# Patient Record
Sex: Female | Born: 1945 | Race: Black or African American | Hispanic: No | Smoking: Former smoker
Health system: Southern US, Community
[De-identification: ages and names within clinical notes are randomized; demographics above are authoritative.]

## PROBLEM LIST (undated history)

## (undated) DIAGNOSIS — IMO0001 Reserved for inherently not codable concepts without codable children: Secondary | ICD-10-CM

## (undated) DIAGNOSIS — K219 Gastro-esophageal reflux disease without esophagitis: Secondary | ICD-10-CM

## (undated) DIAGNOSIS — I10 Essential (primary) hypertension: Secondary | ICD-10-CM

## (undated) DIAGNOSIS — I209 Angina pectoris, unspecified: Secondary | ICD-10-CM

## (undated) DIAGNOSIS — Z87898 Personal history of other specified conditions: Secondary | ICD-10-CM

## (undated) DIAGNOSIS — J45909 Unspecified asthma, uncomplicated: Secondary | ICD-10-CM

## (undated) HISTORY — PX: CARDIAC CATHETERIZATION: SHX172

## (undated) HISTORY — PX: EYE SURGERY: SHX253

## (undated) HISTORY — PX: TUBAL LIGATION: SHX77

---

## 2004-04-06 ENCOUNTER — Ambulatory Visit: Payer: Self-pay | Admitting: Cardiology

## 2005-04-21 ENCOUNTER — Ambulatory Visit: Payer: Self-pay

## 2006-01-08 ENCOUNTER — Emergency Department: Payer: Self-pay | Admitting: General Practice

## 2006-01-08 ENCOUNTER — Other Ambulatory Visit: Payer: Self-pay

## 2006-04-13 ENCOUNTER — Emergency Department: Payer: Self-pay | Admitting: Emergency Medicine

## 2006-05-11 ENCOUNTER — Ambulatory Visit: Payer: Self-pay

## 2007-05-16 ENCOUNTER — Ambulatory Visit: Payer: Self-pay

## 2008-04-22 ENCOUNTER — Emergency Department: Payer: Self-pay | Admitting: Emergency Medicine

## 2008-05-15 ENCOUNTER — Ambulatory Visit: Payer: Self-pay

## 2009-06-25 ENCOUNTER — Ambulatory Visit: Payer: Self-pay

## 2010-07-01 ENCOUNTER — Ambulatory Visit: Payer: Self-pay

## 2010-07-14 ENCOUNTER — Ambulatory Visit: Payer: Self-pay

## 2011-06-29 ENCOUNTER — Ambulatory Visit: Payer: Self-pay

## 2012-07-25 ENCOUNTER — Ambulatory Visit: Payer: Self-pay

## 2012-10-12 ENCOUNTER — Ambulatory Visit: Payer: Self-pay | Admitting: Unknown Physician Specialty

## 2013-07-18 ENCOUNTER — Ambulatory Visit: Payer: Self-pay | Admitting: Ophthalmology

## 2013-07-18 LAB — CBC WITH DIFFERENTIAL/PLATELET
BASOS PCT: 1.1 %
Basophil #: 0.1 10*3/uL (ref 0.0–0.1)
EOS PCT: 5.4 %
Eosinophil #: 0.4 10*3/uL (ref 0.0–0.7)
HCT: 41.7 % (ref 35.0–47.0)
HGB: 13.4 g/dL (ref 12.0–16.0)
LYMPHS ABS: 1.8 10*3/uL (ref 1.0–3.6)
Lymphocyte %: 25.9 %
MCH: 28.6 pg (ref 26.0–34.0)
MCHC: 32 g/dL (ref 32.0–36.0)
MCV: 89 fL (ref 80–100)
MONO ABS: 0.5 x10 3/mm (ref 0.2–0.9)
MONOS PCT: 7.8 %
Neutrophil #: 4.1 10*3/uL (ref 1.4–6.5)
Neutrophil %: 59.8 %
Platelet: 227 10*3/uL (ref 150–440)
RBC: 4.67 10*6/uL (ref 3.80–5.20)
RDW: 15 % — AB (ref 11.5–14.5)
WBC: 6.8 10*3/uL (ref 3.6–11.0)

## 2013-07-18 LAB — POTASSIUM: POTASSIUM: 3.5 mmol/L (ref 3.5–5.1)

## 2013-07-25 ENCOUNTER — Ambulatory Visit: Payer: Self-pay | Admitting: Ophthalmology

## 2013-08-23 ENCOUNTER — Ambulatory Visit: Payer: Self-pay

## 2014-07-20 NOTE — Op Note (Signed)
PATIENT NAME:  Monica Stewart, Monica Stewart MR#:  [id number] DATE OF BIRTH:  08/05/1945  DATE OF PROCEDURE:  07/25/2013  PROCEDURES PERFORMED: 1. Pars plana vitrectomy of the right eye.  2. Internal limiting membrane peel of the right eye.  3. Gas exchange of the right eye.   PREOPERATIVE DIAGNOSIS: Full-thickness macular hole.   POSTOPERATIVE DIAGNOSIS: Full-thickness macular hole.   ESTIMATED BLOOD LOSS: Less than 1 mL.   PRIMARY SURGEON: Coralee Rud, M.D.   ANESTHESIA: Retrobulbar block of the right eye with monitored anesthesia care.   COMPLICATIONS: None.   INDICATIONS FOR PROCEDURE: This is a patient who presented to my office with decreased vision in the right eye. Examination revealed a full thickness macular hole. Risks, benefits, and alternatives including infection, hemorrhage and retinal detachment were discussed with the patient. The patient wished to proceed.   DETAILS OF PROCEDURE: After informed consent was obtained, the patient was brought into the operative suite at [facility]. The patient was placed in supine position, was given a small dose of Alfenta and a retrobulbar block was performed on the right eye by the primary surgeon without any complications. The right eye was prepped and draped in sterile manner. After lid speculum was inserted, a 25-gauge trocar was placed inferotemporally through displaced conjunctiva in an oblique fashion 4 mm beyond the limbus. The infusion cannula was turned on and inserted through the trocar and secured in position with Steri-Strips. Two more trocars were placed in a similar fashion superotemporally and superonasally. Vitreous cutter and light pipe were introduced in the eye and a core vitrectomy was performed. The vitreous base was elevated off of the retina using suction. The vitreous face was removed and the peripheral vitreous was trimmed for 360 degrees with care taken to avoid hitting the crystalline lens.  Indocyanine green was injected onto the posterior pole and removed within 30 seconds. A complete internal limiting membrane peel was completed for 360 degrees around the fovea for a total diameter of two disk diameters. A scleral depressed exam was performed and no signs of any breaks, tears, or retinal detachment could be identified for 360 degrees. A complete air-fluid exchange was performed. Four minutes was allowed to elapse for dehydration of the vitreous base. This remnant fluid was then removed. An air gas exchange was performed with 20% SF6. The trocars were removed and two of them were noted to be leaky. These were closed using transconjunctival  6-0 plain gut sutures. The eye was pressurized with 20% SF6 to a pressure of 15 mmHg. Dexamethasone 5 mg was given into the inferior fornix and the lid speculum was removed. The eye was cleaned and TobraDex was placed in the eye. A patch and shield were placed over the eye and the patient was taken to postanesthesia care with instructions to remain face down.     ____________________________ Teresa Pelton. Starling Manns, MD mfa:sg D: 07/25/2013 08:16:59 ET T: 07/25/2013 08:41:21 ET JOB#: [id number]  cc: Teresa Pelton. Starling Manns, MD, <Dictator> Coralee Rud MD ELECTRONICALLY SIGNED 08/22/2013 9:32

## 2014-07-24 ENCOUNTER — Other Ambulatory Visit: Payer: Self-pay

## 2014-07-24 DIAGNOSIS — Z1231 Encounter for screening mammogram for malignant neoplasm of breast: Secondary | ICD-10-CM

## 2014-08-27 ENCOUNTER — Ambulatory Visit
Admission: RE | Admit: 2014-08-27 | Discharge: 2014-08-27 | Disposition: A | Discharge status: Home / Self Care | Payer: BLUE CROSS/BLUE SHIELD | Source: Ambulatory Visit | Attending: Infectious Diseases | Admitting: Infectious Diseases

## 2014-08-27 DIAGNOSIS — Z1231 Encounter for screening mammogram for malignant neoplasm of breast: Secondary | ICD-10-CM | POA: Insufficient documentation

## 2015-01-20 ENCOUNTER — Encounter: Payer: Self-pay | Admitting: *Deleted

## 2015-01-23 ENCOUNTER — Ambulatory Visit: Payer: BLUE CROSS/BLUE SHIELD | Admitting: Certified Registered Nurse Anesthetist

## 2015-01-23 ENCOUNTER — Encounter: Payer: Self-pay | Admitting: *Deleted

## 2015-01-23 ENCOUNTER — Ambulatory Visit
Admission: RE | Admit: 2015-01-23 | Discharge: 2015-01-23 | Disposition: A | Discharge status: Home / Self Care | Payer: BLUE CROSS/BLUE SHIELD | Source: Ambulatory Visit | Attending: Ophthalmology | Admitting: Ophthalmology

## 2015-01-23 ENCOUNTER — Encounter
Admission: RE | Disposition: A | Discharge status: Home / Self Care | Payer: Self-pay | Source: Ambulatory Visit | Attending: Ophthalmology

## 2015-01-23 DIAGNOSIS — R0602 Shortness of breath: Secondary | ICD-10-CM | POA: Diagnosis not present

## 2015-01-23 DIAGNOSIS — R0601 Orthopnea: Secondary | ICD-10-CM | POA: Insufficient documentation

## 2015-01-23 DIAGNOSIS — I209 Angina pectoris, unspecified: Secondary | ICD-10-CM | POA: Diagnosis not present

## 2015-01-23 DIAGNOSIS — J45909 Unspecified asthma, uncomplicated: Secondary | ICD-10-CM | POA: Diagnosis not present

## 2015-01-23 DIAGNOSIS — K219 Gastro-esophageal reflux disease without esophagitis: Secondary | ICD-10-CM | POA: Diagnosis not present

## 2015-01-23 DIAGNOSIS — H2511 Age-related nuclear cataract, right eye: Secondary | ICD-10-CM | POA: Insufficient documentation

## 2015-01-23 DIAGNOSIS — I10 Essential (primary) hypertension: Secondary | ICD-10-CM | POA: Diagnosis not present

## 2015-01-23 DIAGNOSIS — Z87891 Personal history of nicotine dependence: Secondary | ICD-10-CM | POA: Insufficient documentation

## 2015-01-23 DIAGNOSIS — R011 Cardiac murmur, unspecified: Secondary | ICD-10-CM | POA: Diagnosis not present

## 2015-01-23 DIAGNOSIS — J40 Bronchitis, not specified as acute or chronic: Secondary | ICD-10-CM | POA: Insufficient documentation

## 2015-01-23 HISTORY — DX: Angina pectoris, unspecified: I20.9

## 2015-01-23 HISTORY — DX: Gastro-esophageal reflux disease without esophagitis: K21.9

## 2015-01-23 HISTORY — PX: CATARACT EXTRACTION W/PHACO: SHX586

## 2015-01-23 HISTORY — DX: Unspecified asthma, uncomplicated: J45.909

## 2015-01-23 HISTORY — DX: Personal history of other specified conditions: Z87.898

## 2015-01-23 HISTORY — DX: Reserved for inherently not codable concepts without codable children: IMO0001

## 2015-01-23 SURGERY — PHACOEMULSIFICATION, CATARACT, WITH IOL INSERTION
Anesthesia: Monitor Anesthesia Care | Laterality: Right

## 2015-01-23 MED ORDER — PHENYLEPHRINE HCL 10 % OP SOLN
1.0000 [drp] | OPHTHALMIC | Status: AC | PRN
  Administered 2015-01-23 (×4): 1 [drp] via OPHTHALMIC

## 2015-01-23 MED ORDER — LIDOCAINE HCL (PF) 4 % IJ SOLN
INTRAMUSCULAR | Status: AC
  Filled 2015-01-23: qty 5

## 2015-01-23 MED ORDER — TETRACAINE HCL 0.5 % OP SOLN
1.0000 [drp] | OPHTHALMIC | Status: DC | PRN
  Administered 2015-01-23: 1 [drp] via OPHTHALMIC

## 2015-01-23 MED ORDER — TETRACAINE HCL 0.5 % OP SOLN
OPHTHALMIC | Status: AC
  Administered 2015-01-23: 1 [drp] via OPHTHALMIC
  Filled 2015-01-23: qty 2

## 2015-01-23 MED ORDER — NA HYALUR & NA CHOND-NA HYALUR 0.55-0.5 ML IO KIT
PACK | INTRAOCULAR | Status: AC
  Filled 2015-01-23: qty 1.05

## 2015-01-23 MED ORDER — CEFUROXIME OPHTHALMIC INJECTION 1 MG/0.1 ML
INJECTION | OPHTHALMIC | Status: DC | PRN
  Administered 2015-01-23: 0.1 mL via INTRACAMERAL

## 2015-01-23 MED ORDER — MIDAZOLAM HCL 2 MG/2ML IJ SOLN
INTRAMUSCULAR | Status: DC | PRN
  Administered 2015-01-23: 1 mg via INTRAVENOUS

## 2015-01-23 MED ORDER — SODIUM CHLORIDE 0.9 % IV SOLN
INTRAVENOUS | Status: DC
  Administered 2015-01-23: 08:00:00 via INTRAVENOUS

## 2015-01-23 MED ORDER — CYCLOPENTOLATE HCL 2 % OP SOLN
OPHTHALMIC | Status: AC
  Administered 2015-01-23: 1 [drp] via OPHTHALMIC
  Filled 2015-01-23: qty 2

## 2015-01-23 MED ORDER — CYCLOPENTOLATE HCL 2 % OP SOLN
1.0000 [drp] | OPHTHALMIC | Status: AC | PRN
Start: 2015-01-23 — End: 2015-01-23
  Administered 2015-01-23 (×4): 1 [drp] via OPHTHALMIC

## 2015-01-23 MED ORDER — BALANCED SALT IO SOLN
INTRAOCULAR | Status: DC | PRN
  Administered 2015-01-23: 200 mL via INTRAOCULAR

## 2015-01-23 MED ORDER — MOXIFLOXACIN HCL 0.5 % OP SOLN
1.0000 [drp] | OPHTHALMIC | Status: AC | PRN
  Administered 2015-01-23 (×3): 1 [drp] via OPHTHALMIC

## 2015-01-23 MED ORDER — PHENYLEPHRINE HCL 10 % OP SOLN
OPHTHALMIC | Status: AC
  Administered 2015-01-23: 1 [drp] via OPHTHALMIC
  Filled 2015-01-23: qty 5

## 2015-01-23 MED ORDER — MOXIFLOXACIN HCL 0.5 % OP SOLN
OPHTHALMIC | Status: AC
  Administered 2015-01-23: 1 [drp] via OPHTHALMIC
  Filled 2015-01-23: qty 3

## 2015-01-23 MED ORDER — FENTANYL CITRATE (PF) 100 MCG/2ML IJ SOLN
INTRAMUSCULAR | Status: DC | PRN
  Administered 2015-01-23 (×2): 25 ug via INTRAVENOUS

## 2015-01-23 MED ORDER — LIDOCAINE HCL (PF) 4 % IJ SOLN
INTRAOCULAR | Status: DC | PRN
  Administered 2015-01-23: .5 mL via OPHTHALMIC

## 2015-01-23 MED ORDER — MOXIFLOXACIN HCL 0.5 % OP SOLN
OPHTHALMIC | Status: DC | PRN
  Administered 2015-01-23: 2 [drp] via OPHTHALMIC

## 2015-01-23 MED ORDER — EPINEPHRINE HCL 1 MG/ML IJ SOLN
INTRAMUSCULAR | Status: AC
  Filled 2015-01-23: qty 1

## 2015-01-23 MED ORDER — NA HYALUR & NA CHOND-NA HYALUR 0.4-0.35 ML IO KIT
PACK | INTRAOCULAR | Status: DC | PRN
  Administered 2015-01-23: 1 mL via INTRAOCULAR

## 2015-01-23 SURGICAL SUPPLY — 22 items
CANNULA ANT/CHMB 27GA (MISCELLANEOUS) ×3 IMPLANT
CUP MEDICINE 2OZ PLAST GRAD ST (MISCELLANEOUS) ×3 IMPLANT
GLOVE BIO SURGEON STRL SZ7 (GLOVE) ×3 IMPLANT
GLOVE SURG LX 6.5 MICRO (GLOVE) ×4
GLOVE SURG LX STRL 6.5 MICRO (GLOVE) ×2 IMPLANT
GOWN STRL REUS W/ TWL LRG LVL3 (GOWN DISPOSABLE) ×2 IMPLANT
GOWN STRL REUS W/TWL LRG LVL3 (GOWN DISPOSABLE) ×4
LENS IOL ACRSF IQ PC 23.5 (Intraocular Lens) ×1 IMPLANT
LENS IOL ACRYSOF IQ POST 23.5 (Intraocular Lens) ×3 IMPLANT
NEEDLE FILTER BLUNT 18X 1/2SAF (NEEDLE) ×2
NEEDLE FILTER BLUNT 18X1 1/2 (NEEDLE) ×1 IMPLANT
PACK CATARACT (MISCELLANEOUS) ×3 IMPLANT
PACK CATARACT BRASINGTON LX (MISCELLANEOUS) ×3 IMPLANT
PACK EYE AFTER SURG (MISCELLANEOUS) ×3 IMPLANT
SOL BSS BAG (MISCELLANEOUS) ×3
SOL PREP PVP 2OZ (MISCELLANEOUS) ×3
SOLUTION BSS BAG (MISCELLANEOUS) ×1 IMPLANT
SOLUTION PREP PVP 2OZ (MISCELLANEOUS) ×1 IMPLANT
SYR 3ML LL SCALE MARK (SYRINGE) ×6 IMPLANT
SYR TB 1ML 27GX1/2 LL (SYRINGE) ×3 IMPLANT
WATER STERILE IRR 1000ML POUR (IV SOLUTION) ×3 IMPLANT
WIPE NON LINTING 3.25X3.25 (MISCELLANEOUS) ×3 IMPLANT

## 2015-01-23 NOTE — Op Note (Signed)
  01/23/2015  PRE-OP DIAGNOSIS: Cataract (ICD-10 H25.11) Nuclear sclerotic catarct, RIGHT EYE  Post operative diagnosis: Cataract (ICD-10 H25.11) Nuclear sclerotic cataract, RIGHT EYE  Procedure: Phacoemulsification with introcular lens [id number]([id number])   SURGEON: Surgeon(s) and Role:    * Lyla Glassing, MD - Primary  ANESTHESIA: Choice   ESTIMATED BLOOD LOSS: MINIMAL  COMPLICATIONS: None  OPERATIVE DESCRIPTION:   Therapeutic options were discussed with the patient preoperatively, including a discussion of risks and benefits of surgery.  Informed consent was obtained. A dilated fundus exam was performed within 6 months.   The patient was premedicated and brought to the operating room and placed on the operating table in the supine position.  Topical tetracaine was instilled.  After adequate anesthesia, the patient was prepped and draped in the usual fashion.  A wire lid speculum was inserted and the microscope was positioned.  A sideport was used to create a paracentesis site and a mixture of preservative-free lidocaine, BSS, and epinephrine was was instilled into the anterior chamber, followed by viscoelastic.  A clear corneal incision was created using a keratome blade.  Capsulorrhexis was then performed.  In situ phacoemulsification was performed.  Cortical material was removed with the irrigation-aspiration unit.  Viscoelastic was instilled to open the capsular bag.  A posterior chamber intraocular lens, model 23.5 diopters, was inserted and positioned.  Irrigation-aspiration was used to remove all viscoelastic. Intracameral cefuroxime was injected into the eye. Wounds were checked for leakage and confirmed to be secure.  Lid speculum was removed and a shield was placed over the eye.  Patient was returned to the recovery room in stable condition. IMPLANTS:   Implant Name Type Inv. Item Serial No. Manufacturer Lot No. LRB No. Used  IMPLANT LENS - [id number] Intraocular Lens IMPLANT LENS  [id number] ALCON   Right 1     Postoperative care and discharge medication counseling was discussed with the patient or the parents prior to discharge

## 2015-01-23 NOTE — Discharge Instructions (Signed)
Eye Surgery Discharge Instructions  Expect mild scratchy sensation or mild soreness. DO NOT RUB YOUR EYE!  The day of surgery:  Minimal physical activity, but bed rest is not required  No reading, computer work, or close hand work  No bending, lifting, or straining.  May watch TV  For 24 hours:  No driving, legal decisions, or alcoholic beverages  Safety precautions  Eat anything you prefer: It is better to start with liquids, then soup then solid foods.  _____ Eye patch should be worn until postoperative exam tomorrow.  ____ Solar shield eyeglasses should be worn for comfort in the sunlight/patch while sleeping  Resume all regular medications including aspirin or Coumadin if these were discontinued prior to surgery. You may shower, bathe, shave, or wash your hair. Tylenol may be taken for mild discomfort.  Call your doctor if you experience significant pain, nausea, or vomiting, fever > 101 or other signs of infection. [phone] or [phone] Specific instructions:  Follow-up Information    Follow up with Lyla Glassing, MD In 1 day.   Specialty:  Ophthalmology   Why:  For wound re-check @ 9:40   Contact information:   [address] [place] [address] [phone]

## 2015-01-23 NOTE — Anesthesia Procedure Notes (Addendum)
Procedure Name: MAC Performed by: Demetrius Charity Pre-anesthesia Checklist: Patient identified, Suction available, Emergency Drugs available, Patient being monitored and Timeout performed Preoxygenation: O2 via blowby-pt states that she cannot breathe with nasal cannula.

## 2015-01-23 NOTE — H&P (Signed)
The history and physical was faxed to the hospital. The history and physical was reviewed by me and no changes have occurred.   

## 2015-01-23 NOTE — Anesthesia Postprocedure Evaluation (Signed)
  Anesthesia Post-op Note  Patient: [name]  Procedure(s) Performed: Procedure(s) with comments: CATARACT EXTRACTION PHACO AND INTRAOCULAR LENS PLACEMENT (IOC) (Right) - [place]            1.09 AP             17.7 CDE         12.16 casette lot # [id number]  Anesthesia type:MAC  Patient location: PACU  Post pain: Pain level controlled  Post assessment: Post-op Vital signs reviewed, Patient's Cardiovascular Status Stable, Respiratory Function Stable, Patent Airway and No signs of Nausea or vomiting  Post vital signs: Reviewed and stable  Last Vitals:  Filed Vitals:   01/23/15 1034  BP: 151/90  Pulse: 92  Temp: 36.8 C  Resp: 16    Level of consciousness: awake, alert  and patient cooperative  Complications: No apparent anesthesia complications

## 2015-01-23 NOTE — Anesthesia Postprocedure Evaluation (Signed)
  Anesthesia Post-op Note  Patient: LEILANIE RAUDA  Procedure(s) Performed: Procedure(s) with comments: CATARACT EXTRACTION PHACO AND INTRAOCULAR LENS PLACEMENT (IOC) (Right) - [place]            1.09 AP             17.7 CDE         12.16 casette lot # [id number]  Anesthesia type:MAC  Patient location: PACU  Post pain: Pain level controlled  Post assessment: Post-op Vital signs reviewed, Patient's Cardiovascular Status Stable, Respiratory Function Stable, Patent Airway and No signs of Nausea or vomiting  Post vital signs: Reviewed and stable  Last Vitals:  Filed Vitals:   01/23/15 1034  BP: 151/90  Pulse: 92  Temp: 36.8 C  Resp: 16    Level of consciousness: awake, alert  and patient cooperative  Complications: No apparent anesthesia complications

## 2015-01-23 NOTE — Transfer of Care (Signed)
Immediate Anesthesia Transfer of Care Note  Patient: Monica Stewart  Procedure(s) Performed: Procedure(s) with comments: CATARACT EXTRACTION PHACO AND INTRAOCULAR LENS PLACEMENT (IOC) (Right) - [place]            1.09 AP             17.7 CDE         12.16 casette lot # [id number]  Patient Location: PACU  Anesthesia Type:MAC  Level of Consciousness: awake, alert  and oriented  Airway & Oxygen Therapy: Patient Spontanous Breathing  Post-op Assessment: Report given to RN and Post -op Vital signs reviewed and stable  Post vital signs: Reviewed and stable  Last Vitals:  Filed Vitals:   01/23/15 1006  BP: 163/90  Pulse: 89  Temp: 36.8 C  Resp: 16    Complications: No apparent anesthesia complications

## 2015-01-23 NOTE — Anesthesia Preprocedure Evaluation (Signed)
Anesthesia Evaluation  Patient identified by MRN, date of birth, ID band Patient awake    Airway Mallampati: I  TM Distance: >3 FB     Dental  (+) Lower Dentures, Upper Dentures   Pulmonary shortness of breath and with exertion, asthma , former smoker,    breath sounds clear to auscultation       Cardiovascular  Rhythm:Regular Rate:Normal     Neuro/Psych    GI/Hepatic Medicated,  Endo/Other    Renal/GU      Musculoskeletal   Abdominal (+)  Abdomen: soft.    Peds  Hematology   Anesthesia Other Findings   Reproductive/Obstetrics                             Anesthesia Physical Anesthesia Plan  ASA: II  Anesthesia Plan: MAC   Post-op Pain Management:    Induction:   Airway Management Planned: Nasal Cannula  Additional Equipment:   Intra-op Plan:   Post-operative Plan:   Informed Consent: I have reviewed the patients History and Physical, chart, labs and discussed the procedure including the risks, benefits and alternatives for the proposed anesthesia with the patient or authorized representative who has indicated his/her understanding and acceptance.     Plan Discussed with: CRNA and Surgeon  Anesthesia Plan Comments:         Anesthesia Quick Evaluation

## 2015-07-16 ENCOUNTER — Other Ambulatory Visit: Payer: Self-pay | Admitting: Infectious Diseases

## 2015-07-16 DIAGNOSIS — Z1231 Encounter for screening mammogram for malignant neoplasm of breast: Secondary | ICD-10-CM

## 2015-09-02 ENCOUNTER — Other Ambulatory Visit: Payer: Self-pay | Admitting: Infectious Diseases

## 2015-09-02 ENCOUNTER — Ambulatory Visit
Admission: RE | Admit: 2015-09-02 | Discharge: 2015-09-02 | Disposition: A | Discharge status: Home / Self Care | Payer: BLUE CROSS/BLUE SHIELD | Source: Ambulatory Visit | Attending: Infectious Diseases | Admitting: Infectious Diseases

## 2015-09-02 DIAGNOSIS — Z1231 Encounter for screening mammogram for malignant neoplasm of breast: Secondary | ICD-10-CM | POA: Insufficient documentation

## 2015-09-02 DIAGNOSIS — R928 Other abnormal and inconclusive findings on diagnostic imaging of breast: Secondary | ICD-10-CM | POA: Diagnosis not present

## 2015-09-08 ENCOUNTER — Other Ambulatory Visit: Payer: Self-pay | Admitting: Infectious Diseases

## 2015-09-08 DIAGNOSIS — R928 Other abnormal and inconclusive findings on diagnostic imaging of breast: Secondary | ICD-10-CM

## 2015-09-16 ENCOUNTER — Ambulatory Visit
Admission: RE | Admit: 2015-09-16 | Discharge: 2015-09-16 | Disposition: A | Discharge status: Home / Self Care | Payer: BLUE CROSS/BLUE SHIELD | Source: Ambulatory Visit | Attending: Infectious Diseases | Admitting: Infectious Diseases

## 2015-09-16 DIAGNOSIS — N63 Unspecified lump in breast: Secondary | ICD-10-CM | POA: Diagnosis present

## 2015-09-16 DIAGNOSIS — R928 Other abnormal and inconclusive findings on diagnostic imaging of breast: Secondary | ICD-10-CM

## 2015-09-17 ENCOUNTER — Other Ambulatory Visit: Payer: Self-pay | Admitting: Infectious Diseases

## 2015-09-17 DIAGNOSIS — N6320 Unspecified lump in the left breast, unspecified quadrant: Secondary | ICD-10-CM

## 2015-09-23 ENCOUNTER — Ambulatory Visit
Admission: RE | Admit: 2015-09-23 | Discharge: 2015-09-23 | Disposition: A | Discharge status: Home / Self Care | Payer: BLUE CROSS/BLUE SHIELD | Source: Ambulatory Visit | Attending: Infectious Diseases | Admitting: Infectious Diseases

## 2015-09-23 DIAGNOSIS — N6082 Other benign mammary dysplasias of left breast: Secondary | ICD-10-CM | POA: Diagnosis not present

## 2015-09-23 DIAGNOSIS — N63 Unspecified lump in breast: Secondary | ICD-10-CM | POA: Diagnosis present

## 2015-09-23 DIAGNOSIS — N6320 Unspecified lump in the left breast, unspecified quadrant: Secondary | ICD-10-CM

## 2015-09-23 HISTORY — PX: BREAST BIOPSY: SHX20

## 2015-09-24 LAB — SURGICAL PATHOLOGY

## 2016-07-12 ENCOUNTER — Other Ambulatory Visit: Payer: Self-pay | Admitting: Infectious Diseases

## 2016-07-12 DIAGNOSIS — R1032 Left lower quadrant pain: Secondary | ICD-10-CM

## 2016-08-10 ENCOUNTER — Other Ambulatory Visit: Payer: Self-pay | Admitting: Infectious Diseases

## 2016-08-10 DIAGNOSIS — Z1231 Encounter for screening mammogram for malignant neoplasm of breast: Secondary | ICD-10-CM

## 2016-09-03 ENCOUNTER — Ambulatory Visit
Admission: RE | Admit: 2016-09-03 | Discharge: 2016-09-03 | Disposition: A | Discharge status: Home / Self Care | Payer: BLUE CROSS/BLUE SHIELD | Source: Ambulatory Visit | Attending: Infectious Diseases | Admitting: Infectious Diseases

## 2016-09-03 DIAGNOSIS — Z1231 Encounter for screening mammogram for malignant neoplasm of breast: Secondary | ICD-10-CM | POA: Insufficient documentation

## 2017-07-20 ENCOUNTER — Other Ambulatory Visit: Payer: Self-pay | Admitting: Infectious Diseases

## 2017-07-20 DIAGNOSIS — G8929 Other chronic pain: Secondary | ICD-10-CM

## 2017-07-20 DIAGNOSIS — R1032 Left lower quadrant pain: Principal | ICD-10-CM

## 2017-08-16 IMAGING — MG MM DIGITAL SCREENING BILAT W/ TOMO W/ CAD
9 of 12 series · 9 of 28 positions shown · non-contrast
Comparison: Previous exam(s).

CLINICAL DATA: Screening.

EXAM:
2D DIGITAL SCREENING BILATERAL MAMMOGRAM WITH CAD AND ADJUNCT TOMO

[L MLO synth-2D]
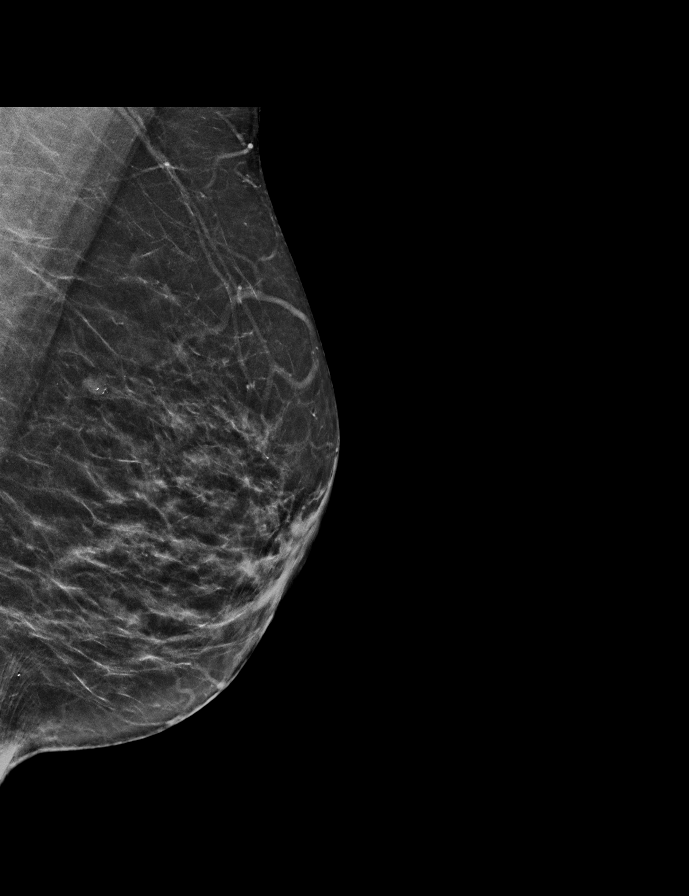

[L MLO]
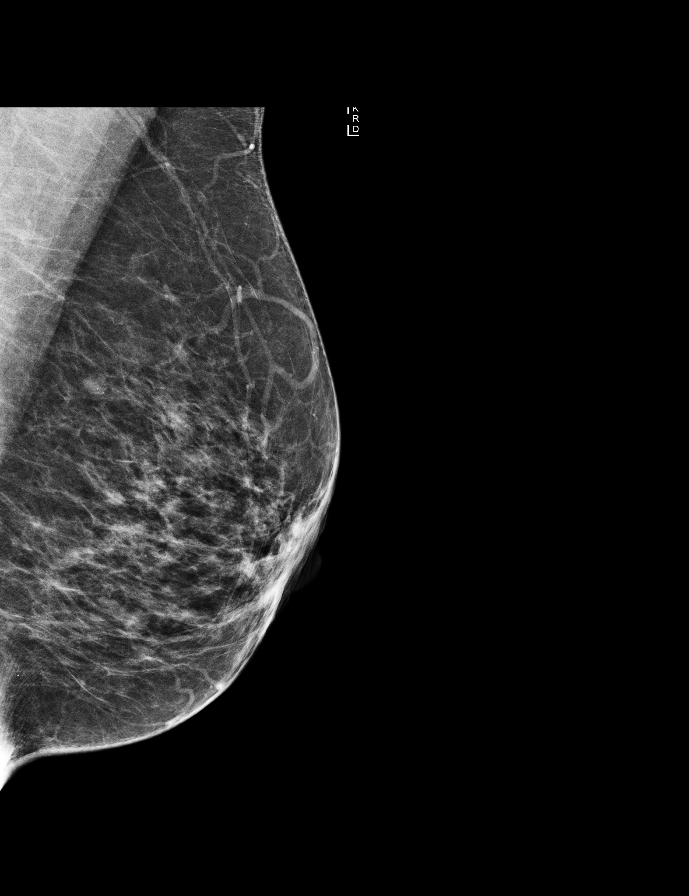

[L CC synth-2D]
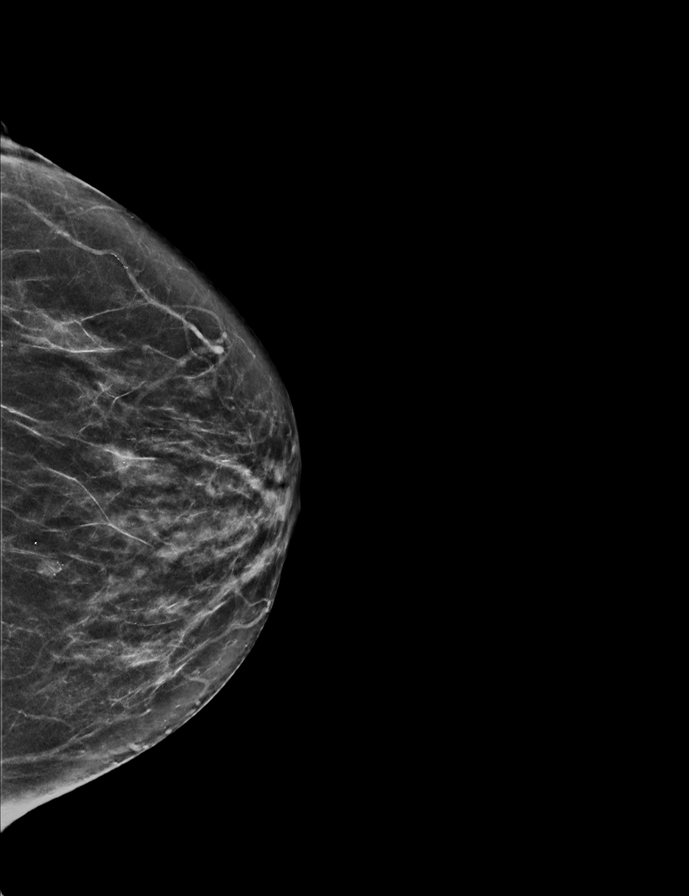

[R MLO synth-2D]
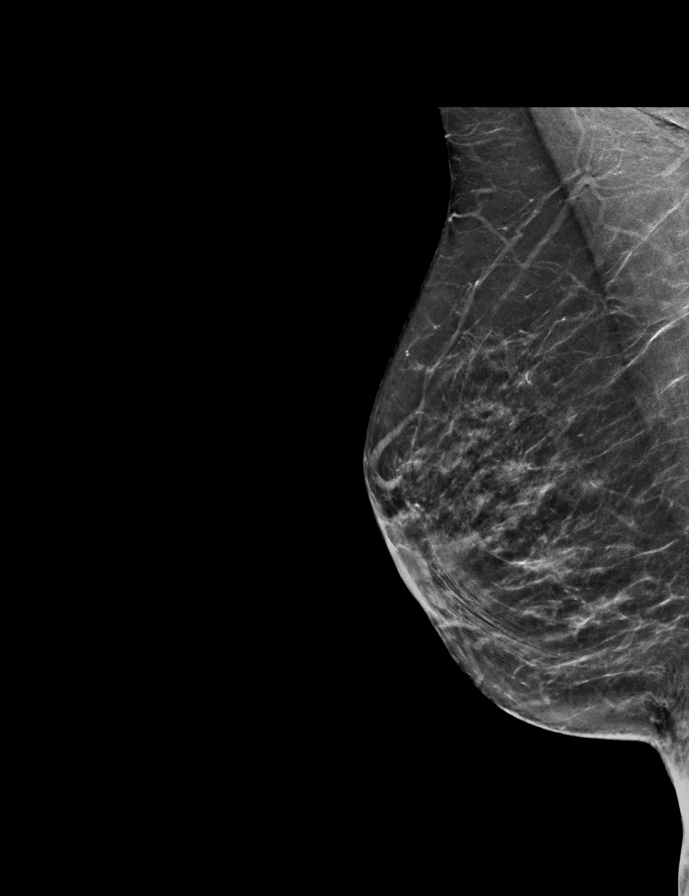

[R CC]
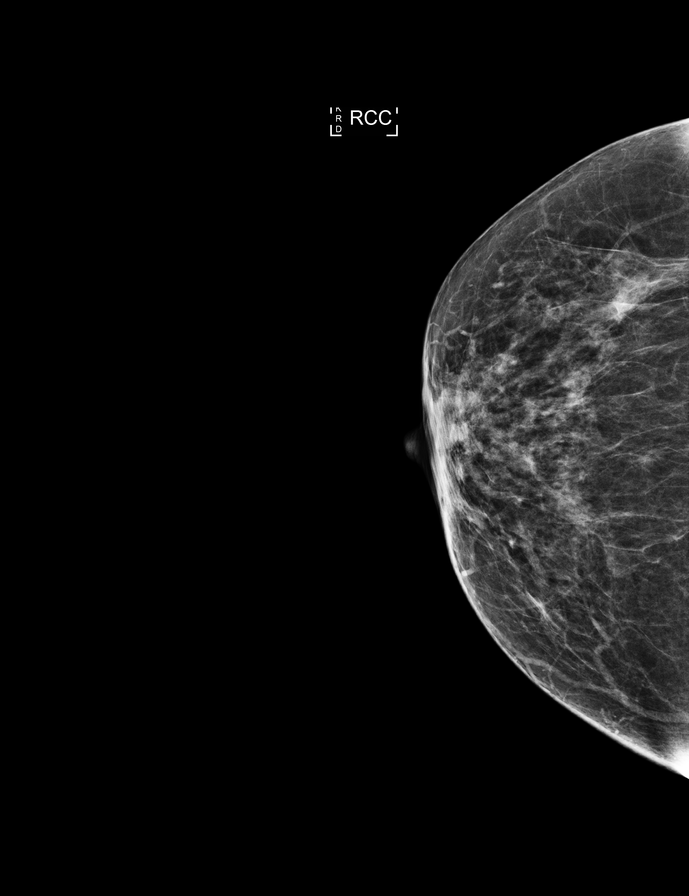

[R CC synth-2D]
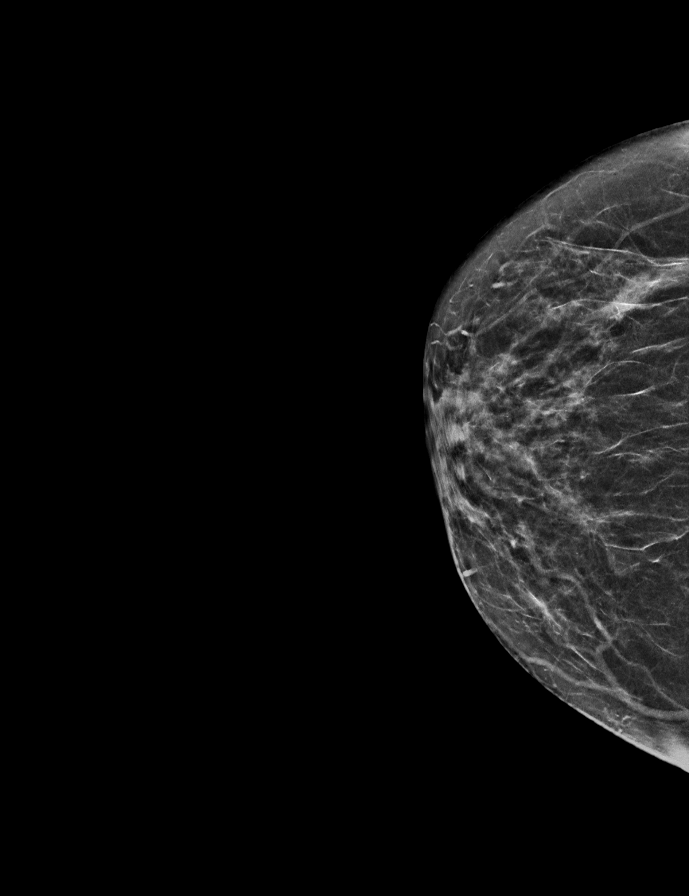

[R MLO]
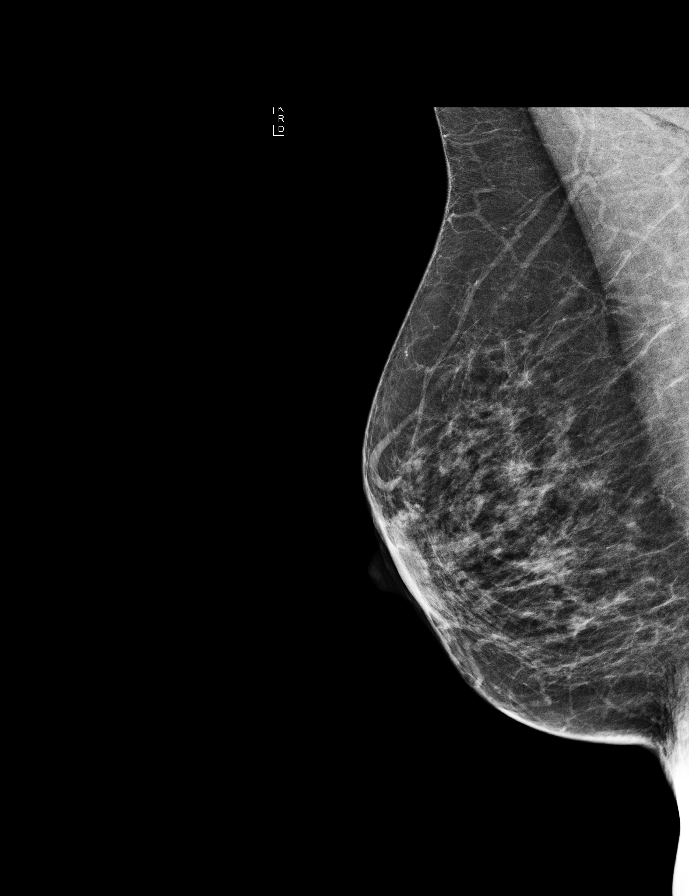

[L CC]
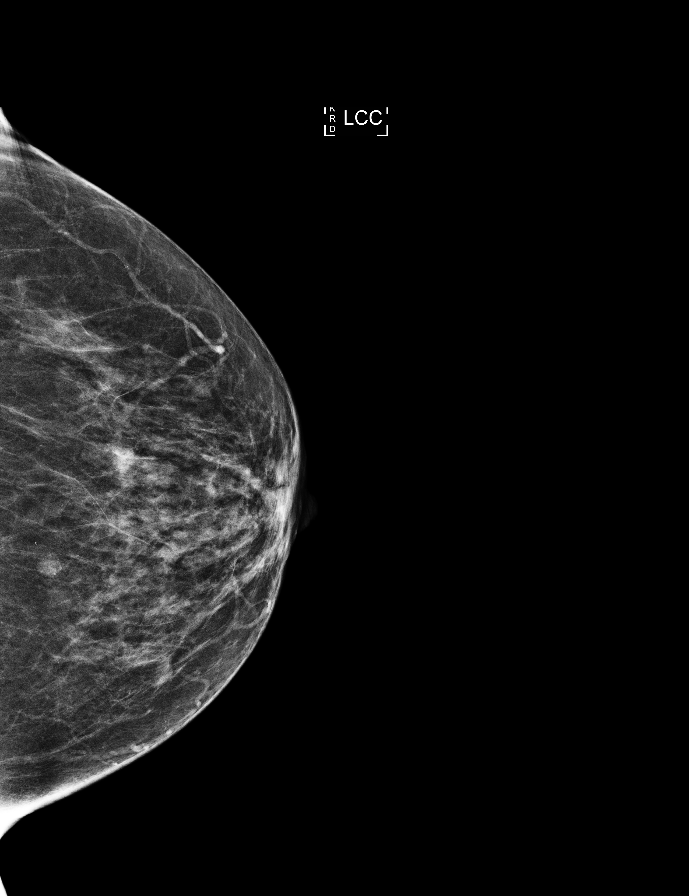

[L MLO tomo · tomo slice 27/54.0]
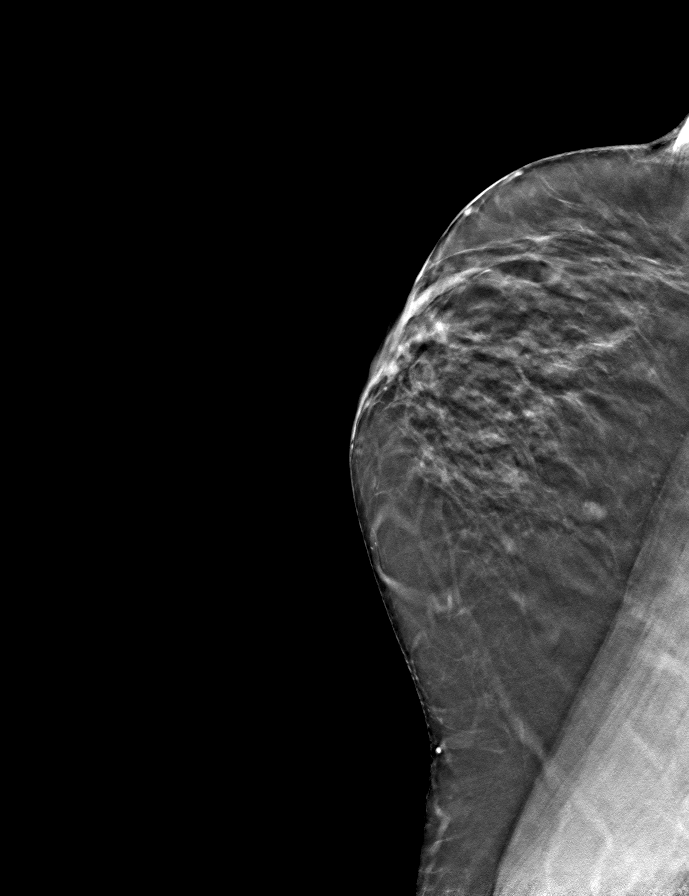

[9 of 28 positions shown; findings below may reference images not displayed]

ACR Breast Density Category b: There are scattered areas of
fibroglandular density.
FINDINGS: In the left breast, a possible mass warrants further evaluation. In
the right breast, no findings suspicious for malignancy.

Images were processed with CAD.
IMPRESSION: Further evaluation is suggested for possible mass in the left
breast.

RECOMMENDATION:
Diagnostic mammogram and possibly ultrasound of the left breast.
(Code:[id number])

The patient will be contacted regarding the findings, and additional
imaging will be scheduled.

BI-RADS CATEGORY  0: Incomplete. Need additional imaging evaluation
and/or prior mammograms for comparison.

## 2017-08-29 ENCOUNTER — Other Ambulatory Visit: Payer: Self-pay | Admitting: Infectious Diseases

## 2017-08-29 DIAGNOSIS — Z1231 Encounter for screening mammogram for malignant neoplasm of breast: Secondary | ICD-10-CM

## 2017-09-22 ENCOUNTER — Ambulatory Visit
Admission: RE | Admit: 2017-09-22 | Discharge: 2017-09-22 | Disposition: A | Discharge status: Home / Self Care | Payer: BLUE CROSS/BLUE SHIELD | Source: Ambulatory Visit | Attending: Infectious Diseases | Admitting: Infectious Diseases

## 2017-09-22 DIAGNOSIS — Z1231 Encounter for screening mammogram for malignant neoplasm of breast: Secondary | ICD-10-CM | POA: Diagnosis present

## 2017-09-26 ENCOUNTER — Other Ambulatory Visit: Payer: Self-pay | Admitting: Infectious Diseases

## 2017-09-26 DIAGNOSIS — R928 Other abnormal and inconclusive findings on diagnostic imaging of breast: Secondary | ICD-10-CM

## 2017-09-26 DIAGNOSIS — N6310 Unspecified lump in the right breast, unspecified quadrant: Secondary | ICD-10-CM

## 2017-10-05 ENCOUNTER — Ambulatory Visit
Admission: RE | Admit: 2017-10-05 | Discharge: 2017-10-05 | Disposition: A | Discharge status: Home / Self Care | Payer: BLUE CROSS/BLUE SHIELD | Source: Ambulatory Visit | Attending: Infectious Diseases | Admitting: Infectious Diseases

## 2017-10-05 DIAGNOSIS — R928 Other abnormal and inconclusive findings on diagnostic imaging of breast: Secondary | ICD-10-CM

## 2017-10-05 DIAGNOSIS — N6310 Unspecified lump in the right breast, unspecified quadrant: Secondary | ICD-10-CM

## 2018-03-03 ENCOUNTER — Other Ambulatory Visit: Payer: Self-pay | Admitting: Internal Medicine

## 2018-03-03 DIAGNOSIS — R928 Other abnormal and inconclusive findings on diagnostic imaging of breast: Secondary | ICD-10-CM

## 2018-03-03 DIAGNOSIS — Z1231 Encounter for screening mammogram for malignant neoplasm of breast: Secondary | ICD-10-CM

## 2018-03-07 ENCOUNTER — Other Ambulatory Visit: Payer: Self-pay | Admitting: Internal Medicine

## 2018-03-07 DIAGNOSIS — Z1231 Encounter for screening mammogram for malignant neoplasm of breast: Secondary | ICD-10-CM

## 2018-04-10 ENCOUNTER — Ambulatory Visit
Admission: RE | Admit: 2018-04-10 | Discharge: 2018-04-10 | Disposition: A | Discharge status: Home / Self Care | Payer: BLUE CROSS/BLUE SHIELD | Source: Ambulatory Visit | Attending: Internal Medicine | Admitting: Internal Medicine

## 2018-04-10 DIAGNOSIS — Z1231 Encounter for screening mammogram for malignant neoplasm of breast: Secondary | ICD-10-CM

## 2018-04-10 DIAGNOSIS — R928 Other abnormal and inconclusive findings on diagnostic imaging of breast: Secondary | ICD-10-CM | POA: Insufficient documentation

## 2018-04-11 ENCOUNTER — Other Ambulatory Visit: Payer: Self-pay | Admitting: Internal Medicine

## 2018-04-11 DIAGNOSIS — N6001 Solitary cyst of right breast: Secondary | ICD-10-CM

## 2018-04-11 DIAGNOSIS — N6310 Unspecified lump in the right breast, unspecified quadrant: Secondary | ICD-10-CM

## 2018-09-25 ENCOUNTER — Ambulatory Visit
Admission: RE | Admit: 2018-09-25 | Discharge: 2018-09-25 | Disposition: A | Discharge status: Home / Self Care | Payer: BLUE CROSS/BLUE SHIELD | Source: Ambulatory Visit | Attending: Internal Medicine | Admitting: Internal Medicine

## 2018-09-25 ENCOUNTER — Other Ambulatory Visit: Payer: Self-pay

## 2018-09-25 DIAGNOSIS — N6310 Unspecified lump in the right breast, unspecified quadrant: Secondary | ICD-10-CM | POA: Insufficient documentation

## 2018-09-25 DIAGNOSIS — N6001 Solitary cyst of right breast: Secondary | ICD-10-CM

## 2018-12-12 ENCOUNTER — Other Ambulatory Visit: Payer: Self-pay | Admitting: Specialist

## 2018-12-13 ENCOUNTER — Other Ambulatory Visit: Payer: Self-pay | Admitting: Specialist

## 2018-12-13 DIAGNOSIS — R197 Diarrhea, unspecified: Secondary | ICD-10-CM

## 2018-12-13 DIAGNOSIS — R634 Abnormal weight loss: Secondary | ICD-10-CM

## 2018-12-13 DIAGNOSIS — R0902 Hypoxemia: Secondary | ICD-10-CM

## 2018-12-20 ENCOUNTER — Other Ambulatory Visit: Payer: Self-pay

## 2018-12-20 ENCOUNTER — Ambulatory Visit
Admission: RE | Admit: 2018-12-20 | Discharge: 2018-12-20 | Disposition: A | Discharge status: Home / Self Care | Payer: BC Managed Care – PPO | Source: Ambulatory Visit | Attending: Specialist | Admitting: Specialist

## 2018-12-20 DIAGNOSIS — I700 Atherosclerosis of aorta: Secondary | ICD-10-CM | POA: Diagnosis not present

## 2018-12-20 DIAGNOSIS — D259 Leiomyoma of uterus, unspecified: Secondary | ICD-10-CM | POA: Diagnosis not present

## 2018-12-20 DIAGNOSIS — I2510 Atherosclerotic heart disease of native coronary artery without angina pectoris: Secondary | ICD-10-CM | POA: Diagnosis not present

## 2018-12-20 DIAGNOSIS — R918 Other nonspecific abnormal finding of lung field: Secondary | ICD-10-CM | POA: Diagnosis not present

## 2018-12-20 DIAGNOSIS — R197 Diarrhea, unspecified: Secondary | ICD-10-CM | POA: Insufficient documentation

## 2018-12-20 DIAGNOSIS — R634 Abnormal weight loss: Secondary | ICD-10-CM | POA: Diagnosis present

## 2018-12-20 DIAGNOSIS — R0902 Hypoxemia: Secondary | ICD-10-CM | POA: Diagnosis present

## 2018-12-20 HISTORY — DX: Essential (primary) hypertension: I10

## 2018-12-20 LAB — POCT I-STAT CREATININE: Creatinine, Ser: 0.9 mg/dL (ref 0.44–1.00)

## 2018-12-20 MED ORDER — IOHEXOL 300 MG/ML  SOLN
75.0000 mL | Freq: Once | INTRAMUSCULAR | Status: AC | PRN
  Administered 2018-12-20: 75 mL via INTRAVENOUS

## 2019-05-03 ENCOUNTER — Other Ambulatory Visit: Payer: BC Managed Care – PPO

## 2019-05-07 ENCOUNTER — Ambulatory Visit: Payer: BC Managed Care – PPO | Attending: Internal Medicine

## 2019-05-07 DIAGNOSIS — Z23 Encounter for immunization: Secondary | ICD-10-CM | POA: Insufficient documentation

## 2019-05-07 NOTE — Progress Notes (Signed)
   Covid-19 Vaccination Clinic  Name:  Monica Stewart    MRN: [id number] DOB: Aug 20, 1945  05/07/2019  Monica Stewart was observed post Covid-19 immunization for 15 minutes without incidence. She was provided with Vaccine Information Sheet and instruction to access the V-Safe system.   Monica Stewart was instructed to call 911 with any severe reactions post vaccine: Marland Kitchen Difficulty breathing  . Swelling of your face and throat  . A fast heartbeat  . A bad rash all over your body  . Dizziness and weakness    Immunizations Administered    Name Date Dose VIS Date Route   Moderna COVID-19 Vaccine 05/07/2019 11:36 AM 0.5 mL 02/27/2019 Intramuscular   Manufacturer: Moderna   Lot: [id number]   CidraPO:[id number]

## 2019-06-05 ENCOUNTER — Other Ambulatory Visit: Payer: Self-pay | Admitting: Internal Medicine

## 2019-06-05 DIAGNOSIS — N63 Unspecified lump in unspecified breast: Secondary | ICD-10-CM

## 2019-06-06 ENCOUNTER — Ambulatory Visit: Payer: BC Managed Care – PPO | Attending: Internal Medicine

## 2019-06-06 DIAGNOSIS — Z23 Encounter for immunization: Secondary | ICD-10-CM | POA: Insufficient documentation

## 2019-06-06 NOTE — Progress Notes (Signed)
   Covid-19 Vaccination Clinic  Name:  Monica Stewart    MRN: [id number] DOB: September 28, 1945  06/06/2019  Monica Stewart was observed post Covid-19 immunization for 15 minutes without incident. She was provided with Vaccine Information Sheet and instruction to access the V-Safe system.   Monica Stewart was instructed to call 911 with any severe reactions post vaccine: Marland Kitchen Difficulty breathing  . Swelling of face and throat  . A fast heartbeat  . A bad rash all over body  . Dizziness and weakness   Immunizations Administered    Name Date Dose VIS Date Route   Moderna COVID-19 Vaccine 06/06/2019 11:26 AM 0.5 mL 02/27/2019 Intramuscular   Manufacturer: Moderna   Lot: [id number]   H. Rivera ColonVO:[id number]

## 2019-06-18 ENCOUNTER — Other Ambulatory Visit: Payer: Self-pay | Admitting: Internal Medicine

## 2019-06-18 DIAGNOSIS — N63 Unspecified lump in unspecified breast: Secondary | ICD-10-CM

## 2019-09-26 ENCOUNTER — Ambulatory Visit
Admission: RE | Admit: 2019-09-26 | Discharge: 2019-09-26 | Disposition: A | Discharge status: Home / Self Care | Payer: BC Managed Care – PPO | Source: Ambulatory Visit | Attending: Internal Medicine | Admitting: Internal Medicine

## 2019-09-26 DIAGNOSIS — N63 Unspecified lump in unspecified breast: Secondary | ICD-10-CM

## 2019-09-26 DIAGNOSIS — N6312 Unspecified lump in the right breast, upper inner quadrant: Secondary | ICD-10-CM | POA: Insufficient documentation

## 2019-09-26 DIAGNOSIS — N6310 Unspecified lump in the right breast, unspecified quadrant: Secondary | ICD-10-CM | POA: Diagnosis present

## 2019-11-19 ENCOUNTER — Other Ambulatory Visit: Payer: Self-pay | Admitting: Specialist

## 2019-11-19 DIAGNOSIS — R911 Solitary pulmonary nodule: Secondary | ICD-10-CM

## 2020-01-01 ENCOUNTER — Telehealth: Payer: BC Managed Care – PPO | Admitting: Specialist

## 2020-01-02 ENCOUNTER — Other Ambulatory Visit: Payer: Self-pay | Admitting: Specialist

## 2020-01-02 DIAGNOSIS — R918 Other nonspecific abnormal finding of lung field: Secondary | ICD-10-CM

## 2020-01-03 ENCOUNTER — Ambulatory Visit: Payer: BC Managed Care – PPO

## 2020-01-22 ENCOUNTER — Ambulatory Visit
Admission: RE | Admit: 2020-01-22 | Discharge: 2020-01-22 | Disposition: A | Discharge status: Home / Self Care | Payer: BC Managed Care – PPO | Source: Ambulatory Visit | Attending: Specialist | Admitting: Specialist

## 2020-01-22 ENCOUNTER — Other Ambulatory Visit: Payer: Self-pay | Admitting: Specialist

## 2020-01-22 DIAGNOSIS — R918 Other nonspecific abnormal finding of lung field: Secondary | ICD-10-CM

## 2020-02-23 ENCOUNTER — Encounter: Payer: Self-pay | Admitting: Emergency Medicine

## 2020-02-23 ENCOUNTER — Inpatient Hospital Stay
Admission: EM | Admit: 2020-02-23 | Discharge: 2020-02-25 | DRG: 190 | Disposition: A | Discharge status: Home / Self Care | Payer: BC Managed Care – PPO | Source: Ambulatory Visit | Attending: Hospitalist | Admitting: Hospitalist

## 2020-02-23 ENCOUNTER — Emergency Department: Payer: BC Managed Care – PPO

## 2020-02-23 ENCOUNTER — Other Ambulatory Visit: Payer: Self-pay

## 2020-02-23 DIAGNOSIS — Z803 Family history of malignant neoplasm of breast: Secondary | ICD-10-CM | POA: Diagnosis not present

## 2020-02-23 DIAGNOSIS — J441 Chronic obstructive pulmonary disease with (acute) exacerbation: Secondary | ICD-10-CM | POA: Diagnosis present

## 2020-02-23 DIAGNOSIS — Z87891 Personal history of nicotine dependence: Secondary | ICD-10-CM | POA: Diagnosis not present

## 2020-02-23 DIAGNOSIS — Z79899 Other long term (current) drug therapy: Secondary | ICD-10-CM | POA: Diagnosis not present

## 2020-02-23 DIAGNOSIS — U071 COVID-19: Secondary | ICD-10-CM | POA: Diagnosis present

## 2020-02-23 DIAGNOSIS — Z7951 Long term (current) use of inhaled steroids: Secondary | ICD-10-CM | POA: Diagnosis not present

## 2020-02-23 DIAGNOSIS — K219 Gastro-esophageal reflux disease without esophagitis: Secondary | ICD-10-CM | POA: Diagnosis present

## 2020-02-23 DIAGNOSIS — E785 Hyperlipidemia, unspecified: Secondary | ICD-10-CM | POA: Diagnosis present

## 2020-02-23 DIAGNOSIS — Z7982 Long term (current) use of aspirin: Secondary | ICD-10-CM | POA: Diagnosis not present

## 2020-02-23 DIAGNOSIS — I10 Essential (primary) hypertension: Secondary | ICD-10-CM | POA: Diagnosis present

## 2020-02-23 DIAGNOSIS — J9621 Acute and chronic respiratory failure with hypoxia: Secondary | ICD-10-CM | POA: Diagnosis present

## 2020-02-23 DIAGNOSIS — Z9981 Dependence on supplemental oxygen: Secondary | ICD-10-CM | POA: Diagnosis not present

## 2020-02-23 DIAGNOSIS — R0602 Shortness of breath: Secondary | ICD-10-CM | POA: Diagnosis present

## 2020-02-23 DIAGNOSIS — E876 Hypokalemia: Secondary | ICD-10-CM | POA: Diagnosis present

## 2020-02-23 LAB — CBC
HCT: 42.8 % (ref 36.0–46.0)
Hemoglobin: 14.6 g/dL (ref 12.0–15.0)
MCH: 30 pg (ref 26.0–34.0)
MCHC: 34.1 g/dL (ref 30.0–36.0)
MCV: 87.9 fL (ref 80.0–100.0)
Platelets: 194 10*3/uL (ref 150–400)
RBC: 4.87 MIL/uL (ref 3.87–5.11)
RDW: 14.4 % (ref 11.5–15.5)
WBC: 10.7 10*3/uL — ABNORMAL HIGH (ref 4.0–10.5)
nRBC: 0 % (ref 0.0–0.2)

## 2020-02-23 LAB — BASIC METABOLIC PANEL
Anion gap: 14 (ref 5–15)
BUN: 17 mg/dL (ref 8–23)
CO2: 28 mmol/L (ref 22–32)
Calcium: 9 mg/dL (ref 8.9–10.3)
Chloride: 93 mmol/L — ABNORMAL LOW (ref 98–111)
Creatinine, Ser: 0.73 mg/dL (ref 0.44–1.00)
GFR, Estimated: >60 mL/min (ref >60)
Glucose, Bld: 121 mg/dL — ABNORMAL HIGH (ref 70–99)
Potassium: 3.2 mmol/L — ABNORMAL LOW (ref 3.5–5.1)
Sodium: 135 mmol/L (ref 135–145)

## 2020-02-23 LAB — FERRITIN: Ferritin: 767 ng/mL — ABNORMAL HIGH (ref 11–307)

## 2020-02-23 LAB — FIBRINOGEN: Fibrinogen: 639 mg/dL — ABNORMAL HIGH (ref 210–475)

## 2020-02-23 LAB — RESP PANEL BY RT-PCR (FLU A&B, COVID) ARPGX2
Influenza A by PCR: NEGATIVE
Influenza B by PCR: NEGATIVE
SARS Coronavirus 2 by RT PCR: POSITIVE — AB

## 2020-02-23 LAB — MAGNESIUM: Magnesium: 1.4 mg/dL — ABNORMAL LOW (ref 1.7–2.4)

## 2020-02-23 LAB — LACTIC ACID, PLASMA: Lactic Acid, Venous: 1 mmol/L (ref 0.5–1.9)

## 2020-02-23 LAB — TRIGLYCERIDES: Triglycerides: 46 mg/dL (ref <150)

## 2020-02-23 LAB — FIBRIN DERIVATIVES D-DIMER (ARMC ONLY): Fibrin derivatives D-dimer (ARMC): 661.3 ng/mL (FEU) — ABNORMAL HIGH (ref 0.00–499.00)

## 2020-02-23 LAB — PROCALCITONIN: Procalcitonin: 0.19 ng/mL

## 2020-02-23 LAB — LACTATE DEHYDROGENASE: LDH: 220 U/L — ABNORMAL HIGH (ref 98–192)

## 2020-02-23 MED ORDER — FOLIC ACID 1 MG PO TABS
1.0000 mg | ORAL_TABLET | Freq: Every day | ORAL | Status: DC
  Administered 2020-02-24 – 2020-02-25 (×2): 1 mg via ORAL
  Filled 2020-02-23 (×2): qty 1

## 2020-02-23 MED ORDER — PRAVASTATIN SODIUM 20 MG PO TABS
20.0000 mg | ORAL_TABLET | Freq: Every day | ORAL | Status: DC
  Administered 2020-02-23 – 2020-02-24 (×2): 20 mg via ORAL
  Filled 2020-02-23 (×2): qty 1

## 2020-02-23 MED ORDER — ONDANSETRON HCL 4 MG PO TABS: 4.0000 mg | ORAL_TABLET | Freq: Four times a day (QID) | ORAL | Status: DC | PRN

## 2020-02-23 MED ORDER — VITAMIN B-12 500 MCG PO TABS: 500.0000 ug | ORAL_TABLET | Freq: Every day | ORAL | Status: DC

## 2020-02-23 MED ORDER — FERROUS SULFATE 325 (65 FE) MG PO TABS: 325.0000 mg | ORAL_TABLET | ORAL | Status: DC

## 2020-02-23 MED ORDER — GUAIFENESIN-DM 100-10 MG/5ML PO SYRP: 10.0000 mL | ORAL_SOLUTION | ORAL | Status: DC | PRN

## 2020-02-23 MED ORDER — ACETAMINOPHEN 325 MG PO TABS: 650.0000 mg | ORAL_TABLET | Freq: Four times a day (QID) | ORAL | Status: DC | PRN

## 2020-02-23 MED ORDER — FLUTICASONE FUROATE-VILANTEROL 100-25 MCG/INH IN AEPB
1.0000 | INHALATION_SPRAY | Freq: Every day | RESPIRATORY_TRACT | Status: DC
  Administered 2020-02-24 – 2020-02-25 (×2): 1 via RESPIRATORY_TRACT
  Filled 2020-02-23: qty 28

## 2020-02-23 MED ORDER — HYDROCHLOROTHIAZIDE 25 MG PO TABS
25.0000 mg | ORAL_TABLET | Freq: Every day | ORAL | Status: DC
  Administered 2020-02-23 – 2020-02-25 (×3): 25 mg via ORAL
  Filled 2020-02-23 (×3): qty 1

## 2020-02-23 MED ORDER — UMECLIDINIUM BROMIDE 62.5 MCG/INH IN AEPB
1.0000 | INHALATION_SPRAY | Freq: Every day | RESPIRATORY_TRACT | Status: DC
  Administered 2020-02-24 – 2020-02-25 (×2): 1 via RESPIRATORY_TRACT
  Filled 2020-02-23: qty 7

## 2020-02-23 MED ORDER — ONDANSETRON HCL 4 MG/2ML IJ SOLN: 4.0000 mg | Freq: Four times a day (QID) | INTRAMUSCULAR | Status: DC | PRN

## 2020-02-23 MED ORDER — LOSARTAN POTASSIUM 50 MG PO TABS
100.0000 mg | ORAL_TABLET | Freq: Every day | ORAL | Status: DC
  Administered 2020-02-23 – 2020-02-25 (×3): 100 mg via ORAL
  Filled 2020-02-23 (×3): qty 2

## 2020-02-23 MED ORDER — PNEUMOCOCCAL VAC POLYVALENT 25 MCG/0.5ML IJ INJ: 0.5000 mL | INJECTION | INTRAMUSCULAR | Status: DC

## 2020-02-23 MED ORDER — LOSARTAN POTASSIUM-HCTZ 100-25 MG PO TABS: 1.0000 | ORAL_TABLET | Freq: Every day | ORAL | Status: DC

## 2020-02-23 MED ORDER — DEXAMETHASONE SODIUM PHOSPHATE 4 MG/ML IJ SOLN: 4.0000 mg | INTRAMUSCULAR | Status: DC

## 2020-02-23 MED ORDER — DEXAMETHASONE SODIUM PHOSPHATE 10 MG/ML IJ SOLN
8.0000 mg | Freq: Once | INTRAMUSCULAR | Status: AC
  Administered 2020-02-23: 8 mg via INTRAVENOUS
  Filled 2020-02-23: qty 1

## 2020-02-23 MED ORDER — ADULT MULTIVITAMIN W/MINERALS CH
1.0000 | ORAL_TABLET | Freq: Every day | ORAL | Status: DC
  Administered 2020-02-24 – 2020-02-25 (×2): 1 via ORAL
  Filled 2020-02-23 (×2): qty 1

## 2020-02-23 MED ORDER — ENOXAPARIN SODIUM 40 MG/0.4ML ~~LOC~~ SOLN
40.0000 mg | SUBCUTANEOUS | Status: DC
  Administered 2020-02-23 – 2020-02-24 (×2): 40 mg via SUBCUTANEOUS
  Filled 2020-02-23 (×2): qty 0.4

## 2020-02-23 MED ORDER — ASPIRIN EC 81 MG PO TBEC
81.0000 mg | DELAYED_RELEASE_TABLET | Freq: Every day | ORAL | Status: DC
  Administered 2020-02-24 – 2020-02-25 (×2): 81 mg via ORAL
  Filled 2020-02-23 (×2): qty 1

## 2020-02-23 MED ORDER — IPRATROPIUM-ALBUTEROL 20-100 MCG/ACT IN AERS
1.0000 | INHALATION_SPRAY | Freq: Four times a day (QID) | RESPIRATORY_TRACT | Status: DC
  Administered 2020-02-23 – 2020-02-25 (×9): 1 via RESPIRATORY_TRACT
  Filled 2020-02-23: qty 4

## 2020-02-23 MED ORDER — CALCIUM CITRATE-VITAMIN D 500-500 MG-UNIT PO CHEW
1.0000 | CHEWABLE_TABLET | Freq: Two times a day (BID) | ORAL | Status: DC
  Administered 2020-02-24 – 2020-02-25 (×3): 1 via ORAL
  Filled 2020-02-23 (×5): qty 1

## 2020-02-23 MED ORDER — MOMETASONE FURO-FORMOTEROL FUM 200-5 MCG/ACT IN AERO: 2.0000 | INHALATION_SPRAY | Freq: Two times a day (BID) | RESPIRATORY_TRACT | Status: DC

## 2020-02-23 MED ORDER — HYDROCOD POLST-CPM POLST ER 10-8 MG/5ML PO SUER
5.0000 mL | Freq: Two times a day (BID) | ORAL | Status: DC | PRN
  Administered 2020-02-24 – 2020-02-25 (×2): 5 mL via ORAL
  Filled 2020-02-23 (×2): qty 5

## 2020-02-23 MED ORDER — LORATADINE 10 MG PO TABS
10.0000 mg | ORAL_TABLET | Freq: Every day | ORAL | Status: DC
  Administered 2020-02-24 – 2020-02-25 (×2): 10 mg via ORAL
  Filled 2020-02-23 (×2): qty 1

## 2020-02-23 MED ORDER — DOCUSATE SODIUM 100 MG PO CAPS
100.0000 mg | ORAL_CAPSULE | Freq: Two times a day (BID) | ORAL | Status: DC
  Administered 2020-02-23 – 2020-02-25 (×4): 100 mg via ORAL
  Filled 2020-02-23 (×4): qty 1

## 2020-02-23 MED ORDER — ALBUTEROL SULFATE HFA 108 (90 BASE) MCG/ACT IN AERS
2.0000 | INHALATION_SPRAY | RESPIRATORY_TRACT | Status: DC | PRN
  Administered 2020-02-23: 2 via RESPIRATORY_TRACT
  Filled 2020-02-23: qty 6.7

## 2020-02-23 MED ORDER — CALCIUM CITRATE-VITAMIN D 315-200 MG-UNIT PO TABS: 1.0000 | ORAL_TABLET | Freq: Two times a day (BID) | ORAL | Status: DC

## 2020-02-23 MED ORDER — POTASSIUM CHLORIDE IN NACL 40-0.9 MEQ/L-% IV SOLN
INTRAVENOUS | Status: AC
  Filled 2020-02-23 (×2): qty 1000

## 2020-02-23 NOTE — ED Provider Notes (Signed)
[facility] Emergency Department Provider Note   ____________________________________________    I have reviewed the triage vital signs and the nursing notes.   HISTORY  Chief Complaint Shortness of Breath     HPI Monica Stewart is a 74 y.o. female with a history of COPD who presents with shortness of breath.  Patient reports she was feeling fatigued, achy and just not feeling well this morning, went to urgent care where she tested positive for COVID-19.  They found her to be markedly hypoxic.  She does wear nasal cannula oxygen 2 L intermittently at home.  Denies chest pain.  No diaphoresis.  Found to be hypoxic at urgent care significantly in the 70s.  Past Medical History:  Diagnosis Date  . Anginal pain ([place])   . Asthma   . GERD (gastroesophageal reflux disease)   . H/O wheezing   . History of orthopnea   . Hypertension   . Shortness of breath dyspnea     There are no problems to display for this patient.   Past Surgical History:  Procedure Laterality Date  . 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 CARDIAC CATHETERIZATION    . CATARACT EXTRACTION W/PHACO Right 01/23/2015   Procedure: CATARACT EXTRACTION PHACO AND INTRAOCULAR LENS PLACEMENT (IOC);  Surgeon: Lyla Glassing, MD;  Location: [facility] ORS;  Service: Ophthalmology;  Laterality: Right;  [place]            1.09 AP             17.7 CDE         12.16 casette lot # [id number]  . EYE SURGERY    . TUBAL LIGATION      Prior to Admission medications   Medication Sig Start Date End Date Taking? Authorizing Provider  albuterol (PROVENTIL) (5 MG/ML) 0.5% nebulizer solution Take 2.5 mg by nebulization every 6 (six) hours as needed for wheezing or shortness of breath.    [provider]  aspirin EC 81 MG tablet Take 81 mg by mouth daily.    [provider]  Black Cohosh 200 MG CAPS Take by mouth.    [provider]    budesonide-formoterol (SYMBICORT) 160-4.5 MCG/ACT inhaler Inhale 2 puffs into the lungs 2 (two) times daily.    [provider]  calcium citrate-vitamin D (CITRACAL+D) 315-200 MG-UNIT tablet Take 1 tablet by mouth 2 (two) times daily.    [provider]  ferrous sulfate 325 (65 FE) MG tablet Take 325 mg by mouth every other day.    [provider]  fexofenadine (ALLEGRA) 60 MG tablet Take 60 mg by mouth every other day.    [provider]  Iron-Vitamins (GERITOL PO) Take by mouth.    [provider]  loratadine (CLARITIN) 10 MG tablet Take 10 mg by mouth daily.    [provider]  losartan-hydrochlorothiazide (HYZAAR) 100-25 MG tablet Take 1 tablet by mouth daily.    [provider]  lovastatin (MEVACOR) 40 MG tablet Take 40 mg by mouth at bedtime.    [provider]  vitamin B-12 (CYANOCOBALAMIN) 500 MCG tablet Take 500 mcg by mouth daily.    [provider]  vitamin E 1000 UNIT capsule Take 1,000 Units by mouth daily.    [provider]     Allergies Patient has no known allergies.  Family History  Problem Relation Age of Onset  . Breast cancer Sister  75  . Breast cancer Paternal Aunt     Social History Social History   Tobacco Use  . Smoking status: Former Smoker    Quit date: 01/22/1989    Years since quitting: 31.1  Substance Use Topics  . Alcohol use: No  . Drug use: No    Review of Systems  Constitutional: Chills Eyes: No visual changes.  ENT: No sore throat. Cardiovascular: Denies chest pain. Respiratory: Shortness of breath Gastrointestinal: No abdominal pain.  No nausea, no vomiting.   Genitourinary: Negative for dysuria. Musculoskeletal: Body aches Skin: Negative for rash. Neurological: Negative for headaches or weakness   ____________________________________________   PHYSICAL EXAM:  VITAL SIGNS: ED Triage Vitals  Enc Vitals Group     BP 02/23/20 1139 (!)  154/82     Pulse Rate 02/23/20 1139 (!) 117     Resp 02/23/20 1139 (!) 21     Temp 02/23/20 1139 99 F (37.2 C)     Temp Source 02/23/20 1139 Oral     SpO2 02/23/20 1139 97 %     Weight 02/23/20 1140 50.3 kg (111 lb)     Height 02/23/20 1140 1.626 m (5\' 4" )     Head Circumference --      Peak Flow --      Pain Score 02/23/20 1140 0     Pain Loc --      Pain Edu? --      Excl. in [place]? --     Constitutional: Alert and oriented.  Eyes: Conjunctivae are normal.  Head: Atraumatic. Nose: No congestion/rhinnorhea. Mouth/Throat: Mucous membranes are moist.   Neck:  Painless ROM Cardiovascular: Tachycardia, regular rhythm. Grossly normal heart sounds.  Good peripheral circulation. Respiratory: Tachypnea.  No retractions.  Scattered wheezes Gastrointestinal: Soft and nontender. No distention.  No CVA tenderness. Genitourinary: deferred Musculoskeletal: No lower extremity tenderness nor edema.  Warm and well perfused Neurologic:  Normal speech and language. No gross focal neurologic deficits are appreciated.  Skin:  Skin is warm, dry and intact. No rash noted. Psychiatric: Mood and affect are normal. Speech and behavior are normal.  ____________________________________________   LABS (all labs ordered are listed, but only abnormal results are displayed)  Labs Reviewed  BASIC METABOLIC PANEL - Abnormal; Notable for the following components:      Result Value   Potassium 3.2 (*)    Chloride 93 (*)    Glucose, Bld 121 (*)    All other components within normal limits  CBC - Abnormal; Notable for the following components:   WBC 10.7 (*)    All other components within normal limits  RESP PANEL BY RT-PCR (FLU A&B, COVID) [id number]  CULTURE, BLOOD (ROUTINE X 2)  CULTURE, BLOOD (ROUTINE X 2)  LACTIC ACID, PLASMA  LACTIC ACID, PLASMA  FIBRIN DERIVATIVES D-DIMER ([facility] ONLY)  PROCALCITONIN  LACTATE DEHYDROGENASE  FERRITIN  TRIGLYCERIDES  FIBRINOGEN  C-REACTIVE PROTEIN    ____________________________________________  EKG  ED ECG REPORT I, Lavonia Drafts, the attending physician, personally viewed and interpreted this ECG.  Date: 02/23/2020  Rhythm: Sinus tachycardia QRS Axis: normal Intervals: normal ST/T Wave abnormalities: normal Narrative Interpretation: no evidence of acute ischemia  ____________________________________________  RADIOLOGY  Chest x-ray reviewed by me, no evidence of infiltrate ____________________________________________   PROCEDURES  Procedure(s) performed: No  Procedures   Critical Care performed: yes  CRITICAL CARE Performed by: Lavonia Drafts   Total critical care time: 30 minutes  Critical care time was exclusive of separately billable procedures and treating  other patients.  Critical care was necessary to treat or prevent imminent or life-threatening deterioration.  Critical care was time spent personally by me on the following activities: development of treatment plan with patient and/or surrogate as well as nursing, discussions with consultants, evaluation of patient's response to treatment, examination of patient, obtaining history from patient or surrogate, ordering and performing treatments and interventions, ordering and review of laboratory studies, ordering and review of radiographic studies, pulse oximetry and re-evaluation of patient's condition.  ____________________________________________   INITIAL IMPRESSION / ASSESSMENT AND PLAN / ED COURSE  Pertinent labs & imaging results that were available during my care of the patient were reviewed by me and considered in my medical decision making (see chart for details).  Patient presents with shortness of breath in the setting of COPD and positive Covid test.  Differential includes Covid pneumonia, COPD exacerbation/bronchospasm.  Patient is requiring 4 L nasal cannula to keep her sats above 90%.  She does have some wheezing we will treat with MDI  given her Covid positive status, will send confirmatory PCR  Chest x-ray is overall reassuring, suspect Covid exacerbating COPD,   IV Decadron given, discussed with hospitalist service for admission        ____________________________________________   FINAL CLINICAL IMPRESSION(S) / ED DIAGNOSES  Final diagnoses:  COPD exacerbation ([place])  COVID-19  Acute hypoxemic respiratory failure due to COVID-19 [facility])        Note:  This document was prepared using Dragon voice recognition software and may include unintentional dictation errors.   Lavonia Drafts, MD 02/23/20 1321

## 2020-02-23 NOTE — ED Triage Notes (Signed)
Pt sent over via [facility] due to low oxygen sats and being COVID +. Pt denies pain and states it does not even feel hard to breathe. Pt 79% on RA, placed on 4L by 1st RN and now 96%

## 2020-02-23 NOTE — ED Triage Notes (Signed)
First RN Note: This RN received report from [facility] provider. Pt to ED, tested positive for Covid today, sats in the 70's. Pt refused EMS transport to ED from [facility]. Pt arrives A&O x4, RA sats 79% on arrival to ED.

## 2020-02-23 NOTE — Progress Notes (Signed)
Patient alert and oriented, Yellow MEWS due to HR, no complaints of pain, will continue to monitor.

## 2020-02-23 NOTE — H&P (Addendum)
Chief Complaint: Patient was referred to emergency room from an urgent care center on account of positive Covid. HPI:  Monica Stewart is a 74 y.o. female with history of COPD on 2 L/min of oxygen at night.  Other comorbidities include hypertension and GERD.  Patient is up-to-date with her Covid vaccination and had been working at [employer] on Thanksgiving night.  She woke up yesterday with subjective symptoms of fever and chills.  This morning, she continued to feel fatigued and unwell hence presented to an urgent care center for further evaluation and management.  She apparently tested positive for Covid at the facility hence was referred to [facility] for further evaluation, work-up and further management.  She admits to worsening shortness of breath from baseline which is consistent with her COPD exacerbations.  She admits to occasional dry cough with no sputum.  Denies any nausea or vomiting.  Denies any joint pains or swelling.  On presentation, Repeat PCR COVID was positive. Chest x-ray done showed no obvious cardiopulmonary changes suggesting pneumonitis at this time.  She was noted to be saturating at 96% on 3 L/min of oxygen.  Labs were reviewed and essentially unremarkable except for potassium of 3.2.  Inflammatory markers including fibrinogen and FDP were noted to be elevated.  Past Medical History:  Diagnosis Date  . Anginal pain ([place])   . Asthma   . GERD (gastroesophageal reflux disease)   . H/O wheezing   . History of orthopnea   . Hypertension   . Shortness of breath dyspnea     Past Surgical History:  Procedure Laterality Date  . 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 CARDIAC CATHETERIZATION    . CATARACT EXTRACTION W/PHACO Right 01/23/2015   Procedure: CATARACT EXTRACTION PHACO AND INTRAOCULAR LENS PLACEMENT (IOC);  Surgeon: Lyla Glassing, MD;  Location: [facility] ORS;  Service: Ophthalmology;   Laterality: Right;  [place]            1.09 AP             17.7 CDE         12.16 casette lot # [id number]  . EYE SURGERY    . TUBAL LIGATION      Family History  Problem Relation Age of Onset  . Breast cancer Sister 87  . Breast cancer Paternal Aunt    Social History:  reports that she quit smoking about 31 years ago. She does not have any smokeless tobacco history on file. She reports that she does not drink alcohol and does not use drugs.  Allergies: No Known Allergies  (Not in a hospital admission)   Results for orders placed or performed during the hospital encounter of 02/23/20 (from the past 48 hour(s))  Basic metabolic panel     Status: Abnormal   Collection Time: 02/23/20 11:45 AM  Result Value Ref Range   Sodium 135 135 - 145 mmol/L   Potassium 3.2 (L) 3.5 - 5.1 mmol/L   Chloride 93 (L) 98 - 111 mmol/L   CO2 28 22 - 32 mmol/L   Glucose, Bld 121 (H) 70 - 99 mg/dL    Comment: Glucose reference range applies only to samples taken after fasting for at least 8 hours.   BUN 17 8 - 23 mg/dL   Creatinine, Ser 0.73 0.44 - 1.00 mg/dL   Calcium 9.0 8.9 - 10.3 mg/dL   GFR, Estimated >60 >60 mL/min    Comment: (NOTE)  Calculated using the CKD-EPI Creatinine Equation (2021)    Anion gap 14 5 - 15    Comment: Performed at [facility], [address] [place], [place] [address]  CBC     Status: Abnormal   Collection Time: 02/23/20 11:45 AM  Result Value Ref Range   WBC 10.7 (H) 4.0 - 10.5 K/uL   RBC 4.87 3.87 - 5.11 MIL/uL   Hemoglobin 14.6 12.0 - 15.0 g/dL   HCT 42.8 36 - 46 %   MCV 87.9 80.0 - 100.0 fL   MCH 30.0 26.0 - 34.0 pg   MCHC 34.1 30.0 - 36.0 g/dL   RDW 14.4 11.5 - 15.5 %   Platelets 194 150 - 400 K/uL   nRBC 0.0 0.0 - 0.2 %    Comment: Performed at [facility], [address], [place], [place] [address]  Resp Panel by RT-PCR (Flu A&B, Covid) Nasopharyngeal Swab     Status: Abnormal   Collection Time: 02/23/20 12:53 PM   Specimen:  Nasopharyngeal Swab; Nasopharyngeal(NP) swabs in vial transport medium  Result Value Ref Range   SARS Coronavirus 2 by RT PCR POSITIVE (A) NEGATIVE    Comment: RESULT CALLED TO, READ BACK BY AND VERIFIED WITH: JENNIFER WHITLEY AT 8546 02/23/20.PMF (NOTE) SARS-CoV-2 target nucleic acids are DETECTED.  The SARS-CoV-2 RNA is generally detectable in upper respiratory specimens during the acute phase of infection. Positive results are indicative of the presence of the identified virus, but do not rule out bacterial infection or co-infection with other pathogens not detected by the test. Clinical correlation with patient history and other diagnostic information is necessary to determine patient infection status. The expected result is Negative.  Fact Sheet for Patients: EntrepreneurPulse.com.au  Fact Sheet for Healthcare Providers: IncredibleEmployment.be  This test is not yet approved or cleared by the [place] FDA and  has been authorized for detection and/or diagnosis of SARS-CoV-2 by FDA under an Emergency Use Authorization (EUA).  This EUA will remain in effect (meaning this test ca n be used) for the duration of  the COVID-19 declaration under Section 564(b)(1) of the Act, 21 U.S.C. section 360bbb-3(b)(1), unless the authorization is terminated or revoked sooner.     Influenza A by PCR NEGATIVE NEGATIVE   Influenza B by PCR NEGATIVE NEGATIVE    Comment: (NOTE) The Xpert Xpress SARS-CoV-2/FLU/RSV plus assay is intended as an aid in the diagnosis of influenza from Nasopharyngeal swab specimens and should not be used as a sole basis for treatment. Nasal washings and aspirates are unacceptable for Xpert Xpress SARS-CoV-2/FLU/RSV testing.  Fact Sheet for Patients: EntrepreneurPulse.com.au  Fact Sheet for Healthcare Providers: IncredibleEmployment.be  This test is not yet approved or cleared by the  [place] FDA and has been authorized for detection and/or diagnosis of SARS-CoV-2 by FDA under an Emergency Use Authorization (EUA). This EUA will remain in effect (meaning this test can be used) for the duration of the COVID-19 declaration under Section 564(b)(1) of the Act, 21 U.S.C. section 360bbb-3(b)(1), unless the authorization is terminated or revoked.  Performed at [facility], [address] [place], [place] [address]   Lactic acid, plasma     Status: None   Collection Time: 02/23/20 12:53 PM  Result Value Ref Range   Lactic Acid, Venous 1.0 0.5 - 1.9 mmol/L    Comment: Performed at [facility], [address] [place], [place] [address]  Fibrin derivatives D-Dimer     Status: Abnormal   Collection Time:  02/23/20 12:53 PM  Result Value Ref Range   Fibrin derivatives D-dimer ([facility]) 661.30 (H) 0.00 - 499.00 ng/mL (FEU)    Comment: (NOTE) <> Exclusion of Venous Thromboembolism (VTE) - OUTPATIENT ONLY   (Emergency Department or [place])    0-499 ng/ml (FEU): With a low to intermediate pretest probability                      for VTE this test result excludes the diagnosis                      of VTE.   >499 ng/ml (FEU) : VTE not excluded; additional work up for VTE is                      required.  <> Testing on Inpatients and Evaluation of Disseminated Intravascular   Coagulation (DIC) Reference Range:   0-499 ng/ml (FEU) Performed at [facility], [address] [place], [place] [address]   Lactate dehydrogenase     Status: Abnormal   Collection Time: 02/23/20 12:53 PM  Result Value Ref Range   LDH 220 (H) 98 - 192 U/L    Comment: Performed at [facility], [address] [place], [place] [address]  Ferritin     Status: Abnormal   Collection Time: 02/23/20 12:53 PM  Result Value Ref Range   Ferritin 767 (H) 11 - 307 ng/mL    Comment: Performed at [facility], [address] [place], [place] [address]   Triglycerides     Status: None   Collection Time: 02/23/20 12:53 PM  Result Value Ref Range   Triglycerides 46 <150 mg/dL    Comment: Performed at [facility], [address] [place], [place] [address]  Fibrinogen     Status: Abnormal   Collection Time: 02/23/20 12:53 PM  Result Value Ref Range   Fibrinogen 639 (H) 210 - 475 mg/dL    Comment: Performed at [facility], [address] [place],  [address]   DG Chest 2 View  Result Date: 02/23/2020 CLINICAL DATA:  Shortness of breath, COVID-19 positive EXAM: CHEST - 2 VIEW COMPARISON:  04/13/2006, 01/22/2020 FINDINGS: The heart size and mediastinal contours are within normal limits. Atherosclerotic calcification of the aortic knob. Hyperinflated lungs with flattening of the diaphragms. No focal airspace consolidation, pleural effusion, or pneumothorax. The visualized skeletal structures are unremarkable. IMPRESSION: No acute cardiopulmonary findings. COPD. Electronically Signed   By: Davina Poke D.O.   On: 02/23/2020 12:30    Review of Systems  Constitutional: Positive for activity change, chills, fatigue and fever.  HENT: Negative.   Eyes: Negative.  Negative for pain, discharge and itching.  Respiratory: Positive for cough and shortness of breath.   Gastrointestinal: Negative.   Endocrine: Negative.   Genitourinary: Negative.   Skin: Negative.   Allergic/Immunologic: Negative.   Neurological: Negative.     Blood pressure 123/79, pulse (!) 114, temperature 99 F (37.2 C), temperature source Oral, resp. rate (!) 22, height 5\' 4"  (1.626 m), weight 50.3 kg, SpO2 99 %. Physical Exam Vitals reviewed.  Constitutional:      Appearance: She is well-developed.  HENT:     Head: Normocephalic and atraumatic.  Eyes:     Extraocular Movements: Extraocular movements intact.     Pupils: Pupils are equal, round, and reactive to light.  Cardiovascular:     Rate and Rhythm: Normal rate and regular rhythm.  Pulmonary:     Effort: Pulmonary effort is normal.     Breath sounds: Examination of the right-middle field reveals decreased breath sounds. Examination of the left-middle field reveals decreased breath sounds. Examination of the right-lower field reveals decreased breath sounds. Examination of the left-lower field reveals decreased breath sounds. Decreased breath sounds present.  Chest:     Chest wall: No mass, deformity or crepitus.  Musculoskeletal:     Cervical back: Normal range of motion and neck supple.  Skin:    General: Skin is warm and dry.  Neurological:     General: No focal deficit present.     Mental Status: She is alert and oriented to person, place, and time.      Assessment/Plan Patient is a 74 year old who presents on account of COPD exacerbation.   #1.  Acute dyspnea secondary to acute COPD exacerbation.  Likely triggered by  COVID-19 infection.  Chest x-ray reviewed showed no obvious findings suggesting interstitial pneumonitis.  Patient is currently on 3 L/min of oxygen.  We will continue with same.  Patient was treated with Decadron in the ED and bronchodilators with albuterol/ipratropium.  Will repeat COVID-19 PCR.  In the interim, patient will be kept in isolation.  #2.  COVID-19 positive: Present on admission with signs of inflammatory response including fever, tachycardia, tachypnea suggesting sepsis.  Lactic acid however remains at 1.0.  Patient will be volume repleted.  No obvious chest x-ray findings. No indication for antivirals at this time.Dexamathasone was offered. Consider baricitinib or tocilizumab if symptoms worsen.  Patient is up-to-date with her COVID-19 vaccination (05/2019)  #3. Hypokalemia: This will be repleted as per protocol.  We will schedule for magnesium levels and replete as appropriate.  #4.  Chronic essential hypertension: Blood pressure remained stable on current regimen.  Low-salt diet advised.  Optimize with IV hydralazine as  needed.  #5.  History of GERD: Monitor whilst on glucocorticoids. Artist Beach, MD 02/23/2020, 2:12 PM

## 2020-02-24 DIAGNOSIS — J441 Chronic obstructive pulmonary disease with (acute) exacerbation: Secondary | ICD-10-CM | POA: Diagnosis not present

## 2020-02-24 DIAGNOSIS — U071 COVID-19: Secondary | ICD-10-CM | POA: Diagnosis not present

## 2020-02-24 LAB — C-REACTIVE PROTEIN: CRP: 11.5 mg/dL — ABNORMAL HIGH (ref <1.0)

## 2020-02-24 LAB — COMPREHENSIVE METABOLIC PANEL
ALT: 30 U/L (ref 0–44)
AST: 39 U/L (ref 15–41)
Albumin: 3.6 g/dL (ref 3.5–5.0)
Alkaline Phosphatase: 56 U/L (ref 38–126)
Anion gap: 13 (ref 5–15)
BUN: 16 mg/dL (ref 8–23)
CO2: 32 mmol/L (ref 22–32)
Calcium: 8.9 mg/dL (ref 8.9–10.3)
Chloride: 96 mmol/L — ABNORMAL LOW (ref 98–111)
Creatinine, Ser: 0.59 mg/dL (ref 0.44–1.00)
GFR, Estimated: >60 mL/min (ref >60)
Glucose, Bld: 94 mg/dL (ref 70–99)
Potassium: 3.7 mmol/L (ref 3.5–5.1)
Sodium: 141 mmol/L (ref 135–145)
Total Bilirubin: 0.4 mg/dL (ref 0.3–1.2)
Total Protein: 7.6 g/dL (ref 6.5–8.1)

## 2020-02-24 LAB — CBC WITH DIFFERENTIAL/PLATELET
Abs Immature Granulocytes: 0.02 10*3/uL (ref 0.00–0.07)
Basophils Absolute: 0 10*3/uL (ref 0.0–0.1)
Basophils Relative: 0 %
Eosinophils Absolute: 0 10*3/uL (ref 0.0–0.5)
Eosinophils Relative: 0 %
HCT: 45.8 % (ref 36.0–46.0)
Hemoglobin: 14.9 g/dL (ref 12.0–15.0)
Immature Granulocytes: 0 %
Lymphocytes Relative: 9 %
Lymphs Abs: 0.7 10*3/uL (ref 0.7–4.0)
MCH: 29.7 pg (ref 26.0–34.0)
MCHC: 32.5 g/dL (ref 30.0–36.0)
MCV: 91.2 fL (ref 80.0–100.0)
Monocytes Absolute: 0.6 10*3/uL (ref 0.1–1.0)
Monocytes Relative: 7 %
Neutro Abs: 6.9 10*3/uL (ref 1.7–7.7)
Neutrophils Relative %: 84 %
Platelets: 184 10*3/uL (ref 150–400)
RBC: 5.02 MIL/uL (ref 3.87–5.11)
RDW: 14.7 % (ref 11.5–15.5)
WBC: 8.2 10*3/uL (ref 4.0–10.5)
nRBC: 0.2 % (ref 0.0–0.2)

## 2020-02-24 LAB — MAGNESIUM: Magnesium: 1.7 mg/dL (ref 1.7–2.4)

## 2020-02-24 MED ORDER — DEXAMETHASONE 4 MG PO TABS
6.0000 mg | ORAL_TABLET | Freq: Every day | ORAL | Status: DC
  Administered 2020-02-24 – 2020-02-25 (×2): 6 mg via ORAL
  Filled 2020-02-24 (×2): qty 2

## 2020-02-24 MED ORDER — CHLORHEXIDINE GLUCONATE 0.12 % MT SOLN
15.0000 mL | Freq: Two times a day (BID) | OROMUCOSAL | Status: DC
  Administered 2020-02-24 – 2020-02-25 (×4): 15 mL via OROMUCOSAL
  Filled 2020-02-24 (×3): qty 15

## 2020-02-24 MED ORDER — ORAL CARE MOUTH RINSE
15.0000 mL | Freq: Two times a day (BID) | OROMUCOSAL | Status: DC
  Administered 2020-02-24 – 2020-02-25 (×3): 15 mL via OROMUCOSAL

## 2020-02-24 NOTE — Progress Notes (Signed)
PROGRESS NOTE    Monica Stewart  MEQ:[id number] DOB: [birth year] DOA: 02/23/2020 PCP: Baxter Hire, MD    Assessment & Plan:   Active Problems:   COPD exacerbation (HCC)    Monica Stewart is a 74 y.o. female with history of COPD on 2 L/min of oxygen at night and PRN, hypertension and GERD who presented with worsening shortness of breath from baseline.  Patient is up-to-date with her COVID-19 vaccination (05/2019)   # Acute on chronic hypoxic respiratory failure # Chronic hypoxic respiratory failure on 2 L of oxygen at night and PRN --treat underlying causes --Continue supplemental O2 to keep sats between 88-92%, wean as tolerated  COVID-19 infection: POA --No obvious chest x-ray findings. --CRP 11.5 --Remdesivir not started on admission.  Received decadron on admission. PLAN: --cont oral decadron --trend CRP  # Acute COPD exacerbation.   Likely triggered by  COVID-19 infection.  Chest x-ray reviewed showed no obvious findings suggesting interstitial pneumonitis. PLAN: --cont oral decadron --cont bronchodilators  #3. Hypokalemia Hypomag --Monitor and replete PRN  #4.  Chronic essential hypertension:  --cont home HCTZ, losartan  HLD --cont home statin   DVT prophylaxis: Lovenox SQ Code Status: Full code  Family Communication:  Status is: inpatient Dispo:   The patient is from: home Anticipated d/c is to: home Anticipated d/c date is: tomorrow Patient currently is not medically stable to d/c due to: covid and copd exacerbation, will monitor overnight to ensure not worsening hypoxia given pt's underlying lung issues.   Subjective and Interval History:  Pt reported feeling better since presentation.  No N/V/D.  Eating ok.   Objective: Vitals:   02/24/20 0900 02/24/20 1227 02/24/20 1713 02/24/20 1728  BP: 135/85 133/88  (!) 147/86  Pulse: 97 (!) 109 (!) 121 (!) 110  Resp:  20    Temp: 98.7 F (37.1 C) 98.6 F (37 C)  98.5 F (36.9 C)    TempSrc: Oral   Oral  SpO2: 97% 97% 95% 100%  Weight:      Height:        Intake/Output Summary (Last 24 hours) at 02/24/2020 1819 Last data filed at 02/24/2020 2979 Gross per 24 hour  Intake 856.91 ml  Output --  Net 856.91 ml   Filed Weights   02/23/20 1140 02/23/20 2024  Weight: 50.3 kg 50 kg    Examination:   Constitutional: NAD, AAOx3 HEENT: conjunctivae and lids normal, EOMI CV: No cyanosis.   RESP: no crackles, no wheezes, increased RR Extremities: No effusions, edema in BLE SKIN: warm, dry and intact Neuro: II - XII grossly intact.   Psych: Normal mood and affect.  Appropriate judgement and reason    Data Reviewed: I have personally reviewed following labs and imaging studies  CBC: Recent Labs  Lab 02/23/20 1145 02/24/20 0417  WBC 10.7* 8.2  NEUTROABS  --  6.9  HGB 14.6 14.9  HCT 42.8 45.8  MCV 87.9 91.2  PLT 194 892   Basic Metabolic Panel: Recent Labs  Lab 02/23/20 1145 02/23/20 1523 02/24/20 0417  NA 135  --  141  K 3.2*  --  3.7  CL 93*  --  96*  CO2 28  --  32  GLUCOSE 121*  --  94  BUN 17  --  16  CREATININE 0.73  --  0.59  CALCIUM 9.0  --  8.9  MG  --  1.4* 1.7   GFR: Estimated Creatinine Clearance: 50.2 mL/min (by C-G formula based on  SCr of 0.59 mg/dL). Liver Function Tests: Recent Labs  Lab 02/24/20 0417  AST 39  ALT 30  ALKPHOS 56  BILITOT 0.4  PROT 7.6  ALBUMIN 3.6   No results for input(s): LIPASE, AMYLASE in the last 168 hours. No results for input(s): AMMONIA in the last 168 hours. Coagulation Profile: No results for input(s): INR, PROTIME in the last 168 hours. Cardiac Enzymes: No results for input(s): CKTOTAL, CKMB, CKMBINDEX, TROPONINI in the last 168 hours. BNP (last 3 results) No results for input(s): PROBNP in the last 8760 hours. HbA1C: No results for input(s): HGBA1C in the last 72 hours. CBG: No results for input(s): GLUCAP in the last 168 hours. Lipid Profile: Recent Labs    02/23/20 1253   TRIG 46   Thyroid Function Tests: No results for input(s): TSH, T4TOTAL, FREET4, T3FREE, THYROIDAB in the last 72 hours. Anemia Panel: Recent Labs    02/23/20 1253  FERRITIN 767*   Sepsis Labs: Recent Labs  Lab 02/23/20 1253  PROCALCITON 0.19  LATICACIDVEN 1.0    Recent Results (from the past 240 hour(s))  Resp Panel by RT-PCR (Flu A&B, Covid) Nasopharyngeal Swab     Status: Abnormal   Collection Time: 02/23/20 12:53 PM   Specimen: Nasopharyngeal Swab; Nasopharyngeal(NP) swabs in vial transport medium  Result Value Ref Range Status   SARS Coronavirus 2 by RT PCR POSITIVE (A) NEGATIVE Final    Comment: RESULT CALLED TO, READ BACK BY AND VERIFIED WITH: JENNIFER WHITLEY AT 5852 02/23/20.PMF (NOTE) SARS-CoV-2 target nucleic acids are DETECTED.  The SARS-CoV-2 RNA is generally detectable in upper respiratory specimens during the acute phase of infection. Positive results are indicative of the presence of the identified virus, but do not rule out bacterial infection or co-infection with other pathogens not detected by the test. Clinical correlation with patient history and other diagnostic information is necessary to determine patient infection status. The expected result is Negative.  Fact Sheet for Patients: EntrepreneurPulse.com.au  Fact Sheet for Healthcare Providers: IncredibleEmployment.be  This test is not yet approved or cleared by the [place] FDA and  has been authorized for detection and/or diagnosis of SARS-CoV-2 by FDA under an Emergency Use Authorization (EUA).  This EUA will remain in effect (meaning this test ca n be used) for the duration of  the COVID-19 declaration under Section 564(b)(1) of the Act, 21 U.S.C. section 360bbb-3(b)(1), unless the authorization is terminated or revoked sooner.     Influenza A by PCR NEGATIVE NEGATIVE Final   Influenza B by PCR NEGATIVE NEGATIVE Final    Comment: (NOTE) The  Xpert Xpress SARS-CoV-2/FLU/RSV plus assay is intended as an aid in the diagnosis of influenza from Nasopharyngeal swab specimens and should not be used as a sole basis for treatment. Nasal washings and aspirates are unacceptable for Xpert Xpress SARS-CoV-2/FLU/RSV testing.  Fact Sheet for Patients: EntrepreneurPulse.com.au  Fact Sheet for Healthcare Providers: IncredibleEmployment.be  This test is not yet approved or cleared by the [place] FDA and has been authorized for detection and/or diagnosis of SARS-CoV-2 by FDA under an Emergency Use Authorization (EUA). This EUA will remain in effect (meaning this test can be used) for the duration of the COVID-19 declaration under Section 564(b)(1) of the Act, 21 U.S.C. section 360bbb-3(b)(1), unless the authorization is terminated or revoked.  Performed at [facility], [address] [place], [place] [address]   Blood Culture (routine x 2)     Status: None (Preliminary result)   Collection Time: 02/23/20 12:53  PM   Specimen: BLOOD  Result Value Ref Range Status   Specimen Description BLOOD LEFT FORE ARM  Final   Special Requests   Final    BOTTLES DRAWN AEROBIC AND ANAEROBIC Blood Culture results may not be optimal due to an excessive volume of blood received in culture bottles   Culture   Final    NO GROWTH < 24 HOURS Performed at [facility], [address] [place], [place] [address]    Report Status PENDING  Incomplete  Blood Culture (routine x 2)     Status: None (Preliminary result)   Collection Time: 02/23/20 12:54 PM   Specimen: BLOOD  Result Value Ref Range Status   Specimen Description BLOOD LEFT HAND  Final   Special Requests   Final    BOTTLES DRAWN AEROBIC AND ANAEROBIC Blood Culture adequate volume   Culture   Final    NO GROWTH < 24 HOURS Performed at [facility], [address] [place], [place] [address]    Report Status PENDING   Incomplete      Radiology Studies: DG Chest 2 View  Result Date: 02/23/2020 CLINICAL DATA:  Shortness of breath, COVID-19 positive EXAM: CHEST - 2 VIEW COMPARISON:  04/13/2006, 01/22/2020 FINDINGS: The heart size and mediastinal contours are within normal limits. Atherosclerotic calcification of the aortic knob. Hyperinflated lungs with flattening of the diaphragms. No focal airspace consolidation, pleural effusion, or pneumothorax. The visualized skeletal structures are unremarkable. IMPRESSION: No acute cardiopulmonary findings. COPD. Electronically Signed   By: Davina Poke D.O.   On: 02/23/2020 12:30     Scheduled Meds: . aspirin EC  81 mg Oral Daily  . calcium citrate-vitamin D  1 tablet Oral BID  . chlorhexidine  15 mL Mouth Rinse BID  . dexamethasone  6 mg Oral Daily  . docusate sodium  100 mg Oral BID  . enoxaparin (LOVENOX) injection  40 mg Subcutaneous Q24H  . umeclidinium bromide  1 puff Inhalation Daily   And  . fluticasone furoate-vilanterol  1 puff Inhalation Daily  . folic acid  1 mg Oral Daily  . hydrochlorothiazide  25 mg Oral Daily  . Ipratropium-Albuterol  1 puff Inhalation Q6H  . loratadine  10 mg Oral Daily  . losartan  100 mg Oral Daily  . mouth rinse  15 mL Mouth Rinse q12n4p  . multivitamin with minerals  1 tablet Oral Daily  . pneumococcal 23 valent vaccine  0.5 mL Intramuscular Tomorrow-1000  . pravastatin  20 mg Oral q1800   Continuous Infusions:   LOS: 1 day     Enzo Bi, MD [facility] If 7PM-7AM, please contact night-coverage 02/24/2020, 6:19 PM

## 2020-02-24 NOTE — Progress Notes (Signed)
Patient continues to have Yellow MEWS due to HR. Patient A/O and no c/o pain or discomfort. Will continue to monitor.

## 2020-02-24 NOTE — Progress Notes (Signed)
SATURATION QUALIFICATIONS: (This note is used to comply with regulatory documentation for home oxygen)  Patient Saturations on Room Air at Rest = 97%  Patient Saturations on Room Air while Ambulating = 91%  Patient Saturations on 2 Liters of oxygen while Ambulating = 91%  Please briefly explain why patient needs home oxygen: Patient is on oxygen 2 LPM chronically. She is on 3 LPM here in hospital. Patient was ambulated with her normal 2 LPM on.

## 2020-02-24 NOTE — Progress Notes (Signed)
Patient has Yellow MEWS due to HR. Patient A/O and no c/o pain or discomfort. Will continue to monitor.

## 2020-02-25 LAB — BASIC METABOLIC PANEL
Anion gap: 14 (ref 5–15)
BUN: 19 mg/dL (ref 8–23)
CO2: 31 mmol/L (ref 22–32)
Calcium: 9.4 mg/dL (ref 8.9–10.3)
Chloride: 92 mmol/L — ABNORMAL LOW (ref 98–111)
Creatinine, Ser: 0.64 mg/dL (ref 0.44–1.00)
GFR, Estimated: >60 mL/min (ref >60)
Glucose, Bld: 83 mg/dL (ref 70–99)
Potassium: 3.8 mmol/L (ref 3.5–5.1)
Sodium: 137 mmol/L (ref 135–145)

## 2020-02-25 LAB — CBC
HCT: 45.8 % (ref 36.0–46.0)
Hemoglobin: 15.1 g/dL — ABNORMAL HIGH (ref 12.0–15.0)
MCH: 29.8 pg (ref 26.0–34.0)
MCHC: 33 g/dL (ref 30.0–36.0)
MCV: 90.5 fL (ref 80.0–100.0)
Platelets: 175 10*3/uL (ref 150–400)
RBC: 5.06 MIL/uL (ref 3.87–5.11)
RDW: 14.6 % (ref 11.5–15.5)
WBC: 9.8 10*3/uL (ref 4.0–10.5)
nRBC: 0 % (ref 0.0–0.2)

## 2020-02-25 LAB — MAGNESIUM: Magnesium: 1.7 mg/dL (ref 1.7–2.4)

## 2020-02-25 LAB — C-REACTIVE PROTEIN: CRP: 12.3 mg/dL — ABNORMAL HIGH (ref <1.0)

## 2020-02-25 MED ORDER — IPRATROPIUM-ALBUTEROL 20-100 MCG/ACT IN AERS: 1.0000 | INHALATION_SPRAY | Freq: Four times a day (QID) | RESPIRATORY_TRACT | 0 refills | Status: DC

## 2020-02-25 MED ORDER — HYDROCOD POLST-CPM POLST ER 10-8 MG/5ML PO SUER: 5.0000 mL | Freq: Two times a day (BID) | ORAL | 0 refills | Status: AC | PRN

## 2020-02-25 MED ORDER — GUAIFENESIN-DM 100-10 MG/5ML PO SYRP: 10.0000 mL | ORAL_SOLUTION | ORAL | 0 refills | Status: DC | PRN

## 2020-02-25 MED ORDER — PNEUMOCOCCAL VAC POLYVALENT 25 MCG/0.5ML IJ INJ: 0.5000 mL | INJECTION | Freq: Once | INTRAMUSCULAR | Status: DC

## 2020-02-25 MED ORDER — DEXAMETHASONE 6 MG PO TABS: 6.0000 mg | ORAL_TABLET | Freq: Every day | ORAL | 0 refills | Status: AC

## 2020-02-25 MED ORDER — PNEUMOCOCCAL VAC POLYVALENT 25 MCG/0.5ML IJ INJ: 0.5000 mL | INJECTION | Freq: Once | INTRAMUSCULAR | 0 refills | Status: AC

## 2020-02-25 NOTE — Discharge Summary (Signed)
Physician Discharge Summary   Monica Stewart  female DOB: 1945/06/03  YIF:[id number]  PCP: Baxter Hire, MD  Admit date: 02/23/2020 Discharge date: 02/25/2020  Admitted From: home Disposition:  home CODE STATUS: Full code  Discharge Instructions    Discharge instructions   Complete by: As directed    You have the COVID infection which may have triggered a mild COPD flare up, but currently you are not needing more than your home 2 liters of oxygen, and your chest xray was clear, so you can go home to recover.  Please take 5 more days of steroid Decadron to help reduce inflammation.   Dr. Enzo Bi [facility] Course:  For full details, please see H&P, progress notes, consult notes and ancillary notes.  Briefly,  Monica Stewart a 74 y.o.femalewith history of COPD on 2 L/min of oxygen at night and PRN, hypertension and GERD who presented with worsening shortness of breath from baseline.  Patient is up-to-date with her COVID-19 vaccination (05/2019)   # Dyspnea 2/2 Acute COPD exacerbation # Chronic hypoxic respiratory failure on 2 L of oxygen at night and PRN Likely triggered byCOVID-19 infection. Pt did not have increased supplemental oxygen requirement.  Pt received oral decadron and DuoNeb with rapid improvement in her dyspnea.  Pt was discharged on home 2L O2.  COVID-19 infection:POA No obvious chest x-ray findings.  CRP 11.5.  Remdesivir not started on admission.  Received decadron on admission and discharged on 5 more days of Decadron.  Hypokalemia Hypomag repleted PRN  Chronic essential hypertension: Continued home HCTZ, losartan  HLD Continued home statin   Discharge Diagnoses:  Active Problems:   COPD exacerbation [facility])    Discharge Instructions:  Allergies as of 02/25/2020   No Known Allergies     Medication List    TAKE these medications   albuterol 108 (90 Base) MCG/ACT inhaler Commonly known as: VENTOLIN  HFA Inhale 1-2 puffs into the lungs every 6 (six) hours as needed for wheezing or shortness of breath.   alendronate 70 MG tablet Commonly known as: FOSAMAX Take 70 mg by mouth once a week.   amLODipine 2.5 MG tablet Commonly known as: NORVASC Take 2.5 mg by mouth daily.   calcium citrate-vitamin D 500-400 MG-UNIT chewable tablet Chew 1 tablet by mouth daily.   chlorpheniramine-HYDROcodone 10-8 MG/5ML Suer Commonly known as: TUSSIONEX Take 5 mLs by mouth every 12 (twelve) hours as needed for up to 7 days for cough.   dexamethasone 6 MG tablet Commonly known as: DECADRON Take 1 tablet (6 mg total) by mouth daily for 5 days. Steroid for reducing inflammation. Start taking on: February 26, 2020   fluticasone 50 MCG/ACT nasal spray Commonly known as: FLONASE Place 2 sprays into both nostrils daily.   guaiFENesin-dextromethorphan 100-10 MG/5ML syrup Commonly known as: ROBITUSSIN DM Take 10 mLs by mouth every 4 (four) hours as needed for cough.   hydrochlorothiazide 25 MG tablet Commonly known as: HYDRODIURIL Take 25 mg by mouth daily.   Ipratropium-Albuterol 20-100 MCG/ACT Aers respimat Commonly known as: COMBIVENT Inhale 1 puff into the lungs every 6 (six) hours for 7 days.   loratadine 10 MG tablet Commonly known as: CLARITIN Take 10 mg by mouth daily.   losartan 100 MG tablet Commonly known as: COZAAR Take 100 mg by mouth daily.   lovastatin 40 MG tablet Commonly known as: MEVACOR Take 40 mg by mouth at bedtime.   multivitamin with minerals  tablet Take 1 tablet by mouth daily.   pneumococcal 23 valent vaccine 25 MCG/0.5ML injection Commonly known as: PNEUMOVAX-23 Inject 0.5 mLs into the muscle once for 1 dose.   predniSONE 5 MG tablet Commonly known as: DELTASONE Take 5 mg by mouth daily.   Trelegy Ellipta 100-62.5-25 MCG/INH Aepb Generic drug: Fluticasone-Umeclidin-Vilant Inhale 1 puff into the lungs daily.        Follow-up Information    Baxter Hire, MD. Schedule an appointment as soon as possible for a visit in 1 week(s).   Specialty: Internal Medicine Contact information: [place] [address] [phone]               No Known Allergies   The results of significant diagnostics from this hospitalization (including imaging, microbiology, ancillary and laboratory) are listed below for reference.   Consultations:   Procedures/Studies: DG Chest 2 View  Result Date: 02/23/2020 CLINICAL DATA:  Shortness of breath, COVID-19 positive EXAM: CHEST - 2 VIEW COMPARISON:  04/13/2006, 01/22/2020 FINDINGS: The heart size and mediastinal contours are within normal limits. Atherosclerotic calcification of the aortic knob. Hyperinflated lungs with flattening of the diaphragms. No focal airspace consolidation, pleural effusion, or pneumothorax. The visualized skeletal structures are unremarkable. IMPRESSION: No acute cardiopulmonary findings. COPD. Electronically Signed   By: Davina Poke D.O.   On: 02/23/2020 12:30      Labs: BNP (last 3 results) No results for input(s): BNP in the last 8760 hours. Basic Metabolic Panel: Recent Labs  Lab 02/23/20 1145 02/23/20 1523 02/24/20 0417 02/25/20 0651  NA 135  --  141 137  K 3.2*  --  3.7 3.8  CL 93*  --  96* 92*  CO2 28  --  32 31  GLUCOSE 121*  --  94 83  BUN 17  --  16 19  CREATININE 0.73  --  0.59 0.64  CALCIUM 9.0  --  8.9 9.4  MG  --  1.4* 1.7 1.7   Liver Function Tests: Recent Labs  Lab 02/24/20 0417  AST 39  ALT 30  ALKPHOS 56  BILITOT 0.4  PROT 7.6  ALBUMIN 3.6   No results for input(s): LIPASE, AMYLASE in the last 168 hours. No results for input(s): AMMONIA in the last 168 hours. CBC: Recent Labs  Lab 02/23/20 1145 02/24/20 0417 02/25/20 0651  WBC 10.7* 8.2 9.8  NEUTROABS  --  6.9  --   HGB 14.6 14.9 15.1*  HCT 42.8 45.8 45.8  MCV 87.9 91.2 90.5  PLT 194 184 175   Cardiac Enzymes: No results for input(s): CKTOTAL,  CKMB, CKMBINDEX, TROPONINI in the last 168 hours. BNP: Invalid input(s): POCBNP CBG: No results for input(s): GLUCAP in the last 168 hours. D-Dimer No results for input(s): DDIMER in the last 72 hours. Hgb A1c No results for input(s): HGBA1C in the last 72 hours. Lipid Profile Recent Labs    02/23/20 1253  TRIG 46   Thyroid function studies No results for input(s): TSH, T4TOTAL, T3FREE, THYROIDAB in the last 72 hours.  Invalid input(s): FREET3 Anemia work up Recent Labs    02/23/20 [place]*   Urinalysis No results found for: COLORURINE, APPEARANCEUR, LABSPEC, [place], GLUCOSEU, [place], [place], [place], PROTEINUR, UROBILINOGEN, NITRITE, LEUKOCYTESUR Sepsis Labs Invalid input(s): PROCALCITONIN,  WBC,  LACTICIDVEN Microbiology Recent Results (from the past 240 hour(s))  Resp Panel by RT-PCR (Flu A&B, Covid) Nasopharyngeal Swab     Status: Abnormal   Collection Time: 02/23/20 12:53  PM   Specimen: Nasopharyngeal Swab; Nasopharyngeal(NP) swabs in vial transport medium  Result Value Ref Range Status   SARS Coronavirus 2 by RT PCR POSITIVE (A) NEGATIVE Final    Comment: RESULT CALLED TO, READ BACK BY AND VERIFIED WITH: JENNIFER WHITLEY AT 8185 02/23/20.PMF (NOTE) SARS-CoV-2 target nucleic acids are DETECTED.  The SARS-CoV-2 RNA is generally detectable in upper respiratory specimens during the acute phase of infection. Positive results are indicative of the presence of the identified virus, but do not rule out bacterial infection or co-infection with other pathogens not detected by the test. Clinical correlation with patient history and other diagnostic information is necessary to determine patient infection status. The expected result is Negative.  Fact Sheet for Patients: EntrepreneurPulse.com.au  Fact Sheet for Healthcare Providers: IncredibleEmployment.be  This test is not yet approved or cleared by the [place] FDA and  has been authorized for detection and/or diagnosis of SARS-CoV-2 by FDA under an Emergency Use Authorization (EUA).  This EUA will remain in effect (meaning this test ca n be used) for the duration of  the COVID-19 declaration under Section 564(b)(1) of the Act, 21 U.S.C. section 360bbb-3(b)(1), unless the authorization is terminated or revoked sooner.     Influenza A by PCR NEGATIVE NEGATIVE Final   Influenza B by PCR NEGATIVE NEGATIVE Final    Comment: (NOTE) The Xpert Xpress SARS-CoV-2/FLU/RSV plus assay is intended as an aid in the diagnosis of influenza from Nasopharyngeal swab specimens and should not be used as a sole basis for treatment. Nasal washings and aspirates are unacceptable for Xpert Xpress SARS-CoV-2/FLU/RSV testing.  Fact Sheet for Patients: EntrepreneurPulse.com.au  Fact Sheet for Healthcare Providers: IncredibleEmployment.be  This test is not yet approved or cleared by the [place] FDA and has been authorized for detection and/or diagnosis of SARS-CoV-2 by FDA under an Emergency Use Authorization (EUA). This EUA will remain in effect (meaning this test can be used) for the duration of the COVID-19 declaration under Section 564(b)(1) of the Act, 21 U.S.C. section 360bbb-3(b)(1), unless the authorization is terminated or revoked.  Performed at [facility], [address] [place], [place] [address]   Blood Culture (routine x 2)     Status: None (Preliminary result)   Collection Time: 02/23/20 12:53 PM   Specimen: BLOOD  Result Value Ref Range Status   Specimen Description BLOOD LEFT FORE ARM  Final   Special Requests   Final    BOTTLES DRAWN AEROBIC AND ANAEROBIC Blood Culture results may not be optimal due to an excessive volume of blood received in culture bottles   Culture   Final    NO GROWTH 2 DAYS Performed at [facility], [address] [place], [place] [address]     Report Status PENDING  Incomplete  Blood Culture (routine x 2)     Status: None (Preliminary result)   Collection Time: 02/23/20 12:54 PM   Specimen: BLOOD  Result Value Ref Range Status   Specimen Description BLOOD LEFT HAND  Final   Special Requests   Final    BOTTLES DRAWN AEROBIC AND ANAEROBIC Blood Culture adequate volume   Culture   Final    NO GROWTH 2 DAYS Performed at [facility], [address], [place], [place] [address]    Report Status PENDING  Incomplete     Total time spend on discharging this patient, including the last patient exam, discussing the hospital stay, instructions for ongoing care as it relates to all pertinent  caregivers, as well as preparing the medical discharge records, prescriptions, and/or referrals as applicable, is 35 minutes.    Enzo Bi, MD  [facility] 02/25/2020, 9:48 AM

## 2020-02-28 LAB — CULTURE, BLOOD (ROUTINE X 2)
Culture: NO GROWTH
Culture: NO GROWTH
Special Requests: ADEQUATE

## 2020-08-19 ENCOUNTER — Other Ambulatory Visit: Payer: Self-pay | Admitting: Internal Medicine

## 2020-08-19 DIAGNOSIS — Z1231 Encounter for screening mammogram for malignant neoplasm of breast: Secondary | ICD-10-CM

## 2020-10-06 ENCOUNTER — Ambulatory Visit
Admission: RE | Admit: 2020-10-06 | Discharge: 2020-10-06 | Disposition: A | Discharge status: Home / Self Care | Payer: BC Managed Care – PPO | Source: Ambulatory Visit | Attending: Internal Medicine | Admitting: Internal Medicine

## 2020-10-06 ENCOUNTER — Other Ambulatory Visit: Payer: Self-pay

## 2020-10-06 DIAGNOSIS — Z1231 Encounter for screening mammogram for malignant neoplasm of breast: Secondary | ICD-10-CM | POA: Diagnosis not present

## 2020-10-13 ENCOUNTER — Other Ambulatory Visit: Payer: Self-pay | Admitting: Internal Medicine

## 2020-10-13 DIAGNOSIS — R928 Other abnormal and inconclusive findings on diagnostic imaging of breast: Secondary | ICD-10-CM

## 2020-10-13 DIAGNOSIS — N6489 Other specified disorders of breast: Secondary | ICD-10-CM

## 2020-10-21 ENCOUNTER — Ambulatory Visit
Admission: RE | Admit: 2020-10-21 | Discharge: 2020-10-21 | Disposition: A | Discharge status: Home / Self Care | Payer: BC Managed Care – PPO | Source: Ambulatory Visit | Attending: Internal Medicine | Admitting: Internal Medicine

## 2020-10-21 ENCOUNTER — Other Ambulatory Visit: Payer: Self-pay

## 2020-10-21 DIAGNOSIS — N6489 Other specified disorders of breast: Secondary | ICD-10-CM | POA: Diagnosis present

## 2020-10-21 DIAGNOSIS — R928 Other abnormal and inconclusive findings on diagnostic imaging of breast: Secondary | ICD-10-CM | POA: Insufficient documentation

## 2021-05-06 DIAGNOSIS — Z9981 Dependence on supplemental oxygen: Secondary | ICD-10-CM | POA: Diagnosis not present

## 2021-05-06 DIAGNOSIS — R059 Cough, unspecified: Secondary | ICD-10-CM | POA: Diagnosis not present

## 2021-05-06 DIAGNOSIS — J449 Chronic obstructive pulmonary disease, unspecified: Secondary | ICD-10-CM | POA: Diagnosis not present

## 2021-05-06 DIAGNOSIS — R053 Chronic cough: Secondary | ICD-10-CM | POA: Diagnosis not present

## 2021-05-06 DIAGNOSIS — R0609 Other forms of dyspnea: Secondary | ICD-10-CM | POA: Diagnosis not present

## 2021-05-13 DIAGNOSIS — E119 Type 2 diabetes mellitus without complications: Secondary | ICD-10-CM | POA: Diagnosis not present

## 2021-05-14 DIAGNOSIS — J449 Chronic obstructive pulmonary disease, unspecified: Secondary | ICD-10-CM | POA: Diagnosis not present

## 2021-05-20 DIAGNOSIS — Z1389 Encounter for screening for other disorder: Secondary | ICD-10-CM | POA: Diagnosis not present

## 2021-05-20 DIAGNOSIS — Z0000 Encounter for general adult medical examination without abnormal findings: Secondary | ICD-10-CM | POA: Diagnosis not present

## 2021-05-20 DIAGNOSIS — I10 Essential (primary) hypertension: Secondary | ICD-10-CM | POA: Diagnosis not present

## 2021-05-20 DIAGNOSIS — J9611 Chronic respiratory failure with hypoxia: Secondary | ICD-10-CM | POA: Diagnosis not present

## 2021-05-20 DIAGNOSIS — E782 Mixed hyperlipidemia: Secondary | ICD-10-CM | POA: Diagnosis not present

## 2021-05-20 DIAGNOSIS — J439 Emphysema, unspecified: Secondary | ICD-10-CM | POA: Diagnosis not present

## 2021-05-20 DIAGNOSIS — E119 Type 2 diabetes mellitus without complications: Secondary | ICD-10-CM | POA: Diagnosis not present

## 2021-06-11 DIAGNOSIS — J449 Chronic obstructive pulmonary disease, unspecified: Secondary | ICD-10-CM | POA: Diagnosis not present

## 2021-07-12 DIAGNOSIS — J449 Chronic obstructive pulmonary disease, unspecified: Secondary | ICD-10-CM | POA: Diagnosis not present

## 2021-08-04 DIAGNOSIS — J449 Chronic obstructive pulmonary disease, unspecified: Secondary | ICD-10-CM | POA: Diagnosis not present

## 2021-08-04 DIAGNOSIS — R0609 Other forms of dyspnea: Secondary | ICD-10-CM | POA: Diagnosis not present

## 2021-08-04 DIAGNOSIS — R918 Other nonspecific abnormal finding of lung field: Secondary | ICD-10-CM | POA: Diagnosis not present

## 2021-08-04 DIAGNOSIS — Z9981 Dependence on supplemental oxygen: Secondary | ICD-10-CM | POA: Diagnosis not present

## 2021-08-11 DIAGNOSIS — J449 Chronic obstructive pulmonary disease, unspecified: Secondary | ICD-10-CM | POA: Diagnosis not present

## 2021-09-11 DIAGNOSIS — J449 Chronic obstructive pulmonary disease, unspecified: Secondary | ICD-10-CM | POA: Diagnosis not present

## 2021-09-21 ENCOUNTER — Other Ambulatory Visit: Payer: Self-pay | Admitting: Internal Medicine

## 2021-09-21 DIAGNOSIS — Z1231 Encounter for screening mammogram for malignant neoplasm of breast: Secondary | ICD-10-CM

## 2021-09-21 DIAGNOSIS — E119 Type 2 diabetes mellitus without complications: Secondary | ICD-10-CM | POA: Diagnosis not present

## 2021-09-28 DIAGNOSIS — E782 Mixed hyperlipidemia: Secondary | ICD-10-CM | POA: Diagnosis not present

## 2021-09-28 DIAGNOSIS — J9611 Chronic respiratory failure with hypoxia: Secondary | ICD-10-CM | POA: Diagnosis not present

## 2021-09-28 DIAGNOSIS — J439 Emphysema, unspecified: Secondary | ICD-10-CM | POA: Diagnosis not present

## 2021-09-28 DIAGNOSIS — Z0001 Encounter for general adult medical examination with abnormal findings: Secondary | ICD-10-CM | POA: Diagnosis not present

## 2021-09-28 DIAGNOSIS — K219 Gastro-esophageal reflux disease without esophagitis: Secondary | ICD-10-CM | POA: Diagnosis not present

## 2021-09-28 DIAGNOSIS — E119 Type 2 diabetes mellitus without complications: Secondary | ICD-10-CM | POA: Diagnosis not present

## 2021-09-28 DIAGNOSIS — I10 Essential (primary) hypertension: Secondary | ICD-10-CM | POA: Diagnosis not present

## 2021-10-13 ENCOUNTER — Ambulatory Visit
Admission: RE | Admit: 2021-10-13 | Discharge: 2021-10-13 | Disposition: A | Discharge status: Home / Self Care | Payer: Medicare HMO | Source: Ambulatory Visit | Attending: Internal Medicine | Admitting: Internal Medicine

## 2021-10-13 DIAGNOSIS — Z1231 Encounter for screening mammogram for malignant neoplasm of breast: Secondary | ICD-10-CM | POA: Diagnosis not present

## 2021-12-14 DIAGNOSIS — J302 Other seasonal allergic rhinitis: Secondary | ICD-10-CM | POA: Diagnosis not present

## 2021-12-14 DIAGNOSIS — R918 Other nonspecific abnormal finding of lung field: Secondary | ICD-10-CM | POA: Diagnosis not present

## 2021-12-14 DIAGNOSIS — Z23 Encounter for immunization: Secondary | ICD-10-CM | POA: Diagnosis not present

## 2021-12-14 DIAGNOSIS — J449 Chronic obstructive pulmonary disease, unspecified: Secondary | ICD-10-CM | POA: Diagnosis not present

## 2021-12-14 DIAGNOSIS — M542 Cervicalgia: Secondary | ICD-10-CM | POA: Diagnosis not present

## 2022-01-26 DIAGNOSIS — E119 Type 2 diabetes mellitus without complications: Secondary | ICD-10-CM | POA: Diagnosis not present

## 2022-02-02 DIAGNOSIS — E782 Mixed hyperlipidemia: Secondary | ICD-10-CM | POA: Diagnosis not present

## 2022-02-02 DIAGNOSIS — M810 Age-related osteoporosis without current pathological fracture: Secondary | ICD-10-CM | POA: Diagnosis not present

## 2022-02-02 DIAGNOSIS — J9611 Chronic respiratory failure with hypoxia: Secondary | ICD-10-CM | POA: Diagnosis not present

## 2022-02-02 DIAGNOSIS — K219 Gastro-esophageal reflux disease without esophagitis: Secondary | ICD-10-CM | POA: Diagnosis not present

## 2022-02-02 DIAGNOSIS — J439 Emphysema, unspecified: Secondary | ICD-10-CM | POA: Diagnosis not present

## 2022-02-02 DIAGNOSIS — E119 Type 2 diabetes mellitus without complications: Secondary | ICD-10-CM | POA: Diagnosis not present

## 2022-03-18 ENCOUNTER — Other Ambulatory Visit: Payer: Self-pay | Admitting: Specialist

## 2022-03-18 DIAGNOSIS — J449 Chronic obstructive pulmonary disease, unspecified: Secondary | ICD-10-CM | POA: Diagnosis not present

## 2022-03-18 DIAGNOSIS — Z9981 Dependence on supplemental oxygen: Secondary | ICD-10-CM | POA: Diagnosis not present

## 2022-03-18 DIAGNOSIS — R918 Other nonspecific abnormal finding of lung field: Secondary | ICD-10-CM

## 2022-03-18 DIAGNOSIS — R0602 Shortness of breath: Secondary | ICD-10-CM | POA: Diagnosis not present

## 2022-04-06 ENCOUNTER — Ambulatory Visit
Admission: RE | Admit: 2022-04-06 | Discharge: 2022-04-06 | Disposition: A | Discharge status: Home / Self Care | Payer: Medicare HMO | Source: Ambulatory Visit | Attending: Specialist | Admitting: Specialist

## 2022-04-06 DIAGNOSIS — J449 Chronic obstructive pulmonary disease, unspecified: Secondary | ICD-10-CM | POA: Diagnosis not present

## 2022-04-06 DIAGNOSIS — R918 Other nonspecific abnormal finding of lung field: Secondary | ICD-10-CM

## 2022-04-06 DIAGNOSIS — J432 Centrilobular emphysema: Secondary | ICD-10-CM | POA: Diagnosis not present

## 2022-05-27 DIAGNOSIS — E119 Type 2 diabetes mellitus without complications: Secondary | ICD-10-CM | POA: Diagnosis not present

## 2022-06-03 DIAGNOSIS — I10 Essential (primary) hypertension: Secondary | ICD-10-CM | POA: Diagnosis not present

## 2022-06-03 DIAGNOSIS — Z0000 Encounter for general adult medical examination without abnormal findings: Secondary | ICD-10-CM | POA: Diagnosis not present

## 2022-06-03 DIAGNOSIS — K219 Gastro-esophageal reflux disease without esophagitis: Secondary | ICD-10-CM | POA: Diagnosis not present

## 2022-06-03 DIAGNOSIS — E119 Type 2 diabetes mellitus without complications: Secondary | ICD-10-CM | POA: Diagnosis not present

## 2022-06-03 DIAGNOSIS — J9611 Chronic respiratory failure with hypoxia: Secondary | ICD-10-CM | POA: Diagnosis not present

## 2022-06-03 DIAGNOSIS — Z1331 Encounter for screening for depression: Secondary | ICD-10-CM | POA: Diagnosis not present

## 2022-06-03 DIAGNOSIS — J449 Chronic obstructive pulmonary disease, unspecified: Secondary | ICD-10-CM | POA: Diagnosis not present

## 2022-06-03 DIAGNOSIS — J439 Emphysema, unspecified: Secondary | ICD-10-CM | POA: Diagnosis not present

## 2022-06-03 DIAGNOSIS — Z87891 Personal history of nicotine dependence: Secondary | ICD-10-CM | POA: Diagnosis not present

## 2022-06-28 ENCOUNTER — Other Ambulatory Visit: Payer: Self-pay | Admitting: Specialist

## 2022-06-28 DIAGNOSIS — R918 Other nonspecific abnormal finding of lung field: Secondary | ICD-10-CM

## 2022-07-07 ENCOUNTER — Ambulatory Visit
Admission: RE | Admit: 2022-07-07 | Discharge: 2022-07-07 | Disposition: A | Discharge status: Home / Self Care | Payer: Medicare HMO | Source: Ambulatory Visit | Attending: Specialist | Admitting: Specialist

## 2022-07-07 DIAGNOSIS — R918 Other nonspecific abnormal finding of lung field: Secondary | ICD-10-CM | POA: Diagnosis not present

## 2022-07-07 DIAGNOSIS — J439 Emphysema, unspecified: Secondary | ICD-10-CM | POA: Diagnosis not present

## 2022-07-16 DIAGNOSIS — J449 Chronic obstructive pulmonary disease, unspecified: Secondary | ICD-10-CM | POA: Diagnosis not present

## 2022-07-16 DIAGNOSIS — Z9981 Dependence on supplemental oxygen: Secondary | ICD-10-CM | POA: Diagnosis not present

## 2022-07-16 DIAGNOSIS — R918 Other nonspecific abnormal finding of lung field: Secondary | ICD-10-CM | POA: Diagnosis not present

## 2022-07-16 DIAGNOSIS — R0609 Other forms of dyspnea: Secondary | ICD-10-CM | POA: Diagnosis not present

## 2022-09-08 ENCOUNTER — Other Ambulatory Visit: Payer: Self-pay | Admitting: Internal Medicine

## 2022-09-08 DIAGNOSIS — Z1231 Encounter for screening mammogram for malignant neoplasm of breast: Secondary | ICD-10-CM

## 2022-09-29 DIAGNOSIS — E119 Type 2 diabetes mellitus without complications: Secondary | ICD-10-CM | POA: Diagnosis not present

## 2022-10-06 DIAGNOSIS — I10 Essential (primary) hypertension: Secondary | ICD-10-CM | POA: Diagnosis not present

## 2022-10-06 DIAGNOSIS — Z0001 Encounter for general adult medical examination with abnormal findings: Secondary | ICD-10-CM | POA: Diagnosis not present

## 2022-10-06 DIAGNOSIS — J439 Emphysema, unspecified: Secondary | ICD-10-CM | POA: Diagnosis not present

## 2022-10-06 DIAGNOSIS — J9611 Chronic respiratory failure with hypoxia: Secondary | ICD-10-CM | POA: Diagnosis not present

## 2022-10-06 DIAGNOSIS — E782 Mixed hyperlipidemia: Secondary | ICD-10-CM | POA: Diagnosis not present

## 2022-10-06 DIAGNOSIS — E119 Type 2 diabetes mellitus without complications: Secondary | ICD-10-CM | POA: Diagnosis not present

## 2022-10-15 ENCOUNTER — Ambulatory Visit
Admission: RE | Admit: 2022-10-15 | Discharge: 2022-10-15 | Disposition: A | Discharge status: Home / Self Care | Payer: Medicare HMO | Source: Ambulatory Visit | Attending: Internal Medicine | Admitting: Internal Medicine

## 2022-10-15 DIAGNOSIS — Z1231 Encounter for screening mammogram for malignant neoplasm of breast: Secondary | ICD-10-CM | POA: Diagnosis not present

## 2022-10-26 DIAGNOSIS — J301 Allergic rhinitis due to pollen: Secondary | ICD-10-CM | POA: Diagnosis not present

## 2022-10-26 DIAGNOSIS — J449 Chronic obstructive pulmonary disease, unspecified: Secondary | ICD-10-CM | POA: Diagnosis not present

## 2022-10-26 DIAGNOSIS — R918 Other nonspecific abnormal finding of lung field: Secondary | ICD-10-CM | POA: Diagnosis not present

## 2022-10-26 DIAGNOSIS — Z9981 Dependence on supplemental oxygen: Secondary | ICD-10-CM | POA: Diagnosis not present

## 2023-01-25 DIAGNOSIS — J449 Chronic obstructive pulmonary disease, unspecified: Secondary | ICD-10-CM | POA: Diagnosis not present

## 2023-01-25 DIAGNOSIS — Z9981 Dependence on supplemental oxygen: Secondary | ICD-10-CM | POA: Diagnosis not present

## 2023-01-25 DIAGNOSIS — R600 Localized edema: Secondary | ICD-10-CM | POA: Diagnosis not present

## 2023-01-25 DIAGNOSIS — J301 Allergic rhinitis due to pollen: Secondary | ICD-10-CM | POA: Diagnosis not present

## 2023-02-04 DIAGNOSIS — E119 Type 2 diabetes mellitus without complications: Secondary | ICD-10-CM | POA: Diagnosis not present

## 2023-02-11 DIAGNOSIS — E119 Type 2 diabetes mellitus without complications: Secondary | ICD-10-CM | POA: Diagnosis not present

## 2023-02-11 DIAGNOSIS — M810 Age-related osteoporosis without current pathological fracture: Secondary | ICD-10-CM | POA: Diagnosis not present

## 2023-02-11 DIAGNOSIS — E782 Mixed hyperlipidemia: Secondary | ICD-10-CM | POA: Diagnosis not present

## 2023-02-11 DIAGNOSIS — I10 Essential (primary) hypertension: Secondary | ICD-10-CM | POA: Diagnosis not present

## 2023-02-11 DIAGNOSIS — J9611 Chronic respiratory failure with hypoxia: Secondary | ICD-10-CM | POA: Diagnosis not present

## 2023-02-11 DIAGNOSIS — J439 Emphysema, unspecified: Secondary | ICD-10-CM | POA: Diagnosis not present

## 2023-04-26 DIAGNOSIS — R600 Localized edema: Secondary | ICD-10-CM | POA: Diagnosis not present

## 2023-04-26 DIAGNOSIS — J449 Chronic obstructive pulmonary disease, unspecified: Secondary | ICD-10-CM | POA: Diagnosis not present

## 2023-04-26 DIAGNOSIS — R0602 Shortness of breath: Secondary | ICD-10-CM | POA: Diagnosis not present

## 2023-04-26 DIAGNOSIS — R918 Other nonspecific abnormal finding of lung field: Secondary | ICD-10-CM | POA: Diagnosis not present

## 2023-06-09 DIAGNOSIS — I10 Essential (primary) hypertension: Secondary | ICD-10-CM | POA: Diagnosis not present

## 2023-06-09 DIAGNOSIS — E119 Type 2 diabetes mellitus without complications: Secondary | ICD-10-CM | POA: Diagnosis not present

## 2023-06-15 ENCOUNTER — Other Ambulatory Visit: Payer: Self-pay | Admitting: Specialist

## 2023-06-15 DIAGNOSIS — J449 Chronic obstructive pulmonary disease, unspecified: Secondary | ICD-10-CM

## 2023-06-15 DIAGNOSIS — R918 Other nonspecific abnormal finding of lung field: Secondary | ICD-10-CM

## 2023-06-16 DIAGNOSIS — E782 Mixed hyperlipidemia: Secondary | ICD-10-CM | POA: Diagnosis not present

## 2023-06-16 DIAGNOSIS — I10 Essential (primary) hypertension: Secondary | ICD-10-CM | POA: Diagnosis not present

## 2023-06-16 DIAGNOSIS — Z0000 Encounter for general adult medical examination without abnormal findings: Secondary | ICD-10-CM | POA: Diagnosis not present

## 2023-06-16 DIAGNOSIS — K219 Gastro-esophageal reflux disease without esophagitis: Secondary | ICD-10-CM | POA: Diagnosis not present

## 2023-06-16 DIAGNOSIS — J439 Emphysema, unspecified: Secondary | ICD-10-CM | POA: Diagnosis not present

## 2023-06-16 DIAGNOSIS — J9611 Chronic respiratory failure with hypoxia: Secondary | ICD-10-CM | POA: Diagnosis not present

## 2023-06-16 DIAGNOSIS — E119 Type 2 diabetes mellitus without complications: Secondary | ICD-10-CM | POA: Diagnosis not present

## 2023-06-16 DIAGNOSIS — Z1331 Encounter for screening for depression: Secondary | ICD-10-CM | POA: Diagnosis not present

## 2023-06-27 DIAGNOSIS — R918 Other nonspecific abnormal finding of lung field: Secondary | ICD-10-CM | POA: Diagnosis not present

## 2023-06-27 DIAGNOSIS — J449 Chronic obstructive pulmonary disease, unspecified: Secondary | ICD-10-CM | POA: Diagnosis not present

## 2023-06-27 DIAGNOSIS — J441 Chronic obstructive pulmonary disease with (acute) exacerbation: Secondary | ICD-10-CM | POA: Diagnosis not present

## 2023-06-27 DIAGNOSIS — J301 Allergic rhinitis due to pollen: Secondary | ICD-10-CM | POA: Diagnosis not present

## 2023-09-23 ENCOUNTER — Other Ambulatory Visit: Payer: Self-pay | Admitting: Internal Medicine

## 2023-09-23 DIAGNOSIS — Z1231 Encounter for screening mammogram for malignant neoplasm of breast: Secondary | ICD-10-CM

## 2023-09-26 DIAGNOSIS — J301 Allergic rhinitis due to pollen: Secondary | ICD-10-CM | POA: Diagnosis not present

## 2023-09-26 DIAGNOSIS — R600 Localized edema: Secondary | ICD-10-CM | POA: Diagnosis not present

## 2023-09-26 DIAGNOSIS — Z9981 Dependence on supplemental oxygen: Secondary | ICD-10-CM | POA: Diagnosis not present

## 2023-09-26 DIAGNOSIS — R918 Other nonspecific abnormal finding of lung field: Secondary | ICD-10-CM | POA: Diagnosis not present

## 2023-09-26 DIAGNOSIS — J449 Chronic obstructive pulmonary disease, unspecified: Secondary | ICD-10-CM | POA: Diagnosis not present

## 2023-09-26 NOTE — Progress Notes (Signed)
 Follow-up and COPD   History of Present Illness: Monica Stewart is a 78 y.o. female who presents to clinic for follow up. Very severe copd, on oxygen , low dose prednisone . Heat affects her breathing. She is c/o difficulty swallowing water.   Problem List:  Patient Active Problem List  Diagnosis  . Hypertension  . Hyperlipidemia  . COPD (chronic obstructive pulmonary disease) (CMS/HHS-HCC)  . Asthma without status asthmaticus (HHS-HCC)  . GERD (gastroesophageal reflux disease)  . Anemia  . History of vitamin D  deficiency  . Age related osteoporosis  . Pelvic mass  . Controlled type 2 diabetes mellitus without complication, without long-term current use of insulin  (CMS/HHS-HCC)  . Chronic respiratory failure with hypoxia (CMS/HHS-HCC)    Current Medications:  Current Outpatient Medications  Medication Sig Dispense Refill  . albuterol  (PROVENTIL ) 2.5 mg /3 mL (0.083 %) nebulizer solution Take 3 mLs (2.5 mg total) by nebulization 4 (four) times daily 360 mL 6  . albuterol  90 mcg/actuation inhaler Inhale 2 inhalations into the lungs 4 (four) times daily as needed for Wheezing 1 each 5  . albuterol  MDI, PROVENTIL , VENTOLIN , PROAIR , HFA 90 mcg/actuation inhaler Inhale 2 inhalations into the lungs every 6 (six) hours as needed for Wheezing 1 each 6  . alendronate (FOSAMAX) 70 MG tablet Take 1 tablet (70 mg total) by mouth every 7 (seven) days 12 tablet 3  . amLODIPine (NORVASC) 2.5 MG tablet Take 1 tablet (2.5 mg total) by mouth once daily 90 tablet 3  . azelastine (ASTELIN) 137 mcg nasal spray Place 1 spray into both nostrils 2 (two) times daily 30 mL 5  . calcium  carbonate-vitamin D3 (CALCIUM -VITAMIN D ) 500 mg-10 mcg (400 unit) tablet Take 1 tablet by mouth once daily 30 tablet 11  . fexofenadine (ALLEGRA) 180 MG tablet Take 1 tablet (180 mg total) by mouth once daily 30 tablet 2  . fluticasone  propionate (FLONASE ) 50 mcg/actuation nasal spray Place 2 sprays into both nostrils once daily  16 g 5  . fluticasone -umeclidinium-vilanterol (TRELEGY ELLIPTA) 100-62.5-25 mcg inhaler Inhale 1 Puff into the lungs once daily 1 each 11  . FUROsemide  (LASIX ) 20 MG tablet Take 1 tablet (20 mg total) by mouth once daily 30 tablet 2  . hydroCHLOROthiazide  (HYDRODIURIL ) 25 MG tablet Take 1 tablet (25 mg total) by mouth once daily 90 tablet 3  . loratadine  (CLARITIN ) 10 mg tablet Take 1 tablet (10 mg total) by mouth once daily 90 tablet 3  . losartan  (COZAAR ) 100 MG tablet Take 1 tablet (100 mg total) by mouth once daily 90 tablet 3  . lovastatin (MEVACOR) 40 MG tablet Take 1 tablet (40 mg total) by mouth daily with dinner 90 tablet 3  . meloxicam (MOBIC) 7.5 MG tablet Take 1 tablet (7.5 mg total) by mouth once daily 30 tablet 3  . multivitamin tablet Take 1 tablet by mouth once daily.    . predniSONE  (DELTASONE ) 5 MG tablet Take 1 tablet (5 mg total) by mouth once daily 30 tablet 4  . roflumilast (DALIRESP) 250 mcg Tab Take 250 mcg by mouth once daily 30 tablet 6   No current facility-administered medications for this visit.    History: Past Medical History:  Diagnosis Date  . Anemia   . Asthma without status asthmaticus (HHS-HCC)   . Chest pain 2015   Stress test neg  . COPD (chronic obstructive pulmonary disease) (CMS/HHS-HCC)   . GERD (gastroesophageal reflux disease)   . Hyperlipidemia   . Hypertension   . Murmur  Past Surgical History:  Procedure Laterality Date  . EGD  04/05/02  . COLONOSCOPY  04/05/02; 10/12/12   Internal hemorrhoids  . TUBAL LIGATION      Family History  Problem Relation Name Age of Onset  . Ovarian cancer Mother    . High blood pressure (Hypertension) Mother    . Kidney failure Father    . High blood pressure (Hypertension) Sister Ronal Silversmith      reports that she quit smoking about 34 years ago. Her smoking use included cigarettes. She started smoking about 59 years ago. She has a 25 pack-year smoking history. She has never used smokeless tobacco.  She reports that she does not drink alcohol  and does not use drugs.   Review of Systems: As per above. All systems were reviewed with her in totality and there were no further positive findings. Presently no cp, n,v,c,, gerd abdomen pain. No bleeding or bruising. No dysuria, hematuria, fever, chills, nor sweats. No lymph nodes, no rashes, TIAs, seizures, passing out spells, or suicidal ideation.   Physical Exam: BP (!) 148/82 (BP Location: Left upper arm, Patient Position: Sitting, BP Cuff Size: Adult)   Pulse (!) 118   Ht 162.6 cm (5' 4)   Wt 47.6 kg (105 lb) Comment: per patient  SpO2 97% Comment: room air  BMI 18.02 kg/m  47.6 kg (105 lb) 97% General:  NAD. Able to speak in complete sentences without cough or dyspnea HEENT: Normocephalic, nontraumatic. Extraocular movements intact NECK: Supple. No JVD, nodes, thryomegaly CV: RRR no murmurs, gallops, rubs PULM: Normal respiratory effort, Clear to auscultation bilaterally without wheezing or crackles ABD: Benign EXTREMITIES: +edema, NO cyanosis or Homans'signs SKIN: Fair turgor. No rashes LYMPHATIC: No nodes NEURO: No gross deficits PSYCH: Appropriate affect, alert, oriented   1. Solid pulmonary nodules are stable or decreased in size compared  with most recent prior. Largest on current exam measures 4 mm and is  new when compared with January 22, 2020 prior. Additional follow-up  with non-contrast chest CT can be considered in 12 months (dated  from April 06, 2022 exam) if patient is high-risk. This  recommendation follows the consensus statement: Guidelines for  Management of Incidental Pulmonary Nodules Detected on CT Images:  From the Fleischner Society 2017; Radiology 2017; 284:228-243.  2. Aortic Atherosclerosis (ICD10-I70.0) and Emphysema (ICD10-J43.9).   Electronically Signed    By: Rea Marc M.D.    On: 07/08/2022 11:34    IMPRESSION:  1. Scattered small solid right pulmonary nodules, some new measuring   up to 0.6 cm in the right upper lobe, some slightly increased, some  stable and some decreased since 01/22/2020 chest CT. These nodules  are all below PET resolution. Recommend attention on follow-up chest  CT in 3 months.  2. Two-vessel coronary atherosclerosis.  3. Severe centrilobular emphysema with mild diffuse bronchial wall  thickening, compatible with the reported history of COPD.  4. Aortic Atherosclerosis (ICD10-I70.0) and Emphysema (ICD10-J43.9).   Electronically Signed    By: Selinda DELENA Blue M.D.    On: 04/06/2022 14:22     NORMAL LEFT VENTRICULAR SYSTOLIC FUNCTION WITH NO LVH ESTIMATED EF: >55% DIASTOLIC FUNCTION CAN'T BE DETERMINED NORMAL RIGHT VENTRICULAR SYSTOLIC FUNCTION VALVULAR REGURGITATION: TRIVIAL AR, TRIVIAL MR, TRIVIAL PR, TRIVIAL TR NO VALVULAR STENOSIS   Impression: Very severe copd, (fev1 0.6 liters) ex smoker ( quit 1991), on 2 liters portable oxygen . + chronic dyspnea on minimum acitivity.  Continue Trelegy and Albuterol , prednisone  to 5 mg q day, daliresp  500 mg  q day ( refilleds) Stay on Oxygen  at 3 liters at rest and 4 liters with activity  F/u in 12 weeks   Rhinitis/post nasal dripping, chronic Flonase , allegra 180 mg q day Nasal rinses   Tiny nodules, stable or decreased ( 4 mm), swallowing difficulty Repeat chest ct in 7/25 ( refused  I cannot lay done. )   Leg/ankle edema, ?  Norvasc related,  LVF ok Elevate legs Watch salt Lasix  20 mg  q day  Hold hctz.   Nose bleeds Hunification   Difficulty swallowing Send to speech for an eval

## 2023-10-15 ENCOUNTER — Other Ambulatory Visit: Payer: Self-pay

## 2023-10-15 ENCOUNTER — Inpatient Hospital Stay
Admission: EM | Admit: 2023-10-15 | Discharge: 2023-10-25 | DRG: 189 | Disposition: A | Discharge status: Skilled Nursing Facility | Attending: Internal Medicine | Admitting: Internal Medicine

## 2023-10-15 ENCOUNTER — Emergency Department

## 2023-10-15 DIAGNOSIS — R0602 Shortness of breath: Secondary | ICD-10-CM | POA: Diagnosis present

## 2023-10-15 DIAGNOSIS — F419 Anxiety disorder, unspecified: Secondary | ICD-10-CM | POA: Diagnosis present

## 2023-10-15 DIAGNOSIS — R1310 Dysphagia, unspecified: Secondary | ICD-10-CM | POA: Diagnosis present

## 2023-10-15 DIAGNOSIS — I2699 Other pulmonary embolism without acute cor pulmonale: Secondary | ICD-10-CM | POA: Diagnosis present

## 2023-10-15 DIAGNOSIS — Z79899 Other long term (current) drug therapy: Secondary | ICD-10-CM

## 2023-10-15 DIAGNOSIS — R0902 Hypoxemia: Secondary | ICD-10-CM | POA: Diagnosis not present

## 2023-10-15 DIAGNOSIS — Z87891 Personal history of nicotine dependence: Secondary | ICD-10-CM

## 2023-10-15 DIAGNOSIS — I110 Hypertensive heart disease with heart failure: Secondary | ICD-10-CM | POA: Diagnosis present

## 2023-10-15 DIAGNOSIS — I4892 Unspecified atrial flutter: Secondary | ICD-10-CM | POA: Diagnosis present

## 2023-10-15 DIAGNOSIS — I480 Paroxysmal atrial fibrillation: Secondary | ICD-10-CM | POA: Diagnosis present

## 2023-10-15 DIAGNOSIS — I509 Heart failure, unspecified: Secondary | ICD-10-CM | POA: Diagnosis not present

## 2023-10-15 DIAGNOSIS — J9600 Acute respiratory failure, unspecified whether with hypoxia or hypercapnia: Secondary | ICD-10-CM | POA: Diagnosis not present

## 2023-10-15 DIAGNOSIS — I5033 Acute on chronic diastolic (congestive) heart failure: Secondary | ICD-10-CM | POA: Diagnosis present

## 2023-10-15 DIAGNOSIS — Z1152 Encounter for screening for COVID-19: Secondary | ICD-10-CM | POA: Diagnosis not present

## 2023-10-15 DIAGNOSIS — J441 Chronic obstructive pulmonary disease with (acute) exacerbation: Secondary | ICD-10-CM | POA: Diagnosis present

## 2023-10-15 DIAGNOSIS — Z794 Long term (current) use of insulin: Secondary | ICD-10-CM

## 2023-10-15 DIAGNOSIS — E43 Unspecified severe protein-calorie malnutrition: Secondary | ICD-10-CM | POA: Diagnosis present

## 2023-10-15 DIAGNOSIS — N281 Cyst of kidney, acquired: Secondary | ICD-10-CM | POA: Diagnosis not present

## 2023-10-15 DIAGNOSIS — J9601 Acute respiratory failure with hypoxia: Secondary | ICD-10-CM | POA: Diagnosis not present

## 2023-10-15 DIAGNOSIS — J439 Emphysema, unspecified: Secondary | ICD-10-CM | POA: Diagnosis present

## 2023-10-15 DIAGNOSIS — Z7983 Long term (current) use of bisphosphonates: Secondary | ICD-10-CM | POA: Diagnosis not present

## 2023-10-15 DIAGNOSIS — E119 Type 2 diabetes mellitus without complications: Secondary | ICD-10-CM

## 2023-10-15 DIAGNOSIS — J9621 Acute and chronic respiratory failure with hypoxia: Secondary | ICD-10-CM | POA: Diagnosis present

## 2023-10-15 DIAGNOSIS — Z515 Encounter for palliative care: Secondary | ICD-10-CM | POA: Diagnosis not present

## 2023-10-15 DIAGNOSIS — E1165 Type 2 diabetes mellitus with hyperglycemia: Secondary | ICD-10-CM | POA: Diagnosis present

## 2023-10-15 DIAGNOSIS — I428 Other cardiomyopathies: Secondary | ICD-10-CM | POA: Diagnosis present

## 2023-10-15 DIAGNOSIS — Z9981 Dependence on supplemental oxygen: Secondary | ICD-10-CM

## 2023-10-15 DIAGNOSIS — Z7952 Long term (current) use of systemic steroids: Secondary | ICD-10-CM

## 2023-10-15 DIAGNOSIS — I4891 Unspecified atrial fibrillation: Secondary | ICD-10-CM | POA: Diagnosis not present

## 2023-10-15 DIAGNOSIS — I959 Hypotension, unspecified: Secondary | ICD-10-CM

## 2023-10-15 DIAGNOSIS — Z681 Body mass index (BMI) 19 or less, adult: Secondary | ICD-10-CM

## 2023-10-15 DIAGNOSIS — R627 Adult failure to thrive: Secondary | ICD-10-CM | POA: Diagnosis present

## 2023-10-15 DIAGNOSIS — E785 Hyperlipidemia, unspecified: Secondary | ICD-10-CM | POA: Diagnosis present

## 2023-10-15 DIAGNOSIS — G9349 Other encephalopathy: Secondary | ICD-10-CM | POA: Diagnosis present

## 2023-10-15 DIAGNOSIS — R000 Tachycardia, unspecified: Secondary | ICD-10-CM | POA: Insufficient documentation

## 2023-10-15 DIAGNOSIS — M810 Age-related osteoporosis without current pathological fracture: Secondary | ICD-10-CM | POA: Diagnosis present

## 2023-10-15 DIAGNOSIS — R069 Unspecified abnormalities of breathing: Secondary | ICD-10-CM | POA: Diagnosis not present

## 2023-10-15 DIAGNOSIS — I5020 Unspecified systolic (congestive) heart failure: Secondary | ICD-10-CM | POA: Diagnosis not present

## 2023-10-15 DIAGNOSIS — J9602 Acute respiratory failure with hypercapnia: Secondary | ICD-10-CM | POA: Diagnosis not present

## 2023-10-15 DIAGNOSIS — I10 Essential (primary) hypertension: Secondary | ICD-10-CM | POA: Insufficient documentation

## 2023-10-15 DIAGNOSIS — I5021 Acute systolic (congestive) heart failure: Secondary | ICD-10-CM

## 2023-10-15 DIAGNOSIS — Z7189 Other specified counseling: Secondary | ICD-10-CM | POA: Diagnosis not present

## 2023-10-15 DIAGNOSIS — T380X5A Adverse effect of glucocorticoids and synthetic analogues, initial encounter: Secondary | ICD-10-CM | POA: Diagnosis present

## 2023-10-15 DIAGNOSIS — J9622 Acute and chronic respiratory failure with hypercapnia: Secondary | ICD-10-CM | POA: Diagnosis present

## 2023-10-15 DIAGNOSIS — Z888 Allergy status to other drugs, medicaments and biological substances status: Secondary | ICD-10-CM

## 2023-10-15 DIAGNOSIS — R54 Age-related physical debility: Secondary | ICD-10-CM | POA: Diagnosis present

## 2023-10-15 DIAGNOSIS — R7401 Elevation of levels of liver transaminase levels: Secondary | ICD-10-CM | POA: Diagnosis present

## 2023-10-15 DIAGNOSIS — R319 Hematuria, unspecified: Secondary | ICD-10-CM | POA: Diagnosis not present

## 2023-10-15 DIAGNOSIS — R918 Other nonspecific abnormal finding of lung field: Secondary | ICD-10-CM | POA: Diagnosis not present

## 2023-10-15 DIAGNOSIS — I9589 Other hypotension: Secondary | ICD-10-CM | POA: Diagnosis not present

## 2023-10-15 LAB — CBC WITH DIFFERENTIAL/PLATELET
Abs Immature Granulocytes: 0.04 K/uL (ref 0.00–0.07)
Basophils Absolute: 0.1 K/uL (ref 0.0–0.1)
Basophils Relative: 1 %
Eosinophils Absolute: 0 K/uL (ref 0.0–0.5)
Eosinophils Relative: 1 %
HCT: 39.5 % (ref 36.0–46.0)
Hemoglobin: 12.2 g/dL (ref 12.0–15.0)
Immature Granulocytes: 1 %
Lymphocytes Relative: 15 %
Lymphs Abs: 1.2 K/uL (ref 0.7–4.0)
MCH: 29.3 pg (ref 26.0–34.0)
MCHC: 30.9 g/dL (ref 30.0–36.0)
MCV: 95 fL (ref 80.0–100.0)
Monocytes Absolute: 0.5 K/uL (ref 0.1–1.0)
Monocytes Relative: 7 %
Neutro Abs: 5.7 K/uL (ref 1.7–7.7)
Neutrophils Relative %: 75 %
Platelets: 234 K/uL (ref 150–400)
RBC: 4.16 MIL/uL (ref 3.87–5.11)
RDW: 14 % (ref 11.5–15.5)
WBC: 7.5 K/uL (ref 4.0–10.5)
nRBC: 0 % (ref 0.0–0.2)

## 2023-10-15 LAB — BLOOD GAS, VENOUS
Acid-Base Excess: 8.5 mmol/L — ABNORMAL HIGH (ref 0.0–2.0)
Bicarbonate: 40.4 mmol/L — ABNORMAL HIGH (ref 20.0–28.0)
Delivery systems: POSITIVE
FIO2: 40 %
O2 Saturation: 19.5 %
Patient temperature: 37
pCO2, Ven: 101 mmHg (ref 44–60)
pH, Ven: 7.21 — ABNORMAL LOW (ref 7.25–7.43)
pO2, Ven: <31 mmHg — CL (ref 32–45)

## 2023-10-15 LAB — COMPREHENSIVE METABOLIC PANEL WITH GFR
ALT: 145 U/L — ABNORMAL HIGH (ref 0–44)
AST: 132 U/L — ABNORMAL HIGH (ref 15–41)
Albumin: 3.5 g/dL (ref 3.5–5.0)
Alkaline Phosphatase: 58 U/L (ref 38–126)
Anion gap: 10 (ref 5–15)
BUN: 21 mg/dL (ref 8–23)
CO2: 33 mmol/L — ABNORMAL HIGH (ref 22–32)
Calcium: 8.3 mg/dL — ABNORMAL LOW (ref 8.9–10.3)
Chloride: 94 mmol/L — ABNORMAL LOW (ref 98–111)
Creatinine, Ser: 0.92 mg/dL (ref 0.44–1.00)
GFR, Estimated: >60 mL/min (ref >60)
Glucose, Bld: 198 mg/dL — ABNORMAL HIGH (ref 70–99)
Potassium: 3.9 mmol/L (ref 3.5–5.1)
Sodium: 137 mmol/L (ref 135–145)
Total Bilirubin: 0.6 mg/dL (ref 0.0–1.2)
Total Protein: 6.6 g/dL (ref 6.5–8.1)

## 2023-10-15 LAB — BRAIN NATRIURETIC PEPTIDE: B Natriuretic Peptide: 1150 pg/mL — ABNORMAL HIGH (ref 0.0–100.0)

## 2023-10-15 LAB — MAGNESIUM: Magnesium: 2.6 mg/dL — ABNORMAL HIGH (ref 1.7–2.4)

## 2023-10-15 LAB — TROPONIN I (HIGH SENSITIVITY)
Troponin I (High Sensitivity): 33 ng/L — ABNORMAL HIGH (ref <18)
Troponin I (High Sensitivity): 48 ng/L — ABNORMAL HIGH (ref <18)

## 2023-10-15 MED ORDER — FUROSEMIDE 10 MG/ML IJ SOLN
40.0000 mg | Freq: Two times a day (BID) | INTRAMUSCULAR | Status: DC
  Administered 2023-10-16 – 2023-10-18 (×7): 40 mg via INTRAVENOUS
  Filled 2023-10-15 (×7): qty 4

## 2023-10-15 MED ORDER — METHYLPREDNISOLONE SODIUM SUCC 125 MG IJ SOLR
125.0000 mg | Freq: Once | INTRAMUSCULAR | Status: AC
  Administered 2023-10-15: 125 mg via INTRAVENOUS
  Filled 2023-10-15: qty 2

## 2023-10-15 MED ORDER — INSULIN ASPART 100 UNIT/ML IJ SOLN: 0.0000 [IU] | INTRAMUSCULAR | Status: DC

## 2023-10-15 MED ORDER — MIDAZOLAM HCL 2 MG/2ML IJ SOLN
2.0000 mg | Freq: Once | INTRAMUSCULAR | Status: AC
  Administered 2023-10-15: 2 mg via INTRAVENOUS
  Filled 2023-10-15: qty 2

## 2023-10-15 MED ORDER — IPRATROPIUM-ALBUTEROL 0.5-2.5 (3) MG/3ML IN SOLN
6.0000 mL | Freq: Once | RESPIRATORY_TRACT | Status: AC
  Administered 2023-10-15: 6 mL via RESPIRATORY_TRACT
  Filled 2023-10-15: qty 6

## 2023-10-15 NOTE — ED Triage Notes (Signed)
 Per EMS  SOB Started last night Worsening today 3L [place] baseline Presented with non-rebreather

## 2023-10-15 NOTE — ED Provider Notes (Signed)
 [facility] Provider Note    Event Date/Time   First MD Initiated Contact with Patient 10/15/23 2055     (approximate)   History   Shortness of Breath   HPI  Monica Stewart is a 78 y.o. female who presents to the ED for evaluation of Shortness of Breath   I review a pulmonary clinic visit from 3 weeks ago.  History of very severe COPD on chronic oxygen  and low-dose prednisone .  Patient presents to the ED alongside her 2 daughters for evaluation of worsening shortness of breath over the past 1-2 days.  EMS tried CPAP but she did not tolerate this, quickly ripping it off of her face  Physical Exam   Triage Vital Signs: ED Triage Vitals  Encounter Vitals Group     BP      Girls Systolic BP Percentile      Girls Diastolic BP Percentile      Boys Systolic BP Percentile      Boys Diastolic BP Percentile      Pulse      Resp      Temp      Temp src      SpO2      Weight      Height      Head Circumference      Peak Flow      Pain Score      Pain Loc      Pain Education      Exclude from Growth Chart     Most recent vital signs: Vitals:   10/15/23 2200 10/15/23 2230  BP: 105/80 108/75  Pulse:  (!) 26  Resp: (!) 31 (!) 28  Temp:    SpO2:  (!) 57%    General: Awake, no distress.  CV:  Good peripheral perfusion.  Resp:  Tachypneic to the low 30s.  No tripoding but looks quite uncomfortable.  Silent breath sounds on auscultation, cannot appreciate any significant airflow Abd:  No distention.  MSK:  No deformity noted.  Lower extremity edema is present bilaterally without overlying skin changes Neuro:  No focal deficits appreciated. Other:     ED Results / Procedures / Treatments   Labs (all labs ordered are listed, but only abnormal results are displayed) Labs Reviewed  BRAIN NATRIURETIC PEPTIDE - Abnormal; Notable for the following components:      Result Value   B Natriuretic Peptide 1,150.0 (*)    All other components within  normal limits  COMPREHENSIVE METABOLIC PANEL WITH GFR - Abnormal; Notable for the following components:   Chloride 94 (*)    CO2 33 (*)    Glucose, Bld 198 (*)    Calcium  8.3 (*)    AST 132 (*)    ALT 145 (*)    All other components within normal limits  MAGNESIUM  - Abnormal; Notable for the following components:   Magnesium  2.6 (*)    All other components within normal limits  TROPONIN I (HIGH SENSITIVITY) - Abnormal; Notable for the following components:   Troponin I (High Sensitivity) 33 (*)    All other components within normal limits  CBC WITH DIFFERENTIAL/PLATELET  TROPONIN I (HIGH SENSITIVITY)    EKG Sinus tachycardia with rate of 111 bpm.  Normal axis, partial bundle, no STEMI, nonspecific changes with T wave inversions inferiorly/laterally.  RADIOLOGY CXR interpreted by me without evidence of acute cardiopulmonary pathology.  Official radiology report(s): DG Chest Portable 1 View Result Date: 10/15/2023 CLINICAL  DATA:  COPD exacerbation EXAM: PORTABLE CHEST - 1 VIEW COMPARISON:  02/23/2020 FINDINGS: Pulmonary hyperinflation with attenuated bronchovascular markings in both upper lobes. No focal airspace disease. Mild interstitial prominence in the lung bases, increased from previous. Heart size and mediastinal contours are within normal limits. Aortic Atherosclerosis (ICD10-170.0). Mild blunting of the lateral costophrenic angles. Visualized bones unremarkable. IMPRESSION: 1. Pulmonary hyperinflation with mild bibasilar interstitial prominence. 2. Possible small pleural effusions. Electronically Signed   By: JONETTA Faes M.D.   On: 10/15/2023 21:15    PROCEDURES and INTERVENTIONS:  .1-3 Lead EKG Interpretation  Performed by: Claudene Rover, MD Authorized by: Claudene Rover, MD     Interpretation: abnormal     ECG rate:  110   ECG rate assessment: tachycardic     Rhythm: sinus tachycardia     Ectopy: none     Conduction: normal   .Critical Care  Performed by: Claudene Rover, MD Authorized by: Claudene Rover, MD   Critical care provider statement:    Critical care time (minutes):  30   Critical care time was exclusive of:  Separately billable procedures and treating other patients   Critical care was necessary to treat or prevent imminent or life-threatening deterioration of the following conditions:  Respiratory failure   Critical care was time spent personally by me on the following activities:  Development of treatment plan with patient or surrogate, discussions with consultants, evaluation of patient's response to treatment, examination of patient, ordering and review of laboratory studies, ordering and review of radiographic studies, ordering and performing treatments and interventions, pulse oximetry, re-evaluation of patient's condition and review of old charts   Medications  midazolam  (VERSED ) injection 2 mg (2 mg Intravenous Given 10/15/23 2107)  ipratropium-albuterol  (DUONEB) 0.5-2.5 (3) MG/3ML nebulizer solution 6 mL (6 mLs Nebulization Given 10/15/23 2110)  methylPREDNISolone  sodium succinate (SOLU-MEDROL ) 125 mg/2 mL injection 125 mg (125 mg Intravenous Given 10/15/23 2110)     IMPRESSION / MDM / ASSESSMENT AND PLAN / ED COURSE  I reviewed the triage vital signs and the nursing notes.  Differential diagnosis includes, but is not limited to, ACS, PTX, PNA, muscle strain/spasm, PE, dissection, anxiety, pleural effusion, COPD exacerbation  {Patient presents with symptoms of an acute illness or injury that is potentially life-threatening.  Patient presents with a COPD exacerbation requiring BiPAP and medical admission.  Looks unwell on arrival but hemodynamically stable without signs of shock.  Silent breath sounds.  Started on BiPAP facilitated by Versed  which is well-tolerated.  CXR without infiltrate or pneumothorax.  Mildly elevated troponin and we will trend this, nonischemic EKG with nonspecific changes.  Normal CBC.  Pending metabolic panel and  BNP, will consult with medicine for admission  Clinical Course as of 10/15/23 2314  Sat Oct 15, 2023  2115 Reassessed, trying some Versed  to help facilitate tolerance of the BiPAP.  Seems to be going okay. [DS]  2148 Reassessed.  Tolerating BiPAP well.  Maintaining good volumes, 350 cc [DS]    Clinical Course User Index [DS] Claudene Rover, MD     FINAL CLINICAL IMPRESSION(S) / ED DIAGNOSES   Final diagnoses:  COPD exacerbation (HCC)  SOB (shortness of breath)  Hypoxia     Rx / DC Orders   ED Discharge Orders     None        Note:  This document was prepared using Dragon voice recognition software and may include unintentional dictation errors.   Claudene Rover, MD 10/15/23 [phone]

## 2023-10-16 ENCOUNTER — Inpatient Hospital Stay (HOSPITAL_COMMUNITY)
Admit: 2023-10-16 | Discharge: 2023-10-16 | Disposition: A | Discharge status: Home / Self Care | Attending: Internal Medicine | Admitting: Internal Medicine

## 2023-10-16 DIAGNOSIS — R000 Tachycardia, unspecified: Secondary | ICD-10-CM | POA: Insufficient documentation

## 2023-10-16 DIAGNOSIS — R0602 Shortness of breath: Secondary | ICD-10-CM | POA: Diagnosis present

## 2023-10-16 DIAGNOSIS — E785 Hyperlipidemia, unspecified: Secondary | ICD-10-CM | POA: Diagnosis present

## 2023-10-16 DIAGNOSIS — J9621 Acute and chronic respiratory failure with hypoxia: Secondary | ICD-10-CM | POA: Diagnosis present

## 2023-10-16 DIAGNOSIS — J441 Chronic obstructive pulmonary disease with (acute) exacerbation: Secondary | ICD-10-CM | POA: Diagnosis present

## 2023-10-16 DIAGNOSIS — I9589 Other hypotension: Secondary | ICD-10-CM | POA: Diagnosis not present

## 2023-10-16 DIAGNOSIS — Z794 Long term (current) use of insulin: Secondary | ICD-10-CM | POA: Diagnosis not present

## 2023-10-16 DIAGNOSIS — Z515 Encounter for palliative care: Secondary | ICD-10-CM | POA: Diagnosis not present

## 2023-10-16 DIAGNOSIS — Z681 Body mass index (BMI) 19 or less, adult: Secondary | ICD-10-CM | POA: Diagnosis not present

## 2023-10-16 DIAGNOSIS — Z7983 Long term (current) use of bisphosphonates: Secondary | ICD-10-CM | POA: Diagnosis not present

## 2023-10-16 DIAGNOSIS — F419 Anxiety disorder, unspecified: Secondary | ICD-10-CM | POA: Diagnosis present

## 2023-10-16 DIAGNOSIS — J9601 Acute respiratory failure with hypoxia: Secondary | ICD-10-CM | POA: Diagnosis not present

## 2023-10-16 DIAGNOSIS — I480 Paroxysmal atrial fibrillation: Secondary | ICD-10-CM | POA: Diagnosis present

## 2023-10-16 DIAGNOSIS — I5033 Acute on chronic diastolic (congestive) heart failure: Secondary | ICD-10-CM | POA: Diagnosis present

## 2023-10-16 DIAGNOSIS — I2699 Other pulmonary embolism without acute cor pulmonale: Secondary | ICD-10-CM

## 2023-10-16 DIAGNOSIS — R7401 Elevation of levels of liver transaminase levels: Secondary | ICD-10-CM | POA: Insufficient documentation

## 2023-10-16 DIAGNOSIS — J9622 Acute and chronic respiratory failure with hypercapnia: Secondary | ICD-10-CM | POA: Diagnosis present

## 2023-10-16 DIAGNOSIS — E1165 Type 2 diabetes mellitus with hyperglycemia: Secondary | ICD-10-CM | POA: Diagnosis present

## 2023-10-16 DIAGNOSIS — T380X5A Adverse effect of glucocorticoids and synthetic analogues, initial encounter: Secondary | ICD-10-CM | POA: Diagnosis present

## 2023-10-16 DIAGNOSIS — J9602 Acute respiratory failure with hypercapnia: Secondary | ICD-10-CM | POA: Diagnosis not present

## 2023-10-16 DIAGNOSIS — I10 Essential (primary) hypertension: Secondary | ICD-10-CM | POA: Insufficient documentation

## 2023-10-16 DIAGNOSIS — I428 Other cardiomyopathies: Secondary | ICD-10-CM | POA: Diagnosis present

## 2023-10-16 DIAGNOSIS — I110 Hypertensive heart disease with heart failure: Secondary | ICD-10-CM | POA: Diagnosis present

## 2023-10-16 DIAGNOSIS — Z7189 Other specified counseling: Secondary | ICD-10-CM | POA: Diagnosis not present

## 2023-10-16 DIAGNOSIS — Z79899 Other long term (current) drug therapy: Secondary | ICD-10-CM | POA: Diagnosis not present

## 2023-10-16 DIAGNOSIS — J439 Emphysema, unspecified: Secondary | ICD-10-CM | POA: Diagnosis present

## 2023-10-16 DIAGNOSIS — I5021 Acute systolic (congestive) heart failure: Secondary | ICD-10-CM | POA: Diagnosis not present

## 2023-10-16 DIAGNOSIS — R1310 Dysphagia, unspecified: Secondary | ICD-10-CM | POA: Diagnosis present

## 2023-10-16 DIAGNOSIS — M810 Age-related osteoporosis without current pathological fracture: Secondary | ICD-10-CM | POA: Insufficient documentation

## 2023-10-16 DIAGNOSIS — E43 Unspecified severe protein-calorie malnutrition: Secondary | ICD-10-CM | POA: Diagnosis present

## 2023-10-16 DIAGNOSIS — G9349 Other encephalopathy: Secondary | ICD-10-CM | POA: Diagnosis present

## 2023-10-16 DIAGNOSIS — E119 Type 2 diabetes mellitus without complications: Secondary | ICD-10-CM

## 2023-10-16 DIAGNOSIS — I4892 Unspecified atrial flutter: Secondary | ICD-10-CM | POA: Diagnosis present

## 2023-10-16 DIAGNOSIS — R627 Adult failure to thrive: Secondary | ICD-10-CM | POA: Diagnosis present

## 2023-10-16 DIAGNOSIS — Z1152 Encounter for screening for COVID-19: Secondary | ICD-10-CM | POA: Diagnosis not present

## 2023-10-16 DIAGNOSIS — J9600 Acute respiratory failure, unspecified whether with hypoxia or hypercapnia: Secondary | ICD-10-CM | POA: Diagnosis present

## 2023-10-16 LAB — BLOOD GAS, VENOUS
Acid-Base Excess: 11 mmol/L — ABNORMAL HIGH (ref 0.0–2.0)
Bicarbonate: 42.2 mmol/L — ABNORMAL HIGH (ref 20.0–28.0)
Delivery systems: POSITIVE
FIO2: 40 %
O2 Saturation: 38.5 %
Patient temperature: 37
pCO2, Ven: 94 mmHg (ref 44–60)
pH, Ven: 7.26 (ref 7.25–7.43)
pO2, Ven: <31 mmHg — CL (ref 32–45)

## 2023-10-16 LAB — CBC
HCT: 36.6 % (ref 36.0–46.0)
Hemoglobin: 11.7 g/dL — ABNORMAL LOW (ref 12.0–15.0)
MCH: 29.5 pg (ref 26.0–34.0)
MCHC: 32 g/dL (ref 30.0–36.0)
MCV: 92.2 fL (ref 80.0–100.0)
Platelets: 206 K/uL (ref 150–400)
RBC: 3.97 MIL/uL (ref 3.87–5.11)
RDW: 13.9 % (ref 11.5–15.5)
WBC: 9.3 K/uL (ref 4.0–10.5)
nRBC: 0 % (ref 0.0–0.2)

## 2023-10-16 LAB — GLUCOSE, CAPILLARY
Glucose-Capillary: 112 mg/dL — ABNORMAL HIGH (ref 70–99)
Glucose-Capillary: 115 mg/dL — ABNORMAL HIGH (ref 70–99)
Glucose-Capillary: 126 mg/dL — ABNORMAL HIGH (ref 70–99)
Glucose-Capillary: 127 mg/dL — ABNORMAL HIGH (ref 70–99)
Glucose-Capillary: 132 mg/dL — ABNORMAL HIGH (ref 70–99)
Glucose-Capillary: 142 mg/dL — ABNORMAL HIGH (ref 70–99)
Glucose-Capillary: 144 mg/dL — ABNORMAL HIGH (ref 70–99)

## 2023-10-16 LAB — ECHOCARDIOGRAM COMPLETE
AR max vel: 2.23 cm2
AV Peak grad: 5.8 mmHg
Ao pk vel: 1.2 m/s
Area-P 1/2: 6.37 cm2
Calc EF: 38.7 %
Height: 64 in
S' Lateral: 3.6 cm
Single Plane A2C EF: 38.2 %
Single Plane A4C EF: 40.9 %
Weight: 1795.43 [oz_av]

## 2023-10-16 LAB — HIV ANTIBODY (ROUTINE TESTING W REFLEX): HIV Screen 4th Generation wRfx: NONREACTIVE

## 2023-10-16 LAB — HEPATITIS PANEL, ACUTE
HCV Ab: NONREACTIVE
Hep A IgM: NONREACTIVE
Hep B C IgM: NONREACTIVE
Hepatitis B Surface Ag: NONREACTIVE

## 2023-10-16 LAB — COMPREHENSIVE METABOLIC PANEL WITH GFR
ALT: 156 U/L — ABNORMAL HIGH (ref 0–44)
AST: 148 U/L — ABNORMAL HIGH (ref 15–41)
Albumin: 3.5 g/dL (ref 3.5–5.0)
Alkaline Phosphatase: 56 U/L (ref 38–126)
Anion gap: 12 (ref 5–15)
BUN: 22 mg/dL (ref 8–23)
CO2: 35 mmol/L — ABNORMAL HIGH (ref 22–32)
Calcium: 9.6 mg/dL (ref 8.9–10.3)
Chloride: 90 mmol/L — ABNORMAL LOW (ref 98–111)
Creatinine, Ser: 0.83 mg/dL (ref 0.44–1.00)
GFR, Estimated: >60 mL/min (ref >60)
Glucose, Bld: 140 mg/dL — ABNORMAL HIGH (ref 70–99)
Potassium: 4.4 mmol/L (ref 3.5–5.1)
Sodium: 137 mmol/L (ref 135–145)
Total Bilirubin: 0.5 mg/dL (ref 0.0–1.2)
Total Protein: 7 g/dL (ref 6.5–8.1)

## 2023-10-16 LAB — PROCALCITONIN: Procalcitonin: 0.14 ng/mL

## 2023-10-16 LAB — TSH: TSH: 0.627 u[IU]/mL (ref 0.350–4.500)

## 2023-10-16 LAB — GAMMA GT: GGT: 33 U/L (ref 7–50)

## 2023-10-16 LAB — RESP PANEL BY RT-PCR (RSV, FLU A&B, COVID)  RVPGX2
Influenza A by PCR: NEGATIVE
Influenza B by PCR: NEGATIVE
Resp Syncytial Virus by PCR: NEGATIVE
SARS Coronavirus 2 by RT PCR: NEGATIVE

## 2023-10-16 LAB — D-DIMER, QUANTITATIVE
D-Dimer, Quant: 1.18 ug{FEU}/mL — ABNORMAL HIGH (ref 0.00–0.50)
D-Dimer, Quant: 1.44 ug{FEU}/mL — ABNORMAL HIGH (ref 0.00–0.50)

## 2023-10-16 LAB — HEMOGLOBIN A1C
Hgb A1c MFr Bld: 6.7 % — ABNORMAL HIGH (ref 4.8–5.6)
Mean Plasma Glucose: 145.59 mg/dL

## 2023-10-16 LAB — C-REACTIVE PROTEIN: CRP: 1.9 mg/dL — ABNORMAL HIGH (ref <1.0)

## 2023-10-16 MED ORDER — POLYETHYLENE GLYCOL 3350 17 G PO PACK: 17.0000 g | PACK | Freq: Every day | ORAL | Status: DC | PRN

## 2023-10-16 MED ORDER — DIAZEPAM 5 MG/ML IJ SOLN
2.5000 mg | Freq: Two times a day (BID) | INTRAMUSCULAR | Status: DC | PRN
  Filled 2023-10-16: qty 2

## 2023-10-16 MED ORDER — DIAZEPAM 5 MG/ML IJ SOLN: 0.5000 mg | Freq: Three times a day (TID) | INTRAMUSCULAR | Status: DC | PRN

## 2023-10-16 MED ORDER — ONDANSETRON HCL 4 MG/2ML IJ SOLN: 4.0000 mg | Freq: Four times a day (QID) | INTRAMUSCULAR | Status: DC | PRN

## 2023-10-16 MED ORDER — IPRATROPIUM-ALBUTEROL 0.5-2.5 (3) MG/3ML IN SOLN
3.0000 mL | RESPIRATORY_TRACT | Status: DC
  Administered 2023-10-16 (×3): 3 mL via RESPIRATORY_TRACT
  Filled 2023-10-16 (×3): qty 3

## 2023-10-16 MED ORDER — ALPRAZOLAM 0.25 MG PO TABS: 0.2500 mg | ORAL_TABLET | Freq: Two times a day (BID) | ORAL | Status: DC | PRN

## 2023-10-16 MED ORDER — ACETAMINOPHEN 325 MG PO TABS
650.0000 mg | ORAL_TABLET | Freq: Four times a day (QID) | ORAL | Status: DC | PRN
Start: 2023-10-16 — End: 2023-10-25

## 2023-10-16 MED ORDER — INSULIN ASPART 100 UNIT/ML IJ SOLN
0.0000 [IU] | Freq: Every day | INTRAMUSCULAR | Status: DC
  Filled 2023-10-16: qty 1

## 2023-10-16 MED ORDER — PREDNISONE 20 MG PO TABS: 40.0000 mg | ORAL_TABLET | Freq: Every day | ORAL | Status: DC

## 2023-10-16 MED ORDER — LEVALBUTEROL HCL 0.63 MG/3ML IN NEBU
0.6300 mg | INHALATION_SOLUTION | Freq: Four times a day (QID) | RESPIRATORY_TRACT | Status: DC
  Administered 2023-10-16 – 2023-10-23 (×26): 0.63 mg via RESPIRATORY_TRACT
  Filled 2023-10-16 (×27): qty 3

## 2023-10-16 MED ORDER — DIAZEPAM 5 MG/ML IJ SOLN
2.5000 mg | Freq: Four times a day (QID) | INTRAMUSCULAR | Status: DC | PRN
  Administered 2023-10-16 – 2023-10-18 (×6): 2.5 mg via INTRAVENOUS
  Filled 2023-10-16 (×6): qty 2

## 2023-10-16 MED ORDER — INSULIN ASPART 100 UNIT/ML IJ SOLN
0.0000 [IU] | Freq: Three times a day (TID) | INTRAMUSCULAR | Status: DC
  Administered 2023-10-16 (×2): 1 [IU] via SUBCUTANEOUS
  Administered 2023-10-17 (×2): 2 [IU] via SUBCUTANEOUS
  Administered 2023-10-17: 1 [IU] via SUBCUTANEOUS
  Administered 2023-10-18 (×2): 2 [IU] via SUBCUTANEOUS
  Administered 2023-10-19: 1 [IU] via SUBCUTANEOUS
  Administered 2023-10-19: 2 [IU] via SUBCUTANEOUS
  Administered 2023-10-20: 1 [IU] via SUBCUTANEOUS
  Administered 2023-10-20: 3 [IU] via SUBCUTANEOUS
  Administered 2023-10-20: 2 [IU] via SUBCUTANEOUS
  Administered 2023-10-21: 1 [IU] via SUBCUTANEOUS
  Administered 2023-10-21: 5 [IU] via SUBCUTANEOUS
  Administered 2023-10-22 (×2): 1 [IU] via SUBCUTANEOUS
  Administered 2023-10-22: 3 [IU] via SUBCUTANEOUS
  Administered 2023-10-23: 2 [IU] via SUBCUTANEOUS
  Administered 2023-10-23: 3 [IU] via SUBCUTANEOUS
  Administered 2023-10-24: 5 [IU] via SUBCUTANEOUS
  Filled 2023-10-16 (×19): qty 1

## 2023-10-16 MED ORDER — PRAVASTATIN SODIUM 20 MG PO TABS: 40.0000 mg | ORAL_TABLET | Freq: Every day | ORAL | Status: DC

## 2023-10-16 MED ORDER — OYSTER SHELL CALCIUM/D3 500-5 MG-MCG PO TABS
1.0000 | ORAL_TABLET | Freq: Every day | ORAL | Status: DC
  Administered 2023-10-17 – 2023-10-25 (×9): 1 via ORAL
  Filled 2023-10-16 (×10): qty 1

## 2023-10-16 MED ORDER — PREDNISONE 20 MG PO TABS
40.0000 mg | ORAL_TABLET | Freq: Every day | ORAL | Status: DC
  Administered 2023-10-17 – 2023-10-19 (×3): 40 mg via ORAL
  Filled 2023-10-16 (×3): qty 2

## 2023-10-16 MED ORDER — ADULT MULTIVITAMIN W/MINERALS CH
1.0000 | ORAL_TABLET | Freq: Every day | ORAL | Status: DC
  Administered 2023-10-17 – 2023-10-24 (×8): 1 via ORAL
  Filled 2023-10-16 (×9): qty 1

## 2023-10-16 MED ORDER — IPRATROPIUM-ALBUTEROL 0.5-2.5 (3) MG/3ML IN SOLN
3.0000 mL | Freq: Four times a day (QID) | RESPIRATORY_TRACT | Status: DC
  Filled 2023-10-16 (×2): qty 3

## 2023-10-16 MED ORDER — ALBUTEROL SULFATE (2.5 MG/3ML) 0.083% IN NEBU
2.5000 mg | INHALATION_SOLUTION | RESPIRATORY_TRACT | Status: DC | PRN
Start: 2023-10-16 — End: 2023-10-23
  Administered 2023-10-16: 2.5 mg via RESPIRATORY_TRACT
  Filled 2023-10-16: qty 3

## 2023-10-16 MED ORDER — ONDANSETRON HCL 4 MG PO TABS: 4.0000 mg | ORAL_TABLET | Freq: Four times a day (QID) | ORAL | Status: DC | PRN

## 2023-10-16 MED ORDER — MAGNESIUM SULFATE 2 GM/50ML IV SOLN
2.0000 g | Freq: Once | INTRAVENOUS | Status: AC
  Administered 2023-10-16: 2 g via INTRAVENOUS
  Filled 2023-10-16: qty 50

## 2023-10-16 MED ORDER — ENOXAPARIN (LOVENOX) PATIENT EDUCATION KIT
PACK | Freq: Once | Status: AC
  Filled 2023-10-16: qty 1

## 2023-10-16 MED ORDER — SODIUM CHLORIDE 0.9 % IV SOLN
1.0000 g | INTRAVENOUS | Status: DC
  Administered 2023-10-16: 1 g via INTRAVENOUS
  Filled 2023-10-16: qty 10

## 2023-10-16 MED ORDER — LOSARTAN POTASSIUM 50 MG PO TABS
100.0000 mg | ORAL_TABLET | Freq: Every day | ORAL | Status: DC
  Administered 2023-10-17 – 2023-10-19 (×3): 100 mg via ORAL
  Filled 2023-10-16 (×4): qty 2

## 2023-10-16 MED ORDER — ENOXAPARIN SODIUM 60 MG/0.6ML IJ SOSY
1.0000 mg/kg | PREFILLED_SYRINGE | Freq: Two times a day (BID) | INTRAMUSCULAR | Status: DC
  Administered 2023-10-16 (×3): 55 mg via SUBCUTANEOUS
  Filled 2023-10-16 (×4): qty 0.6

## 2023-10-16 MED ORDER — UMECLIDINIUM-VILANTEROL 62.5-25 MCG/ACT IN AEPB
1.0000 | INHALATION_SPRAY | Freq: Every day | RESPIRATORY_TRACT | Status: DC
  Administered 2023-10-16 – 2023-10-25 (×10): 1 via RESPIRATORY_TRACT
  Filled 2023-10-16: qty 14

## 2023-10-16 MED ORDER — METHYLPREDNISOLONE SODIUM SUCC 40 MG IJ SOLR
40.0000 mg | Freq: Two times a day (BID) | INTRAMUSCULAR | Status: AC
  Administered 2023-10-16 (×2): 40 mg via INTRAVENOUS
  Filled 2023-10-16 (×2): qty 1

## 2023-10-16 MED ORDER — CHLORHEXIDINE GLUCONATE CLOTH 2 % EX PADS
6.0000 | MEDICATED_PAD | Freq: Every day | CUTANEOUS | Status: DC
  Administered 2023-10-16 – 2023-10-19 (×4): 6 via TOPICAL

## 2023-10-16 MED ORDER — ENOXAPARIN SODIUM 40 MG/0.4ML IJ SOSY: 40.0000 mg | PREFILLED_SYRINGE | INTRAMUSCULAR | Status: DC

## 2023-10-16 MED ORDER — MIDAZOLAM HCL 2 MG/2ML IJ SOLN: 0.5000 mg | Freq: Four times a day (QID) | INTRAMUSCULAR | Status: DC | PRN

## 2023-10-16 MED ORDER — METHYLPREDNISOLONE SODIUM SUCC 125 MG IJ SOLR: 125.0000 mg | Freq: Two times a day (BID) | INTRAMUSCULAR | Status: DC

## 2023-10-16 MED ORDER — ACETAMINOPHEN 650 MG RE SUPP: 650.0000 mg | Freq: Four times a day (QID) | RECTAL | Status: DC | PRN

## 2023-10-16 MED ORDER — FLUTICASONE PROPIONATE 50 MCG/ACT NA SUSP
2.0000 | Freq: Every day | NASAL | Status: DC
  Administered 2023-10-17 – 2023-10-23 (×5): 2 via NASAL
  Filled 2023-10-16: qty 16

## 2023-10-16 NOTE — Assessment & Plan Note (Deleted)
 Concern for pulmonary embolism Patient has elevated troponin and brain atretic peptide as well as D-dimer elevation even when age-adjusted, at baseline family member reports patient cannot lay for CT which was part of her pulmonary nodule workup we will anticoagulate empirically, pharmacy consult placed for therapeutic enoxaparin  and obtain CTPA once the patient's clinical status improves and can lay flat, follow echocardiogram for any evidence of right heart strain

## 2023-10-16 NOTE — Progress Notes (Signed)
 Pt as well as family at bedside notified this RN of desire for pt to be Full Code at this time. The pt stated that the reasoning was that her grandson is coming to visit tomorrow and she wants to be alive to see him. Pt and family educated on the meaning of changing code status from DNR to Full Code and family/pt understands voicing that in the event something happens tonight she wants to be brought back because her grandson is coming and she wants to see him before anything happens. This RN notified Dr. Lawence of the family request and code status was changed to Full Code. The family/pt states that they will speak with the doctors after the pt has visited with her grandson to discuss further changes to her code status.

## 2023-10-16 NOTE — Progress Notes (Signed)
 Spoke with Dter Annabella, pt's aunt, sister and pastor and confirmed DNR/DNI with thm. Pt d/w Palliative care earlier and wishes to be DNR/DNI as well. Will give IV valium  for anxiety

## 2023-10-16 NOTE — Progress Notes (Signed)
 PT Cancellation Note  Patient Details Name: Monica Stewart MRN: [id number] DOB: [birth year]   Cancelled Treatment:    Reason Eval/Treat Not Completed: Patient not medically ready;Medical issues which prohibited therapy  Therapy evaluation held over possible PE precautions; Per chart review, MD concerned for possible PE with elevated D-dimer and elevated troponins; Patient unable to tolerate CT testing; Anti-coag (lovenox ) started at 1:30AM on 10/16/23; Will hold for 24 hours as precaution and then resume when patient is medically stable.  Zavia Pullen PT, DPT 10/16/2023, 2:23 PM

## 2023-10-16 NOTE — Progress Notes (Signed)
 Pt has had little to no PO intake this shift d/t becoming SOB and anxious whenever taking off Bipap. Very little urine output--unmeasureable occurrences.  This RN bladder scanned patient. Bladder scan showing >460cc in bladder. No urge to void. Verbal order for foley catheter by MD.  This RN and Charge RN attempted to position patient for foley catheter insertion. Pt was unable to tolerate lying flat, as she stated it was more difficult to breathe. Education provided. Pt voided spontaneously x 1 during positioning. PVR showed approximately 360cc in bladder. Abdomen soft. Next dose of PRN Valium  not available until 2030. MD notified.  Directed to hold off on foley for now. Patient and family made aware. Verbal order for qshift bladder scans. This RN to communicate to oncoming shift.

## 2023-10-16 NOTE — Assessment & Plan Note (Deleted)
  Hypertension Holding amlodipine given the concern for dependent edema continue losartan  100 mg daily

## 2023-10-16 NOTE — Assessment & Plan Note (Deleted)
 Acute on chronic hypoxic and hypercarbic respiratory failure Chest x-ray notable only for pulmonary hyperinflation with mild bibasilar interstitial prominences with possible small pleural effusions Suspect primarily COPD ex exacerbation but potentially also acute heart failure contribution given grossly elevated brain atretic peptide and dependent edema also potential pulmonary embolism Patient on 3 L home oxygen , using accessory muscles and desaturating less than 89% on nonrebreather on initial presentation Now pulling tidal volumes of greater than 350 mL off of BiPAP and comfortable alert and oriented speaking full sentences, initial gas venous is pH 7.21 pCO2 101 bicarb 40.4 Plan: Repeat blood gas ordered for 1 hour's time at 1 AM, continue BiPAP pending interval repeat evaluation, continuous pulse oximetry, addressing COPD exacerbation and acute heart failure as per below, empiric anticoagulation

## 2023-10-16 NOTE — Progress Notes (Signed)
 [facility]  - [place] [facility]   PATIENT NAME: Monica Stewart    MR#:  [id number]  DATE OF BIRTH:  Oct 27, 1945  SUBJECTIVE:  daughter at bedside. Earlier met granddaughter as well. Patient came in with increasing shortness of breath with use of accessory muscles. She is currently on BiPAP. Removed earlier to give her a break starts drop-down in the 70s and patient had significant anxiety. Complaints of difficulty swallowing greatly per family. Daughter reports patient usually doubles up at home and is struggling to breathe even doing her ADLs. She normally wears 3 L nasal cannula oxygen  at home. Poor PO intake lately.    VITALS:  Blood pressure 137/88, pulse (!) 114, temperature 97.7 F (36.5 C), temperature source Axillary, resp. rate 20, height 5' 4 (1.626 m), weight 50.9 kg, SpO2 100%.  PHYSICAL EXAMINATION:   GENERAL:  78 y.o.-year-old patient with moderate acute distress. Use of accessory muscles LUNGS: distant breath sounds bilaterally, no wheezing on BiPAP CARDIOVASCULAR: S1, S2 normal. No murmur tachycardia ABDOMEN: Soft, nontender, nondistended.  EXTREMITIES: No  edema b/l.    NEUROLOGIC: nonfocal  patient is alert and awake, anxious   LABORATORY PANEL:  CBC Recent Labs  Lab 10/16/23 0149  WBC 9.3  HGB 11.7*  HCT 36.6  PLT 206    Chemistries  Recent Labs  Lab 10/15/23 2239 10/16/23 0149  NA 137 137  K 3.9 4.4  CL 94* 90*  CO2 33* 35*  GLUCOSE 198* 140*  BUN 21 22  CREATININE 0.92 0.83  CALCIUM  8.3* 9.6  MG 2.6*  --   AST 132* 148*  ALT 145* 156*  ALKPHOS 58 56  BILITOT 0.6 0.5   Cardiac Enzymes No results for input(s): TROPONINI in the last 168 hours. RADIOLOGY:  ECHOCARDIOGRAM COMPLETE Result Date: 10/16/2023    ECHOCARDIOGRAM REPORT   Patient Name:   Monica Stewart Date of Exam: 10/16/2023 Medical Rec #:  [id number]       Height:       64.0 in Accession #:    [id number]      Weight:       112.2 lb Date of Birth:  1945/08/27        BSA:          1.530 m Patient Age:    78 years        BP:           137/88 mmHg Patient Gender: F               HR:           115 bpm. Exam Location:  [facility] Procedure: 2D Echo, 3D Echo, Cardiac Doppler, Color Doppler and Strain Analysis            (Both Spectral and Color Flow Doppler were utilized during            procedure). Indications:     Elevated Troponin  History:         Patient has prior history of Echocardiogram examinations, most                  recent 05/03/2023.  Sonographer:     Thedora Louder RDCS, FASE Referring Phys:  [id number] PRENTICE BROCKS CORE Diagnosing Phys: Cara JONETTA Lovelace MD  Sonographer Comments: Global longitudinal strain was attempted. IMPRESSIONS  1. Left ventricular ejection fraction, by estimation, is 35 to 40%. The left ventricle has moderately decreased function. The left ventricle demonstrates global hypokinesis. The  left ventricular internal cavity size was mildly dilated. Left ventricular diastolic function could not be evaluated. The average left ventricular global longitudinal strain is 11.1 %. The global longitudinal strain is abnormal.  2. Right ventricular systolic function is low normal. The right ventricular size is mildly enlarged.  3. The mitral valve is normal in structure. Trivial mitral valve regurgitation.  4. The aortic valve is normal in structure. Aortic valve regurgitation is not visualized. Aortic valve sclerosis is present, with no evidence of aortic valve stenosis. FINDINGS  Left Ventricle: Left ventricular ejection fraction, by estimation, is 35 to 40%. The left ventricle has moderately decreased function. The left ventricle demonstrates global hypokinesis. The average left ventricular global longitudinal strain is 11.1 %.  Strain was performed and the global longitudinal strain is abnormal. The left ventricular internal cavity size was mildly dilated. There is no left ventricular hypertrophy. Left ventricular diastolic function could not be evaluated. Right  Ventricle: The right ventricular size is mildly enlarged. No increase in right ventricular wall thickness. Right ventricular systolic function is low normal. Left Atrium: Left atrial size was normal in size. Right Atrium: Right atrial size was normal in size. Pericardium: There is no evidence of pericardial effusion. Mitral Valve: The mitral valve is normal in structure. Trivial mitral valve regurgitation. Tricuspid Valve: The tricuspid valve is normal in structure. Tricuspid valve regurgitation is mild. Aortic Valve: The aortic valve is normal in structure. Aortic valve regurgitation is not visualized. Aortic valve sclerosis is present, with no evidence of aortic valve stenosis. Aortic valve peak gradient measures 5.8 mmHg. Pulmonic Valve: The pulmonic valve was normal in structure. Pulmonic valve regurgitation is not visualized. Aorta: The ascending aorta was not well visualized. IAS/Shunts: No atrial level shunt detected by color flow Doppler. Additional Comments: 3D was performed not requiring image post processing on an independent workstation and was abnormal.  LEFT VENTRICLE PLAX 2D LVIDd:         2.75 cm     Diastology LVIDs:         3.60 cm     LV e' medial:    19.00 cm/s LV PW:         1.00 cm     LV E/e' medial:  5.6 LV IVS:        3.15 cm     LV e' lateral:   13.80 cm/s LVOT diam:     1.80 cm     LV E/e' lateral: 7.7 LV SV:         45 LV SV Index:   29          2D Longitudinal Strain LVOT Area:     2.54 cm    2D Strain GLS (A4C):   10.2 %                            2D Strain GLS (A3C):   11.4 %                            2D Strain GLS (A2C):   11.7 % LV Volumes (MOD)           2D Strain GLS Avg:     11.1 % LV vol d, MOD A2C: 68.8 ml LV vol d, MOD A4C: 81.0 ml LV vol s, MOD A2C: 42.5 ml LV vol s, MOD A4C: 47.9 ml 3D Volume EF: LV SV MOD A2C:  26.3 ml 3D EF:        40 % LV SV MOD A4C:     81.0 ml LV EDV:       102 ml LV SV MOD BP:      29.1 ml LV ESV:       61 ml                            LV SV:         41 ml RIGHT VENTRICLE RV Basal diam:  3.00 cm RV S prime:     14.90 cm/s TAPSE (M-mode): 2.0 cm LEFT ATRIUM             Index        RIGHT ATRIUM           Index LA diam:        3.20 cm 2.09 cm/m   RA Area:     10.30 cm LA Vol (A2C):   51.0 ml 33.33 ml/m  RA Volume:   26.00 ml  16.99 ml/m LA Vol (A4C):   35.8 ml 23.39 ml/m LA Biplane Vol: 44.5 ml 29.08 ml/m  AORTIC VALVE                 PULMONIC VALVE AV Area (Vmax): 2.23 cm     PV Vmax:        0.87 m/s AV Vmax:        120.00 cm/s  PV Peak grad:   3.0 mmHg AV Peak Grad:   5.8 mmHg     RVOT Peak grad: 2 mmHg LVOT Vmax:      105.00 cm/s LVOT Vmean:     65.800 cm/s LVOT VTI:       0.176 m  AORTA Ao Root diam: 3.40 cm MITRAL VALVE MV Area (PHT): 6.37 cm     SHUNTS MV Decel Time: 119 msec     Systemic VTI:  0.18 m MV E velocity: 106.00 cm/s  Systemic Diam: 1.80 cm Cara JONETTA Lovelace MD Electronically signed by Cara JONETTA Lovelace MD Signature Date/Time: 10/16/2023/9:49:51 AM    Final    DG Chest Portable 1 View Result Date: 10/15/2023 CLINICAL DATA:  COPD exacerbation EXAM: PORTABLE CHEST - 1 VIEW COMPARISON:  02/23/2020 FINDINGS: Pulmonary hyperinflation with attenuated bronchovascular markings in both upper lobes. No focal airspace disease. Mild interstitial prominence in the lung bases, increased from previous. Heart size and mediastinal contours are within normal limits. Aortic Atherosclerosis (ICD10-170.0). Mild blunting of the lateral costophrenic angles. Visualized bones unremarkable. IMPRESSION: 1. Pulmonary hyperinflation with mild bibasilar interstitial prominence. 2. Possible small pleural effusions. Electronically Signed   By: JONETTA Faes M.D.   On: 10/15/2023 21:15    Assessment and Plan  78 year old female with a past medical history of chronic respiratory failure on 3 L home oxygen , chronic obstructive pulmonary disorder FEV1 of 0.6 L, pulmonary nodule, gastroesophageal reflux disease who presents to the emergency department with increasing  shortness of breath and dependent edema the patient is talking fluently on BiPAP reporting shortness of breath worsening since yesterday she reports increased sputum production and slightly increased cough denies any fever or chills she also reports approximately 2 months of increasing dependent edema she does endorse orthopnea. Patient does not smoke currently however was a heavy smoker many years ago. She follows with Dr. Theotis of pulmonary as outpatient  Acute hypoxic Hypercapneic respiratory failure severe end-stage COPD/emphysema chronic respiratory failure oxygen   dependent 3 L at home/steroid dependent COPD elevated D dimer ?PE -- patient currently on BiPAP. She is using accessory muscles for breathing. -- Marc[place] consultation with Dr. DELENA. -- Discussion led with patient's daughter outside the room regarding code status. Patient currently is DNR. Daughter will discuss with her family and patient if they decide to change the code status knowing risk and complications which were discussed at length by pulmonary and me. -- Palliative care consultation -- patient has overall a poor prognosis -- IV Solu-Medrol , IV Lasix , nebulizer, BiPAP -- trial of Xanax  PRN if patient able to take orally  Acute systolic congestive heart failure-- new -- elevated BNP, orthopnea, pulmonary vascular congestion noted on chest x-ray and EF of 35 to 40% with severe global hypokinesis -- IV Lasix  40 mg BID -- currently on losartan  and metoprolol  if patient can tolerate PO -- consider cardiology consultation  Failure to thrive/malnourished in the setting of severe COPD Difficulty swallowing -- will get speech therapy involved for swallow eval once patient off BiPAP  Elevated D dimer/? suspected PE -- empirically on Lovenox  1 mg per kilogram b.i.d. -- patient not able to lay flat and unstable for CT scan  Hyperlipidemia -- statins were unable to  DM (diabetes mellitus), type 2 (HCC) Type 2 diabetes mellitus  with anticipated steroid-induced hyperglycemia Have placed on sliding scale insulin     Procedures: Family communication : daughter, granddaughter Consults : pulmonary CODE STATUS: DNR DVT Prophylaxis : Lovenox  Level of care: Progressive Status is: Inpatient Remains inpatient appropriate because: severe respiratory failure, CHF    TOTAL critical TIME TAKING CARE OF THIS PATIENT: 50 minutes.  >50% time spent on counselling and coordination of care  Note: This dictation was prepared with Dragon dictation along with smaller phrase technology. Any transcriptional errors that result from this process are unintentional.  Leita Blanch M.D    [facility]   CC: Primary care physician; Rudolpho Norleen BIRCH, MD

## 2023-10-16 NOTE — Assessment & Plan Note (Addendum)
 Currently on oral prednisone .  Required IV Solu-Medrol  earlier in the hospital course.

## 2023-10-16 NOTE — Assessment & Plan Note (Addendum)
 Holding statin.  Continue to monitor liver function tests.  Likely from heart failure.

## 2023-10-16 NOTE — Progress Notes (Signed)
   10/16/23 0738  Assess: MEWS Score  Temp 97.7 F (36.5 C)  O2 Device (S)  Bi-PAP (pt unable to tolerate being off Bipap, MD and RT notified)  Assess: MEWS Score  MEWS Temp 0  MEWS Systolic 0  MEWS Pulse 2  MEWS RR 0  MEWS LOC 0  MEWS Score 2  MEWS Score Color Yellow  Assess: if the MEWS score is Yellow or Red  Were vital signs accurate and taken at a resting state? Yes  Does the patient meet 2 or more of the SIRS criteria? No  MEWS guidelines implemented  Yes, yellow  Treat  MEWS Interventions Considered administering scheduled or prn medications/treatments as ordered  Take Vital Signs  Increase Vital Sign Frequency  Yellow: Q2hr x1, continue Q4hrs until patient remains green for 12hrs  Escalate  MEWS: Escalate Yellow: Discuss with charge nurse and consider notifying provider and/or RRT  Notify: Charge Nurse/RN  Name of Charge Nurse/RN Notified Amy RN  Provider Notification  Provider Name/Title Patel MD  Date Provider Notified 10/16/23  Time Provider Notified 0740  Method of Notification Page (secure chat)  Notification Reason Other (Comment) (Yellow MEWS)  Provider response Evaluate remotely  Date of Provider Response 10/16/23  Time of Provider Response 0740  Assess: SIRS CRITERIA  SIRS Temperature  0  SIRS Respirations  0  SIRS Pulse 1  SIRS WBC 0  SIRS Score Sum  1

## 2023-10-16 NOTE — Consult Note (Signed)
 Palliative Medicine  Name: Monica Stewart Date: 10/16/2023 MRN: [id number]  DOB: 1945-07-20  Patient Care Team: Rudolpho Norleen BIRCH, MD as PCP - General (Internal Medicine)    REASON FOR CONSULTATION: IRIANNA Stewart is a 78 y.o. 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  Echo also showed EF of 35% patient is being treated for CHF as a contributing factor.  Health care was consulted to address goals.  SOCIAL HISTORY:     reports that she quit smoking about 34 years ago. She has never used smokeless tobacco. She reports that she does not drink alcohol  and does not use drugs.  Patient lives at home with her daughter.  She has another daughter who lives nearby.  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    ALLERGIES:  is allergic to lisinopril-hydrochlorothiazide .  MEDICATIONS:  Current Facility-Administered Medications  Medication Dose Route  Frequency Provider Last Rate Last Admin   acetaminophen  (TYLENOL ) tablet 650 mg  650 mg Oral Q6H PRN Core, Prentice BROCKS, MD       Or   acetaminophen  (TYLENOL ) suppository 650 mg  650 mg Rectal Q6H PRN Core, Prentice BROCKS, MD       albuterol  (PROVENTIL ) (2.5 MG/3ML) 0.083% nebulizer solution 2.5 mg  2.5 mg Nebulization Q2H PRN Core, Prentice BROCKS, MD   2.5 mg at 10/16/23 1106   calcium -vitamin D  (OSCAL WITH D) 500-5 MG-MCG per tablet 1 tablet  1 tablet Oral Daily Core, Prentice BROCKS, MD       diazepam  (VALIUM ) injection 2.5 mg  2.5 mg Intravenous Q6H PRN Patel, Sona, MD       enoxaparin  (LOVENOX ) injection 55 mg  1 mg/kg Subcutaneous BID Core, Prentice BROCKS, MD   55 mg at 10/16/23 9041   fluticasone  (FLONASE ) 50 MCG/ACT nasal spray 2 spray  2 spray Each Nare Daily Core, Prentice BROCKS, MD       furosemide  (LASIX ) injection 40 mg  40 mg Intravenous Q12H Core, Prentice BROCKS, MD   40 mg at 10/16/23 1101   insulin  aspart (novoLOG ) injection 0-5 Units  0-5 Units Subcutaneous QHS Patel, Sona, MD       insulin  aspart (novoLOG ) injection 0-9 Units  0-9 Units Subcutaneous TID WC  Patel, Sona, MD   1 Units at 10/16/23 1307   ipratropium-albuterol  (DUONEB) 0.5-2.5 (3) MG/3ML nebulizer solution 3 mL  3 mL Nebulization Q6H Patel, Sona, MD       losartan  (COZAAR ) tablet 100 mg  100 mg Oral Daily Core, Prentice BROCKS, MD       methylPREDNISolone  sodium succinate (SOLU-MEDROL ) 40 mg/mL injection 40 mg  40 mg Intravenous Q12H Patel, Sona, MD   40 mg at 10/16/23 9041   Followed by   NOREEN ON 10/17/2023] predniSONE  (DELTASONE ) tablet 40 mg  40 mg Oral Q breakfast Patel, Sona, MD       multivitamin with minerals tablet 1 tablet  1 tablet Oral Daily Core, Prentice BROCKS, MD       ondansetron  (ZOFRAN ) tablet 4 mg  4 mg Oral Q6H PRN Core, Prentice BROCKS, MD       Or   ondansetron  (ZOFRAN ) injection 4 mg  4 mg Intravenous Q6H PRN Core, Prentice BROCKS, MD       polyethylene glycol (MIRALAX  / GLYCOLAX ) packet 17 g  17 g Oral Daily PRN Core, Prentice BROCKS, MD        umeclidinium-vilanterol (ANORO ELLIPTA ) 62.5-25 MCG/ACT 1 puff  1 puff Inhalation Daily Core, Prentice BROCKS, MD        VITAL SIGNS: BP 118/70   Pulse (!) 109   Temp 98.2 F (36.8 C) (Axillary)   Resp (!) 24   Ht 5' 4 (1.626 m)   Wt 112 lb 3.4 oz (50.9 kg)   SpO2 100%   BMI 19.26 kg/m  Filed Weights   10/16/23 0110  Weight: 112 lb 3.4 oz (50.9 kg)    Estimated body mass index is 19.26 kg/m as calculated from the following:   Height as of this encounter: 5' 4 (1.626 m).   Weight as of this encounter: 112 lb 3.4 oz (50.9 kg).  LABS: CBC:    Component Value Date/Time   WBC 9.3 10/16/2023 0149   HGB 11.7 (L) 10/16/2023 0149   HGB 13.4 07/18/2013 1123   HCT 36.6 10/16/2023 0149   HCT 41.7 07/18/2013 1123   PLT 206 10/16/2023 0149   PLT 227 07/18/2013 1123   MCV 92.2 10/16/2023 0149   MCV 89 07/18/2013 1123   NEUTROABS 5.7 10/15/2023 2105   NEUTROABS 4.1 07/18/2013 1123   LYMPHSABS 1.2 10/15/2023 2105   LYMPHSABS 1.8 07/18/2013 1123   MONOABS 0.5 10/15/2023 2105   MONOABS 0.5 07/18/2013 1123   EOSABS 0.0 10/15/2023 2105   EOSABS 0.4 07/18/2013 1123   BASOSABS 0.1 10/15/2023 2105   BASOSABS 0.1 07/18/2013 1123   Comprehensive Metabolic Panel:    Component Value Date/Time   NA 137 10/16/2023 0149   K 4.4 10/16/2023 0149   K 3.5 07/18/2013 1123   CL 90 (L) 10/16/2023 0149   CO2 35 (H) 10/16/2023 0149   BUN 22 10/16/2023 0149   CREATININE 0.83 10/16/2023 0149   GLUCOSE 140 (H) 10/16/2023 0149   CALCIUM  9.6 10/16/2023 0149   AST 148 (H) 10/16/2023 0149   ALT 156 (H) 10/16/2023 0149   ALKPHOS 56 10/16/2023 0149   BILITOT 0.5 10/16/2023 0149   PROT 7.0 10/16/2023 0149   ALBUMIN 3.5 10/16/2023 0149    RADIOGRAPHIC STUDIES: ECHOCARDIOGRAM COMPLETE Result Date: 10/16/2023    ECHOCARDIOGRAM REPORT   Patient Name:   Monica Stewart Date of Exam: 10/16/2023 Medical Rec #:  [id number]       Height:       64.0  in Accession #:    [id number]      Weight:       112.2 lb Date of  Birth:  [birth year]       BSA:          1.530 m Patient Age:    78 years        BP:           137/88 mmHg Patient Gender: F               HR:           115 bpm. Exam Location:  [facility] Procedure: 2D Echo, 3D Echo, Cardiac Doppler, Color Doppler and Strain Analysis            (Both Spectral and Color Flow Doppler were utilized during            procedure). Indications:     Elevated Troponin  History:         Patient has prior history of Echocardiogram examinations, most                  recent 05/03/2023.  Sonographer:     Thedora Louder RDCS, FASE Referring Phys:  [id number] PRENTICE BROCKS CORE Diagnosing Phys: Cara JONETTA Lovelace MD  Sonographer Comments: Global longitudinal strain was attempted. IMPRESSIONS  1. Left ventricular ejection fraction, by estimation, is 35 to 40%. The left ventricle has moderately decreased function. The left ventricle demonstrates global hypokinesis. The left ventricular internal cavity size was mildly dilated. Left ventricular diastolic function could not be evaluated. The average left ventricular global longitudinal strain is 11.1 %. The global longitudinal strain is abnormal.  2. Right ventricular systolic function is low normal. The right ventricular size is mildly enlarged.  3. The mitral valve is normal in structure. Trivial mitral valve regurgitation.  4. The aortic valve is normal in structure. Aortic valve regurgitation is not visualized. Aortic valve sclerosis is present, with no evidence of aortic valve stenosis. FINDINGS  Left Ventricle: Left ventricular ejection fraction, by estimation, is 35 to 40%. The left ventricle has moderately decreased function. The left ventricle demonstrates global hypokinesis. The average left ventricular global longitudinal strain is 11.1 %.  Strain was performed and the global longitudinal strain is abnormal. The left ventricular internal cavity size was mildly dilated. There is no left ventricular hypertrophy. Left ventricular diastolic function could not  be evaluated. Right Ventricle: The right ventricular size is mildly enlarged. No increase in right ventricular wall thickness. Right ventricular systolic function is low normal. Left Atrium: Left atrial size was normal in size. Right Atrium: Right atrial size was normal in size. Pericardium: There is no evidence of pericardial effusion. Mitral Valve: The mitral valve is normal in structure. Trivial mitral valve regurgitation. Tricuspid Valve: The tricuspid valve is normal in structure. Tricuspid valve regurgitation is mild. Aortic Valve: The aortic valve is normal in structure. Aortic valve regurgitation is not visualized. Aortic valve sclerosis is present, with no evidence of aortic valve stenosis. Aortic valve peak gradient measures 5.8 mmHg. Pulmonic Valve: The pulmonic valve was normal in structure. Pulmonic valve regurgitation is not visualized. Aorta: The ascending aorta was not well visualized. IAS/Shunts: No atrial level shunt detected by color flow Doppler. Additional Comments: 3D was performed not requiring image post processing on an independent workstation and was abnormal.  LEFT VENTRICLE PLAX 2D LVIDd:         2.75 cm     Diastology LVIDs:  3.60 cm     LV e' medial:    19.00 cm/s LV PW:         1.00 cm     LV E/e' medial:  5.6 LV IVS:        3.15 cm     LV e' lateral:   13.80 cm/s LVOT diam:     1.80 cm     LV E/e' lateral: 7.7 LV SV:         45 LV SV Index:   29          2D Longitudinal Strain LVOT Area:     2.54 cm    2D Strain GLS (A4C):   10.2 %                            2D Strain GLS (A3C):   11.4 %                            2D Strain GLS (A2C):   11.7 % LV Volumes (MOD)           2D Strain GLS Avg:     11.1 % LV vol d, MOD A2C: 68.8 ml LV vol d, MOD A4C: 81.0 ml LV vol s, MOD A2C: 42.5 ml LV vol s, MOD A4C: 47.9 ml 3D Volume EF: LV SV MOD A2C:     26.3 ml 3D EF:        40 % LV SV MOD A4C:     81.0 ml LV EDV:       102 ml LV SV MOD BP:      29.1 ml LV ESV:       61 ml                             LV SV:        41 ml RIGHT VENTRICLE RV Basal diam:  3.00 cm RV S prime:     14.90 cm/s TAPSE (M-mode): 2.0 cm LEFT ATRIUM             Index        RIGHT ATRIUM           Index LA diam:        3.20 cm 2.09 cm/m   RA Area:     10.30 cm LA Vol (A2C):   51.0 ml 33.33 ml/m  RA Volume:   26.00 ml  16.99 ml/m LA Vol (A4C):   35.8 ml 23.39 ml/m LA Biplane Vol: 44.5 ml 29.08 ml/m  AORTIC VALVE                 PULMONIC VALVE AV Area (Vmax): 2.23 cm     PV Vmax:        0.87 m/s AV Vmax:        120.00 cm/s  PV Peak grad:   3.0 mmHg AV Peak Grad:   5.8 mmHg     RVOT Peak grad: 2 mmHg LVOT Vmax:      105.00 cm/s LVOT Vmean:     65.800 cm/s LVOT VTI:       0.176 m  AORTA Ao Root diam: 3.40 cm MITRAL VALVE MV Area (PHT): 6.37 cm     SHUNTS MV Decel Time: 119 msec     Systemic VTI:  0.18 m MV E velocity: 106.00  cm/s  Systemic Diam: 1.80 cm Cara JONETTA Lovelace MD Electronically signed by Cara JONETTA Lovelace MD Signature Date/Time: 10/16/2023/9:49:51 AM    Final    DG Chest Portable 1 View Result Date: 10/15/2023 CLINICAL DATA:  COPD exacerbation EXAM: PORTABLE CHEST - 1 VIEW COMPARISON:  02/23/2020 FINDINGS: Pulmonary hyperinflation with attenuated bronchovascular markings in both upper lobes. No focal airspace disease. Mild interstitial prominence in the lung bases, increased from previous. Heart size and mediastinal contours are within normal limits. Aortic Atherosclerosis (ICD10-170.0). Mild blunting of the lateral costophrenic angles. Visualized bones unremarkable. IMPRESSION: 1. Pulmonary hyperinflation with mild bibasilar interstitial prominence. 2. Possible small pleural effusions. Electronically Signed   By: JONETTA Faes M.D.   On: 10/15/2023 21:15    PERFORMANCE STATUS (ECOG) : 2 - Symptomatic, <50% confined to bed  Review of Systems Unless otherwise noted, a complete review of systems is negative.  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  Patient currently on BiPAP and respiratory status remains tenuous.  She is tachycardic on 100% FiO2.  Unable to tolerate even short time off BiPAP.  Patient previously decided to change CODE STATUS to DNR/DNI but then family question this decision.  I was asked to meet with patient and family to clarify goals.  I had a long conversation with patient and met with family (daughter, arnita is an Charity fundraiser, nephew and sister) separately and then together with patient.  Per family, at baseline, patient lives at home and is extremely limited in her ability to perform tasks.  Generally, patient stands in a tripod position and is chronically short of breath and anxious.  However, family reported being skeptical on whether this was truly related to patient's COPD or if patient just did not want to do things.  Patient and family verbalized understanding that BiPAP is a temporary intervention, and that if patient's condition declines, it would likely necessitate either intubation or transition to comfort measures.  Patient verbalized repeatedly that she would not want intubation and would prefer to be kept comfortable.  However, family relayed a strong opinion that patient should least try intubation.  Discussed with patient and family the risks and benefits of intubation including the high probability that weaning from the ventilator might prove challenging given her advanced COPD.  Patient and family plan to talk together prior to making any additional decisions on how to proceed with treatment.  PLAN: -Continue current scope of treatment -Patient and family discussing  goals  Case and plan discussed with Dr. Tobie  Time Total: 60 minutes  Visit consisted of counseling and education dealing with the complex and emotionally intense issues of symptom management and palliative care in the setting of serious and potentially life-threatening illness.Greater than 50%  of this time was spent counseling and coordinating care related to the above assessment and plan.  Signed by: Fonda Mower, PhD, NP-C

## 2023-10-16 NOTE — Progress Notes (Signed)
 PHARMACY - ANTICOAGULATION CONSULT NOTE  Pharmacy Consult for Lovenox  Indication: DVT  Allergies  Allergen Reactions   Lisinopril-Hydrochlorothiazide  Cough    Patient Measurements: Weight: 54.4 kg (119 lb 14.9 oz)  Vital Signs: Temp: 97.7 F (36.5 C) (07/20 0110) Temp Source: Axillary (07/20 0110) BP: 126/85 (07/20 0110) Pulse Rate: 112 (07/20 0110)  Labs: Recent Labs    10/15/23 2105 10/15/23 2239  HGB 12.2  --   HCT 39.5  --   PLT 234  --   CREATININE  --  0.92  TROPONINIHS 33* 48*    CrCl cannot be calculated (Unknown ideal weight.).   Medical History: Past Medical History:  Diagnosis Date   Anginal pain (HCC)    Asthma    GERD (gastroesophageal reflux disease)    H/O wheezing    History of orthopnea    Hypertension    Shortness of breath dyspnea     Medications:  Medications Prior to Admission  Medication Sig Dispense Refill Last Dose/Taking   albuterol  (VENTOLIN  HFA) 108 (90 Base) MCG/ACT inhaler Inhale 1-2 puffs into the lungs every 6 (six) hours as needed for wheezing or shortness of breath.   10/16/2023   alendronate (FOSAMAX) 70 MG tablet Take 70 mg by mouth once a week.   10/15/2023 Evening   amLODipine (NORVASC) 2.5 MG tablet Take 2.5 mg by mouth daily.   10/15/2023 Evening   Azelastine HCl 137 MCG/SPRAY SOLN Place 1 spray into both nostrils 2 (two) times daily.   10/15/2023 Evening   calcium  citrate-vitamin D  500-400 MG-UNIT chewable tablet Chew 1 tablet by mouth daily.    10/15/2023 Evening   Ferrous Fumarate (HEMOCYTE - 106 MG FE) 324 (106 Fe) MG TABS tablet Take 1 tablet by mouth every other day.   Unknown   fluticasone  (FLONASE ) 50 MCG/ACT nasal spray Place 2 sprays into both nostrils daily.   10/15/2023 Evening   furosemide  (LASIX ) 20 MG tablet Take 20 mg by mouth daily.   10/16/2023 Morning   Iron-Vitamins (GERITOL PO) Take 1 tablet by mouth daily.   Unknown   loratadine  (CLARITIN ) 10 MG tablet Take 10 mg by mouth daily.   10/15/2023 Evening    losartan  (COZAAR ) 100 MG tablet Take 100 mg by mouth daily.   10/15/2023 Evening   lovastatin (MEVACOR) 40 MG tablet Take 40 mg by mouth at bedtime.   10/15/2023 Evening   meloxicam (MOBIC) 7.5 MG tablet Take 7.5 mg by mouth daily.   10/16/2023 Morning   Multiple Vitamins-Minerals (MULTIVITAMIN WITH MINERALS) tablet Take 1 tablet by mouth daily.   10/16/2023 Morning   NON FORMULARY Take 1 capsule by mouth daily.   Unknown   predniSONE  (DELTASONE ) 5 MG tablet Take 5 mg by mouth daily.   10/16/2023 Morning   Roflumilast 250 MCG TABS Take 250 mcg by mouth daily.   10/15/2023 Evening   TRELEGY ELLIPTA 100-62.5-25 MCG/INH AEPB Inhale 1 puff into the lungs daily.   10/16/2023 Morning   hydrochlorothiazide  (HYDRODIURIL ) 25 MG tablet Take 25 mg by mouth daily. (Patient not taking: Reported on 10/16/2023)   Not Taking   Ipratropium-Albuterol  (COMBIVENT ) 20-100 MCG/ACT AERS respimat Inhale 1 puff into the lungs every 6 (six) hours for 7 days.  0     Assessment: Pharmacy consulted to dose lovenox  in this 78 year old female admitted with respiratory failure.  No prior anticoag noted.  SrCr = 0.92 mg/dl  Goal of Therapy:  Resolution of DVT  Monitor platelets by anticoagulation protocol: Yes  Plan:  Lovenox  55 mg ( 1 mg/kg) SQ Q12H ordered to start 7/20 @ ~ 0130. - CBC daily   Mykayla Brinton D 10/16/2023,1:23 AM

## 2023-10-16 NOTE — Consult Note (Signed)
 PULMONOLOGY         Date: 10/16/2023,   MRN# [id number] Monica Stewart 21-May-1945     AdmissionWeight: 50.9 kg                 CurrentWeight: 50.9 kg  Referring provider: Dr Core   CHIEF COMPLAINT:    Acute on chronic hypoxemic and hypercapnic respiratory failure  HISTORY OF PRESENT ILLNESS   This is a 78 yo F with stage 4 COPD and chronic hypoxemia on 3L/min [place] and recurrent hypercapnic encephalopathy.  Daughter reports courses of confusion at home even at rest. She is weak with deconditioning with baseline mMRC at 4.  She also has advanced Systolic CHF with EF <35% And a list of additional comorbidities. She came in to ER with worsening respiratory distress.  She has been compliant with combination ICS/LABA/LAMA at home with Trelegy once dialy. She also takes chronic prednisone  5mg  and Daliresp . She required BIPAP on arrival and is in respiratory distress. ABG with severe hypercapnic hypoxemic resp failure.  PCCM consultation for further evaluation and management.    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   CARDIAC CATHETERIZATION     CATARACT EXTRACTION W/PHACO Right 01/23/2015   Procedure: CATARACT EXTRACTION PHACO AND INTRAOCULAR LENS PLACEMENT (IOC);  Surgeon: Newell Ovens, MD;  Location: [facility] ORS;  Service: Ophthalmology;  Laterality: Right;  US             1.09 AP             17.7 CDE         12.16 casette lot # [id number]   EYE SURGERY     TUBAL LIGATION       FAMILY HISTORY   Family History  Problem Relation Age of Onset   Breast cancer Sister 29   Breast cancer Paternal Aunt       SOCIAL HISTORY   Social History   Tobacco Use   Smoking status: Former    Current packs/day: 0.00    Types: Cigarettes    Quit date: 01/22/1989    Years since quitting: 34.7   Smokeless tobacco: Never  Substance Use Topics   Alcohol  use: No   Drug use: No     MEDICATIONS    Home Medication:    Current Medication:  Current Facility-Administered Medications:    acetaminophen  (TYLENOL ) tablet 650 mg, 650 mg, Oral, Q6H PRN **OR** acetaminophen  (TYLENOL ) suppository 650 mg, 650 mg, Rectal, Q6H PRN, Core, Prentice BROCKS, MD   albuterol  (PROVENTIL ) (2.5 MG/3ML) 0.083% nebulizer solution 2.5 mg, 2.5 mg, Nebulization, Q2H PRN, Core, Prentice BROCKS, MD   ALPRAZolam  (XANAX ) tablet 0.25 mg, 0.25 mg, Oral, BID PRN, Patel, Sona, MD   calcium -vitamin D  (OSCAL WITH D) 500-5 MG-MCG per tablet 1 tablet, 1 tablet, Oral, Daily, Core, Prentice BROCKS, MD   enoxaparin  (LOVENOX ) injection 55 mg, 1 mg/kg, Subcutaneous, BID, Core, Prentice BROCKS, MD, 55 mg at 10/16/23 9041   fluticasone  (FLONASE ) 50 MCG/ACT nasal spray 2 spray, 2 spray, Each Nare, Daily, Core, Prentice BROCKS, MD  furosemide  (LASIX ) injection 40 mg, 40 mg, Intravenous, Q12H, Core, Prentice BROCKS, MD, 40 mg at 10/16/23 0117   insulin  aspart (novoLOG ) injection 0-5 Units, 0-5 Units, Subcutaneous, QHS, Patel, Sona, MD   insulin  aspart (novoLOG ) injection 0-9 Units, 0-9 Units, Subcutaneous, TID WC, Patel, Sona, MD   ipratropium-albuterol  (DUONEB) 0.5-2.5 (3) MG/3ML nebulizer solution 3 mL, 3 mL, Nebulization, Q6H, Patel, Sona, MD   losartan  (COZAAR ) tablet 100 mg, 100 mg, Oral, Daily, Core, Prentice BROCKS, MD   methylPREDNISolone  sodium succinate (SOLU-MEDROL ) 40 mg/mL injection 40 mg, 40 mg, Intravenous, Q12H, 40 mg at 10/16/23 0958 **FOLLOWED BY** [START ON 10/17/2023] predniSONE  (DELTASONE ) tablet 40 mg, 40 mg, Oral, Q breakfast, Patel, Sona, MD   multivitamin with minerals tablet 1 tablet, 1 tablet, Oral, Daily, Core, Prentice BROCKS, MD   ondansetron  (ZOFRAN ) tablet 4 mg, 4 mg,  Oral, Q6H PRN **OR** ondansetron  (ZOFRAN ) injection 4 mg, 4 mg, Intravenous, Q6H PRN, Core, Prentice BROCKS, MD   polyethylene glycol (MIRALAX  / GLYCOLAX ) packet 17 g, 17 g, Oral, Daily PRN, Core, Prentice BROCKS, MD   umeclidinium-vilanterol (ANORO ELLIPTA ) 62.5-25 MCG/ACT 1 puff, 1 puff, Inhalation, Daily, Core, Prentice BROCKS, MD    ALLERGIES   Lisinopril-hydrochlorothiazide      REVIEW OF SYSTEMS    Review of Systems:  Gen:  Denies  fever, sweats, chills weigh loss  HEENT: Denies blurred vision, double vision, ear pain, eye pain, hearing loss, nose bleeds, sore throat Cardiac:  No dizziness, chest pain or heaviness, chest tightness,edema Resp:   reports dyspnea chronically  Gi: Denies swallowing difficulty, stomach pain, nausea or vomiting, diarrhea, constipation, bowel incontinence Gu:  Denies bladder incontinence, burning urine Ext:   Denies Joint pain, stiffness or swelling Skin: Denies  skin rash, easy bruising or bleeding or hives Endoc:  Denies polyuria, polydipsia , polyphagia or weight change Psych:   Denies depression, insomnia or hallucinations   Other:  All other systems negative   VS: BP 137/88 (BP Location: Right Arm)   Pulse (!) 114   Temp 97.7 F (36.5 C) (Axillary)   Resp 20   Ht 5' 4 (1.626 m)   Wt 50.9 kg   SpO2 98%   BMI 19.26 kg/m      PHYSICAL EXAM    GENERAL:NAD, no fevers, chills, no weakness no fatigue HEAD: Normocephalic, atraumatic.  EYES: Pupils equal, round, reactive to light. Extraocular muscles intact. No scleral icterus.  MOUTH: Moist mucosal membrane. Dentition intact. No abscess noted.  EAR, NOSE, THROAT: Clear without exudates. No external lesions.  NECK: Supple. No thyromegaly. No nodules. No JVD.  PULMONARY: decreased breath sounds with mild rhonchi worse at bases bilaterally.  CARDIOVASCULAR: S1 and S2. Regular rate and rhythm. No murmurs, rubs, or gallops. No edema. Pedal pulses 2+ bilaterally.  GASTROINTESTINAL: Soft, nontender,  nondistended. No masses. Positive bowel sounds. No hepatosplenomegaly.  MUSCULOSKELETAL: No swelling, clubbing, or edema. Range of motion full in all extremities.  NEUROLOGIC: Cranial nerves II through XII are intact. No gross focal neurological deficits. Sensation intact. Reflexes intact.  SKIN: No ulceration, lesions, rashes, or cyanosis. Skin warm and dry. Turgor intact.  PSYCHIATRIC: Mood, affect within normal limits. The patient is awake, alert and oriented x 3. Insight, judgment intact.       IMAGING   CLINICAL DATA: COPD exacerbation  EXAM: PORTABLE CHEST - 1 VIEW  COMPARISON: 02/23/2020  FINDINGS: Pulmonary hyperinflation with attenuated bronchovascular markings in both upper lobes. No focal airspace disease. Mild interstitial prominence in the lung bases, increased from previous.  Heart size and mediastinal contours are within normal limits. Aortic Atherosclerosis (ICD10-170.0).  Mild blunting of the lateral costophrenic angles.  Visualized bones unremarkable.  IMPRESSION: 1. Pulmonary hyperinflation with mild bibasilar interstitial prominence. 2. Possible small pleural effusions.   Electronically Signed By: JONETTA Faes M.D. On: 10/15/2023 21:15   ASSESSMENT/PLAN   Acute on chronic hypoxemic hypercapnic respiratory failure    -due to Severe Acute COPD exacerbation    -possible culprit is viral vs bacterial infection     - CRP trend    - procalcitonin trend    - respiratory culture    - resp viral panel    - COVID/FLU/RSV negative    - continue COPD care path with steroids, nebs, abx    - IS as able , flutter as able     - repeat CXR in 48hr    -legionella and strep pneumoniae testing    -BODE score >8 with <20% long term survival    - palliative care consultation for possible comfort care / hospice evaluation     Hypercapnic respiratory failure with Encephalopathy    - continue BIPAP     - repeat VBG in 6 hr   Bibasilar atelelctasis      Chest  physiotherapy      - continue BIPAP      - PT/OT     Advanced Systolic CHF      - continue diuresis prn      - trans thoracic echo with both left and right heart dysfunction - ddimer to rule out PE      - LVEF <35%      Thank you for allowing me to participate in the care of this patient.   Patient/Family are satisfied with care plan and all questions have been answered.    Provider disclosure: Patient with at least one acute or chronic illness or injury that poses a threat to life or bodily function and is being managed actively during this encounter.  All of the below services have been performed independently by signing provider:  review of prior documentation from internal and or external health records.  Review of previous and current lab results.  Interview and comprehensive assessment during patient visit today. Review of current and previous chest radiographs/CT scans. Discussion of management and test interpretation with health care team and patient/family.   This document was prepared using Dragon voice recognition software and may include unintentional dictation errors.     Jeyda Siebel, M.D.  Division of Pulmonary & Critical Care Medicine

## 2023-10-16 NOTE — Assessment & Plan Note (Addendum)
 Monica Stewart

## 2023-10-16 NOTE — H&P (Addendum)
 History and Physical    Patient: Monica Stewart FMW:[id number] DOB: [birth year] DOA: 10/15/2023 DOS: the patient was seen and examined on 10/16/2023 PCP: Rudolpho Norleen BIRCH, MD  Patient coming from: Home  Chief Complaint:  Chief Complaint  Patient presents with   Shortness of Breath   HPI:  78 year old female with a past medical history of chronic respiratory failure on 3 L home oxygen , chronic obstructive pulmonary disorder FEV1 of 0.6 L, pulmonary nodule, gastroesophageal reflux disease who presents to the emergency department with increasing shortness of breath and dependent edema the patient is talking fluently on BiPAP reporting shortness of breath worsening since yesterday she reports increased sputum production and slightly increased cough denies any fever or chills she also reports approximately 2 months of increasing dependent edema she does endorse orthopnea but no paroxysmal nocturnal dyspnea.  She denies any recent medication changes reports compliance to her medications including her chronic prednisone  5 mg daily.  The patient's family member Fleeta at the bedside and assists confirming the history.  The patient denies any chest pain or any palpitations.  Past medical records reviewed and summarized: The patient has a pulmonology visit 09/26/2023 at that point the patient had chronic respiratory failure on 3 L home oxygen , FEV1 on outpatient PFTs 0.6 L and patient was chronically manage on Trelegy prednisone  5 mg daily and Daliresp 500 mg daily, also noted to have dependent edema on Lasix  20 mg daily although provider questioned if this was related to calcium  channel blocker usage also noted to have pulmonary nodules, reference of EF of 55% and no mention of diastolic function although primary echo cannot be seen.  Twelve-lead ECG) reviewed and interpreted the patient has sinus tachycardia ventricular rate in the 110s QTc 482 PR 148 the ST elevations seen in aVR are secondary to PVCs in the  absence of chest pain does not believe it is a primary cardiac process  The case was discussed with emergency department provider, discussed with the bedside nurse as well as the respiratory therapist.  No blood gas was ordered by the emergency department, stat ABG was ordered with follow-up ABG in 1 hour's time to review progress on BiPAP.   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 She has never used smokeless tobacco. She reports that she does not drink alcohol  and does not use drugs.  Allergies  Allergen Reactions   Lisinopril-Hydrochlorothiazide  Cough    Family History  Problem Relation Age of  Onset   Breast cancer Sister 98   Breast cancer Paternal Aunt     Prior to Admission medications   Medication Sig Start Date End Date Taking? Authorizing Provider  albuterol  (VENTOLIN  HFA) 108 (90 Base) MCG/ACT inhaler Inhale 1-2 puffs into the lungs every 6 (six) hours as needed for wheezing or shortness of breath.   Yes [provider]  alendronate (FOSAMAX) 70 MG tablet Take 70 mg by mouth once a week. 11/24/19  Yes [provider]  Azelastine HCl 137 MCG/SPRAY SOLN Place 1 spray into both  nostrils 2 (two) times daily.   Yes [provider]  fluticasone  (FLONASE ) 50 MCG/ACT nasal spray Place 2 sprays into both nostrils daily.   Yes [provider]  hydrochlorothiazide  (HYDRODIURIL ) 25 MG tablet Take 25 mg by mouth daily. 02/12/20  Yes [provider]  loratadine  (CLARITIN ) 10 MG tablet Take 10 mg by mouth daily.   Yes [provider]  losartan  (COZAAR ) 100 MG tablet Take 100 mg by mouth daily. 02/12/20  Yes [provider]  lovastatin (MEVACOR) 40 MG tablet Take 40 mg by mouth at bedtime.   Yes [provider]  meloxicam (MOBIC) 7.5 MG tablet Take 7.5 mg by mouth daily. 09/26/23 09/25/24 Yes [provider]  predniSONE  (DELTASONE ) 5 MG tablet Take 5 mg by mouth daily. 01/31/20  Yes [provider]  TRELEGY ELLIPTA 100-62.5-25 MCG/INH AEPB Inhale 1 puff into the lungs daily. 02/20/20  Yes [provider]  amLODipine (NORVASC) 2.5 MG tablet Take 2.5 mg by mouth daily. 02/12/20   [provider]  calcium  citrate-vitamin D  500-400 MG-UNIT chewable tablet Chew 1 tablet by mouth daily.     [provider]  guaiFENesin -dextromethorphan (ROBITUSSIN DM) 100-10 MG/5ML syrup Take 10 mLs by mouth every 4 (four) hours as needed for cough. 02/25/20   Awanda City, MD  Ipratropium-Albuterol  (COMBIVENT ) 20-100 MCG/ACT AERS respimat Inhale 1 puff into the lungs every 6 (six) hours for 7 days. 02/25/20 03/03/20  Awanda City, MD  Multiple Vitamins-Minerals (MULTIVITAMIN WITH MINERALS) tablet Take 1 tablet by mouth daily.    [provider]  Roflumilast 250 MCG TABS Take 250 mcg by mouth daily.    [provider]    Physical Exam: Vitals:   10/15/23 2230 10/15/23 2320 10/16/23 0000 10/16/23 0030  BP: 108/75 120/78 121/84 (!) 122/90  Pulse: (!) 26 (!) 108    Resp: (!) 28 20 17  (!) 22  Temp:      TempSrc:      SpO2: (!) 57% 100% 100% 100%  Seen approx 11:40 PM  10/16/2023 Room 12 of the ED  with bedside RN and family member Fleeta Constitutional:  Vital Signs as per [facility] than three noted]  Eyes:  Pink Conjunctiva and no Ptosis Neck:     Trachea Midline, Neck Symmetric             Thyroid without tenderness, palpable masses or nodules Respiratory:   Patient comfortable on BiPAP speaking in full sentences pulling full tidal volumes and exhaling fully but still dyspnea after long sentences Bilateral wheeze but no crackles Cardiovascular:   Heart Auscultated: Regular Regular without any added sounds or murmurs             Bilateral dependent edema symmetrical Gastrointestinal:  Abdomen soft and nontender without palpable masses, guarding or rebound  No Palpable Splenomegaly or Hepatomegaly Psychiatric:  Patient Orientated to Time, Place and Person Patient with appropriate mood and affect Recent  and Remote Memory Intact  Data Reviewed: Labs, Radiology, ECG as detailed in HPI and A/P   Assessment and Plan: * Acute respiratory failure (HCC) Acute on chronic hypoxic and hypercarbic respiratory failure Chest x-ray notable only for pulmonary hyperinflation with mild bibasilar interstitial prominences with possible small pleural effusions Suspect primarily COPD ex exacerbation but potentially also acute heart failure contribution given grossly elevated brain atretic peptide and dependent edema also potential pulmonary embolism Patient on 3 L home oxygen , using accessory muscles and desaturating less than 89% on nonrebreather on initial presentation Now pulling tidal volumes of greater than 350 mL off of BiPAP and comfortable alert and oriented speaking full sentences, initial gas venous is pH 7.21 pCO2 101 bicarb 40.4 Plan: Repeat blood gas ordered for 1 hour's time at 1 AM, continue BiPAP pending interval repeat evaluation, continuous pulse oximetry, addressing COPD exacerbation and acute heart failure as per below, empiric anticoagulation  Acute pulmonary embolism  (HCC) Concern for pulmonary embolism Patient has elevated troponin and brain atretic peptide as well as D-dimer elevation even when age-adjusted, at baseline family member reports patient cannot lay for CT which was part of her pulmonary nodule workup we will anticoagulate empirically, pharmacy consult placed for therapeutic enoxaparin  and obtain CTPA once the patient's clinical status improves and can lay flat, follow echocardiogram for any evidence of right heart strain  COPD with acute exacerbation (HCC) COPD exacerbation Patient with increasing dyspnea wheezing and sputum production with an FEV1 of 1.6 L at baseline Holding home prednisone  5 mg and Trelegy Plan: Methylprednisolone  125 mg twice daily IV for 1 day followed by prednisone  40 mg daily, scheduled DuoNeb and then as needed albuterol , have started long-acting beta agonist and long-acting antimuscarinic, pulmonary viral testing for influenza and COVID given her upper respiratory tract symptoms, ceftriaxone  given all cardinal symptoms of COPD exacerbation  Osteoporosis  Osteoporosis Continue bisphosphonate as outpatient   HLD (hyperlipidemia)   Hyperlipidemia Holding statin given repeat LFTs   HTN (hypertension)  Hypertension Holding amlodipine given the concern for dependent edema continue losartan  100 mg daily  Transaminitis Transaminitis Will hold statin given greater than 3 times upper limit of normal AST 132 ALT 145, will order gamma GT to see if of liver origin and order acute hepatitis panel for completeness   Sinus tachycardia Tachycardia Likely driven by the patient's acute respiratory failure Telemetry  Non-ischemic cardiomyopathy (HCC) Nontraumatic nonischemic myocardial injury Secondary to probable acute heart failure exacerbation or biomarker positive PE Initial troponin 48 followed by 33 Will follow echo for any regional wall motion abnormality but seems unlikely   Acute on chronic diastolic CHF  (congestive heart failure) (HCC)  Concern for acute diastolic heart failure Dependent edema, brain atretic peptide 1130 Echocardiogram, twice daily IV Lasix , holding patient's home hydrochlorothiazide    DM (diabetes mellitus), type 2 (HCC) Type 2 diabetes mellitus with anticipated steroid-induced hyperglycemia Have placed on sliding scale insulin       Advance Care Planning:   Code Status: Limited: Do not attempt resuscitation (DNR) -DNR-LIMITED -Do Not Intubate/DNI     Patient has been admitted to the progressive unit Patient reports she wishes to be DO NOT RESUSCITATE DO NOT INTUBATE, initially asking for 1 try however counseled the patient at length regarding her poor reserve and respiratory status and will likely complex critical care stay and she was clear on her wishes for DNR/DNI  Consults: Pulmonary (Established [facility]) [Secure Chat and EMR order sent]  Family Communication: at Bedside [facility]  Severity of  Illness: The appropriate patient status for this patient is INPATIENT. Inpatient status is judged to be reasonable and necessary in order to provide the required intensity of service to ensure the patient's safety. The patient's presenting symptoms, physical exam findings, and initial radiographic and laboratory data in the context of their chronic comorbidities is felt to place them at high risk for further clinical deterioration. Furthermore, it is not anticipated that the patient will be medically stable for discharge from the hospital within 2 midnights of admission.   * I certify that at the point of admission it is my clinical judgment that the patient will require inpatient hospital care spanning beyond 2 midnights from the point of admission due to high intensity of service, high risk for further deterioration and high frequency of surveillance required.*  Upon my evaluation, this patient had a high probability of imminent or life-threatening deterioration due to Acute  respiratory failure, acute COPD exacerbation, acute diastolic heart failure and probable pulmonary embolism., which required my direct attention, intervention, and personal management. I have personally provided 38 minutes of critical care time exclusive of time spent on separately billable procedures. Time includes review of laboratory data, radiology results, discussion with consultants, and monitoring for potential decompensation. Interventions were performed as documented above.   Author: Prentice JAYSON Lowenstein, MD 10/16/2023 12:49 AM  For on call review [email or web address].

## 2023-10-16 NOTE — Assessment & Plan Note (Addendum)
 Continue sliding scale insulin .  Hemoglobin A1c 6.7.  Sugars elevated secondary to steroids.

## 2023-10-16 NOTE — Assessment & Plan Note (Signed)
  Osteoporosis Continue bisphosphonate as outpatient

## 2023-10-16 NOTE — Assessment & Plan Note (Addendum)
 Holding statin with elevated liver function test

## 2023-10-16 NOTE — Assessment & Plan Note (Deleted)
 Tachycardia Likely driven by the patient's acute respiratory failure Telemetry

## 2023-10-16 NOTE — Progress Notes (Signed)
  Echocardiogram 2D Echocardiogram has been performed.  Thedora GORMAN Louder 10/16/2023, 9:23 AM

## 2023-10-16 NOTE — Plan of Care (Signed)

## 2023-10-16 NOTE — Progress Notes (Signed)
 OT Cancellation Note  Patient Details Name: Monica Stewart MRN: [id number] DOB: September 11, 1945   Cancelled Treatment:    Reason Eval/Treat Not Completed: Medical issues which prohibited therapy. Order received, cahrt reviewed and there is concern for acute pulmonary embolism. Per protocol with hold 24 hours and follow up with care team at that time. Initially dosing for PE treatment started at 0130am. Will re-attempt once medically stable.  Eldred Sooy E Madox Corkins 10/16/2023, 11:11 AM

## 2023-10-16 NOTE — Assessment & Plan Note (Deleted)
  Concern for acute diastolic heart failure Dependent edema, brain atretic peptide 1130 Echocardiogram, twice daily IV Lasix , holding patient's home hydrochlorothiazide

## 2023-10-17 ENCOUNTER — Other Ambulatory Visit (HOSPITAL_COMMUNITY): Payer: Self-pay

## 2023-10-17 ENCOUNTER — Telehealth (HOSPITAL_COMMUNITY): Payer: Self-pay | Admitting: Pharmacy Technician

## 2023-10-17 DIAGNOSIS — E785 Hyperlipidemia, unspecified: Secondary | ICD-10-CM | POA: Diagnosis not present

## 2023-10-17 DIAGNOSIS — Z7189 Other specified counseling: Secondary | ICD-10-CM

## 2023-10-17 DIAGNOSIS — I4892 Unspecified atrial flutter: Secondary | ICD-10-CM | POA: Diagnosis not present

## 2023-10-17 DIAGNOSIS — J441 Chronic obstructive pulmonary disease with (acute) exacerbation: Secondary | ICD-10-CM | POA: Diagnosis not present

## 2023-10-17 DIAGNOSIS — I5021 Acute systolic (congestive) heart failure: Secondary | ICD-10-CM | POA: Diagnosis not present

## 2023-10-17 DIAGNOSIS — J9601 Acute respiratory failure with hypoxia: Secondary | ICD-10-CM | POA: Diagnosis not present

## 2023-10-17 LAB — BASIC METABOLIC PANEL WITH GFR
Anion gap: 13 (ref 5–15)
BUN: 30 mg/dL — ABNORMAL HIGH (ref 8–23)
CO2: 37 mmol/L — ABNORMAL HIGH (ref 22–32)
Calcium: 8.8 mg/dL — ABNORMAL LOW (ref 8.9–10.3)
Chloride: 89 mmol/L — ABNORMAL LOW (ref 98–111)
Creatinine, Ser: 0.8 mg/dL (ref 0.44–1.00)
GFR, Estimated: >60 mL/min (ref >60)
Glucose, Bld: 115 mg/dL — ABNORMAL HIGH (ref 70–99)
Potassium: 4 mmol/L (ref 3.5–5.1)
Sodium: 139 mmol/L (ref 135–145)

## 2023-10-17 LAB — LEGIONELLA PNEUMOPHILA TOTAL AB: Legionella Pneumo Total Ab: NONREACTIVE

## 2023-10-17 LAB — GLUCOSE, CAPILLARY
Glucose-Capillary: 138 mg/dL — ABNORMAL HIGH (ref 70–99)
Glucose-Capillary: 132 mg/dL — ABNORMAL HIGH (ref 70–99)
Glucose-Capillary: 148 mg/dL — ABNORMAL HIGH (ref 70–99)
Glucose-Capillary: 196 mg/dL — ABNORMAL HIGH (ref 70–99)
Glucose-Capillary: 149 mg/dL — ABNORMAL HIGH (ref 70–99)

## 2023-10-17 LAB — STREP PNEUMONIAE URINARY ANTIGEN: Strep Pneumo Urinary Antigen: NEGATIVE

## 2023-10-17 LAB — PROCALCITONIN: Procalcitonin: 0.2 ng/mL

## 2023-10-17 MED ORDER — SPIRONOLACTONE 25 MG PO TABS
25.0000 mg | ORAL_TABLET | Freq: Every day | ORAL | Status: DC
  Administered 2023-10-17 – 2023-10-20 (×4): 25 mg via ORAL
  Filled 2023-10-17 (×4): qty 1

## 2023-10-17 MED ORDER — APIXABAN 5 MG PO TABS
5.0000 mg | ORAL_TABLET | Freq: Two times a day (BID) | ORAL | Status: DC
  Administered 2023-10-17 – 2023-10-25 (×17): 5 mg via ORAL
  Filled 2023-10-17 (×17): qty 1

## 2023-10-17 MED ORDER — DAPAGLIFLOZIN PROPANEDIOL 10 MG PO TABS
10.0000 mg | ORAL_TABLET | Freq: Every day | ORAL | Status: DC
  Administered 2023-10-17 – 2023-10-25 (×9): 10 mg via ORAL
  Filled 2023-10-17 (×9): qty 1

## 2023-10-17 MED ORDER — METOPROLOL TARTRATE 25 MG PO TABS
25.0000 mg | ORAL_TABLET | Freq: Two times a day (BID) | ORAL | Status: DC
  Administered 2023-10-17 – 2023-10-18 (×4): 25 mg via ORAL
  Filled 2023-10-17 (×4): qty 1

## 2023-10-17 MED ORDER — SODIUM CHLORIDE 0.9 % IV BOLUS
250.0000 mL | Freq: Once | INTRAVENOUS | Status: AC
  Administered 2023-10-17: 250 mL via INTRAVENOUS

## 2023-10-17 MED ORDER — ENSURE PLUS HIGH PROTEIN PO LIQD: 237.0000 mL | Freq: Two times a day (BID) | ORAL | Status: DC

## 2023-10-17 MED ORDER — METOPROLOL SUCCINATE ER 25 MG PO TB24
12.5000 mg | ORAL_TABLET | Freq: Every day | ORAL | Status: DC
  Administered 2023-10-17: 12.5 mg via ORAL
  Filled 2023-10-17: qty 1

## 2023-10-17 MED ORDER — DILTIAZEM HCL 25 MG/5ML IV SOLN
5.0000 mg | Freq: Once | INTRAVENOUS | Status: AC
  Administered 2023-10-17: 5 mg via INTRAVENOUS
  Filled 2023-10-17: qty 5

## 2023-10-17 NOTE — Progress Notes (Signed)
 OT Cancellation Note  Patient Details Name: Monica Stewart MRN: [id number] DOB: 1945-05-22   Cancelled Treatment:    Reason Eval/Treat Not Completed: Other (comment). Chart reviewed and pt pending further GOC discussion with pt/family and has been on continuous bipap. Per secure chat with MD, hold therapy services this date. Will follow up again as appropriate tomorrow.  Lasheika Ortloff E Simcha Speir 10/17/2023, 7:35 AM

## 2023-10-17 NOTE — Consult Note (Signed)
 [facility] CLINIC CARDIOLOGY CONSULT NOTE       Patient ID: Monica Stewart MRN: [id number] DOB/AGE: June 20, 1945 78 y.o.  Admit date: 10/15/2023 Referring Physician Dr. Tobie Primary Physician Rudolpho Norleen BIRCH, MD Primary Cardiologist Dr. Bosie (2015) Reason for Consultation Newly reduced EF  HPI: Monica Stewart is a 78 y.o. female  with a past medical history of hypertension, hyperlipidemia, chronic respiratory failure (3L), COPD, pulmonary nodule and patient reports hx of atrial flutter (years ago) who presented to the ED on 10/15/2023 for worsening fatigue, lower extremity swelling, fatigue for months.  Patient endorses orthopnea.  EKG on 07/21 and telemetry new onset atrial flutter relation.  Echo during this admission revealed a reduced EF 35-45%. Cardiology was consulted for further evaluation.   Work up in the ED notable for sodium 137, potassium 4.4, creatinine 0.83, hemoglobin 11.7, platelets 206.  D-dimer elevated at 1.18.  Pro-Cal within normal limits.  BNP elevated at 1100.  Chest x-ray with pulmonary vascular congestion.  Troponins minimally elevated and flat 33 > 48. EKG sinus bradycardia rate 111 bpm without acute ischemic changes.  EKG today with atrial flutter, rate 158 bpm (new onset).  Patient given IV Lasix  40 mg twice daily. Patient in acute on chronic respiratory distress requiring BiPAP.   At the time of my evaluation this afternoon, patient was resting comfortably in hospital bed with family at bedside. We discussed patients sxs in further detail. Patient states she's been having lower extremity edema for months. Also states worsening SOB. Patient endorses orthopnea for years. Patient denies any chest pain, palpitations or lightheadedness. Patient states she has no known history of HF. Patient reports she had 1 episode of atrial flutter years ago and was placed on aspirin .  Patient remains on BiPAP during evaluation and appears significantly volume up. Patient states she does  feel better since admission.  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   CARDIAC CATHETERIZATION     CATARACT EXTRACTION W/PHACO Right 01/23/2015   Procedure: CATARACT EXTRACTION PHACO AND INTRAOCULAR LENS PLACEMENT (IOC);  Surgeon: Newell Ovens, MD;  Location: [facility] ORS;  Service: Ophthalmology;  Laterality: Right;  US             1.09 AP             17.7 CDE         12.16 casette lot # [id number]   EYE SURGERY     TUBAL LIGATION      Medications Prior to Admission  Medication Sig Dispense Refill Last Dose/Taking   albuterol  (VENTOLIN  HFA) 108 (90 Base) MCG/ACT inhaler Inhale 1-2 puffs into the lungs every 6 (six) hours as needed for wheezing or shortness of breath.   10/16/2023   alendronate (FOSAMAX) 70 MG tablet Take 70 mg by mouth once a week.   10/15/2023 Evening   amLODipine (NORVASC) 2.5 MG tablet Take 2.5 mg by mouth daily.   10/15/2023 Evening   Azelastine HCl 137 MCG/SPRAY SOLN Place 1 spray into both nostrils 2 (two) times daily.   10/15/2023  Evening   calcium  citrate-vitamin D  500-400 MG-UNIT chewable tablet Chew 1 tablet by mouth daily.    10/15/2023 Evening   Ferrous Fumarate (HEMOCYTE - 106 MG FE) 324 (106 Fe) MG TABS tablet Take 1 tablet by mouth every other day.   Unknown   fluticasone  (FLONASE ) 50 MCG/ACT nasal spray Place 2 sprays into both nostrils daily.   10/15/2023 Evening   furosemide  (LASIX ) 20 MG tablet Take 20 mg by mouth daily.   10/16/2023 Morning   Iron-Vitamins (GERITOL PO) Take 1 tablet by mouth daily.   Unknown   loratadine  (CLARITIN ) 10 MG tablet Take 10 mg by mouth daily.    10/15/2023 Evening   losartan  (COZAAR ) 100 MG tablet Take 100 mg by mouth daily.   10/15/2023 Evening   lovastatin (MEVACOR) 40 MG tablet Take 40 mg by mouth at bedtime.   10/15/2023 Evening   meloxicam (MOBIC) 7.5 MG tablet Take 7.5 mg by mouth daily.   10/16/2023 Morning   Multiple Vitamins-Minerals (MULTIVITAMIN WITH MINERALS) tablet Take 1 tablet by mouth daily.   10/16/2023 Morning   NON FORMULARY Take 1 capsule by mouth daily.   Unknown   predniSONE  (DELTASONE ) 5 MG tablet Take 5 mg by mouth daily.   10/16/2023 Morning   Roflumilast 250 MCG TABS Take 250 mcg by mouth daily.   10/15/2023 Evening   TRELEGY ELLIPTA 100-62.5-25 MCG/INH AEPB Inhale 1 puff into the lungs daily.   10/16/2023 Morning   hydrochlorothiazide  (HYDRODIURIL ) 25 MG tablet Take 25 mg by mouth daily. (Patient not taking: Reported on 10/16/2023)   Not Taking   Ipratropium-Albuterol  (COMBIVENT ) 20-100 MCG/ACT AERS respimat Inhale 1 puff into the lungs every 6 (six) hours for 7 days.  0    Social History   Socioeconomic History   Marital status: Divorced    Spouse name: Not on file   Number of children: Not on file   Years of education: Not on file   Highest education level: Not on file  Occupational History   Not on file  Tobacco Use   Smoking status: Former    Current packs/day: 0.00    Types: Cigarettes    Quit date: 01/22/1989    Years since quitting: 34.7   Smokeless tobacco: Never  Substance and Sexual Activity   Alcohol  use: No   Drug use: No   Sexual activity: Not on file  Other Topics Concern   Not on file  Social History Narrative   Not on file   Social Drivers of Health   Financial Resource Strain: Low Risk  (06/27/2023)   Received from [facility]   Overall Financial Resource Strain (CARDIA)    Difficulty of Paying Living Expenses: Not very hard  Food Insecurity: Food Insecurity Present (06/27/2023)   Received from [facility]   Hunger Vital Sign    Within the  past 12 months, you worried that your food would run out before you got the money to buy more.: Never true    Within the past 12 months, the food you bought just didn't last and you didn't have money to get more.: Sometimes true  Transportation Needs: No Transportation Needs (06/27/2023)   Received from [facility]    In the past 12 months, has lack of transportation kept you from medical appointments or from getting medications?: No    Lack of Transportation (Non-Medical): No  Physical Activity: Not on file  Stress: Not on file  Social Connections: Not on file  Intimate Partner Violence: Not on file    Family History  Problem Relation Age of Onset   Breast cancer Sister 69   Breast cancer Paternal Aunt      Vitals:   10/17/23 1545 10/17/23 1550 10/17/23 1555 10/17/23 1600  BP:  106/81    Pulse: (!) 104 98 99 (!) 107  Resp: (!) 25 (!) 22 18 (!) 33  Temp:      TempSrc:      SpO2: 98% 97% 97% 100%  Weight:      Height:        PHYSICAL EXAM General: Chronically ill appearing female, well nourished, in no acute distress. HEENT: Normocephalic and atraumatic. Neck: No JVD.   Lungs: Normal respiratory effort on Bipap. Diminished breath sounds bilaterally Heart: HRR, elevated HR. Normal S1 and S2 without gallops or murmurs.  Abdomen: Non-distended appearing.  Msk: Normal strength and tone for age. Extremities: Warm and well perfused. No clubbing, cyanosis. 2+ pedal edema.  Neuro: Alert and oriented X 3. Psych: Answers questions appropriately.   Labs: Basic Metabolic Panel: Recent Labs    10/15/23 2239 10/16/23 0149 10/17/23 1015  NA 137 137 139  K 3.9 4.4 4.0  CL 94* 90* 89*  CO2 33* 35* 37*  GLUCOSE 198* 140* 115*  BUN 21 22 30*  CREATININE 0.92 0.83 0.80  CALCIUM  8.3* 9.6 8.8*  MG 2.6*  --   --    Liver Function Tests: Recent Labs    10/15/23 2239 10/16/23 0149  AST 132* 148*  ALT 145* 156*  ALKPHOS 58 56   BILITOT 0.6 0.5  PROT 6.6 7.0  ALBUMIN 3.5 3.5   No results for input(s): LIPASE, AMYLASE in the last 72 hours. CBC: Recent Labs    10/15/23 2105 10/16/23 0149  WBC 7.5 9.3  NEUTROABS 5.7  --   HGB 12.2 11.7*  HCT 39.5 36.6  MCV 95.0 92.2  PLT 234 206   Cardiac Enzymes: Recent Labs    10/15/23 2105 10/15/23 2239  TROPONINIHS 33* 48*   BNP: Recent Labs    10/15/23 2105  BNP 1,150.0*   D-Dimer: Recent Labs    10/15/23 2354 10/16/23 1404  DDIMER 1.44* 1.18*   Hemoglobin A1C: Recent Labs    10/16/23 0149  HGBA1C 6.7*   Fasting Lipid Panel: No results for input(s): CHOL, HDL, LDLCALC, TRIG, CHOLHDL, LDLDIRECT in the last 72 hours. Thyroid Function Tests: Recent Labs    10/15/23 2354  TSH 0.627   Anemia Panel: No results for input(s): VITAMINB12, FOLATE, FERRITIN, TIBC, IRON, RETICCTPCT in the last 72 hours.   Radiology: ECHOCARDIOGRAM COMPLETE Result Date: 10/16/2023    ECHOCARDIOGRAM REPORT   Patient Name:   GENEAL HUEBERT Date of Exam: 10/16/2023 Medical Rec #:  [id number]       Height:       64.0 in Accession #:    [id number]      Weight:       112.2 lb Date of Birth:  [birth year]       BSA:          1.530 m Patient Age:    77 years        BP:           137/88 mmHg Patient Gender: F               HR:           115 bpm. Exam Location:  [facility] Procedure: 2D Echo, 3D Echo, Cardiac Doppler, Color Doppler and Strain Analysis            (Both Spectral and Color Flow Doppler were utilized during            procedure). Indications:     Elevated Troponin  History:         Patient has prior history of Echocardiogram examinations, most                  recent 05/03/2023.  Sonographer:     Thedora Louder RDCS, FASE Referring Phys:  [id number] PRENTICE BROCKS CORE Diagnosing Phys: Cara JONETTA Lovelace MD  Sonographer Comments: Global longitudinal strain was attempted. IMPRESSIONS  1. Left ventricular ejection fraction, by estimation, is 35 to 40%. The left  ventricle has moderately decreased function. The left ventricle demonstrates global hypokinesis. The left ventricular internal cavity size was mildly dilated. Left ventricular diastolic function could not be evaluated. The average left ventricular global longitudinal strain is 11.1 %. The global longitudinal strain is abnormal.  2. Right ventricular systolic function is low normal. The right ventricular size is mildly enlarged.  3. The mitral valve is normal in structure. Trivial mitral valve regurgitation.  4. The aortic valve is normal in structure. Aortic valve regurgitation is not visualized. Aortic valve sclerosis is present, with no evidence of aortic valve stenosis. FINDINGS  Left Ventricle: Left ventricular ejection fraction, by estimation, is 35 to 40%. The left ventricle has moderately decreased function. The left ventricle demonstrates global hypokinesis. The average left ventricular global longitudinal strain is 11.1 %.  Strain was performed and the global longitudinal strain is abnormal. The left ventricular internal cavity size was mildly dilated. There is no left ventricular hypertrophy. Left ventricular diastolic function could not be evaluated. Right Ventricle: The right ventricular size is mildly enlarged. No increase in right ventricular wall thickness. Right ventricular systolic function is low normal. Left Atrium: Left atrial size was normal in size. Right Atrium: Right atrial size was normal in size. Pericardium: There is no evidence of pericardial effusion. Mitral Valve: The mitral valve is normal in structure. Trivial mitral valve regurgitation. Tricuspid Valve: The tricuspid valve is normal in structure. Tricuspid valve regurgitation is mild. Aortic Valve: The aortic valve is normal in structure. Aortic valve regurgitation is not visualized. Aortic valve sclerosis is present, with no evidence of aortic valve stenosis. Aortic valve peak gradient measures 5.8 mmHg. Pulmonic Valve: The pulmonic  valve was normal in structure. Pulmonic valve regurgitation is not visualized. Aorta: The ascending aorta was not well visualized. IAS/Shunts: No atrial level shunt detected by color flow Doppler. Additional Comments: 3D was performed not requiring image post processing on an independent workstation and was abnormal.  LEFT VENTRICLE PLAX 2D LVIDd:         2.75 cm     Diastology LVIDs:         3.60 cm     LV e' medial:    19.00 cm/s LV PW:         1.00 cm     LV E/e' medial:  5.6 LV IVS:        3.15 cm     LV e' lateral:   13.80 cm/s LVOT diam:     1.80 cm     LV E/e' lateral: 7.7 LV SV:         45 LV SV Index:   29          2D Longitudinal Strain  LVOT Area:     2.54 cm    2D Strain GLS (A4C):   10.2 %                            2D Strain GLS (A3C):   11.4 %                            2D Strain GLS (A2C):   11.7 % LV Volumes (MOD)           2D Strain GLS Avg:     11.1 % LV vol d, MOD A2C: 68.8 ml LV vol d, MOD A4C: 81.0 ml LV vol s, MOD A2C: 42.5 ml LV vol s, MOD A4C: 47.9 ml 3D Volume EF: LV SV MOD A2C:     26.3 ml 3D EF:        40 % LV SV MOD A4C:     81.0 ml LV EDV:       102 ml LV SV MOD BP:      29.1 ml LV ESV:       61 ml                            LV SV:        41 ml RIGHT VENTRICLE RV Basal diam:  3.00 cm RV S prime:     14.90 cm/s TAPSE (M-mode): 2.0 cm LEFT ATRIUM             Index        RIGHT ATRIUM           Index LA diam:        3.20 cm 2.09 cm/m   RA Area:     10.30 cm LA Vol (A2C):   51.0 ml 33.33 ml/m  RA Volume:   26.00 ml  16.99 ml/m LA Vol (A4C):   35.8 ml 23.39 ml/m LA Biplane Vol: 44.5 ml 29.08 ml/m  AORTIC VALVE                 PULMONIC VALVE AV Area (Vmax): 2.23 cm     PV Vmax:        0.87 m/s AV Vmax:        120.00 cm/s  PV Peak grad:   3.0 mmHg AV Peak Grad:   5.8 mmHg     RVOT Peak grad: 2 mmHg LVOT Vmax:      105.00 cm/s LVOT Vmean:     65.800 cm/s LVOT VTI:       0.176 m  AORTA Ao Root diam: 3.40 cm MITRAL VALVE MV Area (PHT): 6.37 cm     SHUNTS MV Decel Time: 119 msec      Systemic VTI:  0.18 m MV E velocity: 106.00 cm/s  Systemic Diam: 1.80 cm Cara JONETTA Lovelace MD Electronically signed by Cara JONETTA Lovelace MD Signature Date/Time: 10/16/2023/9:49:51 AM    Final    DG Chest Portable 1 View Result Date: 10/15/2023 CLINICAL DATA:  COPD exacerbation EXAM: PORTABLE CHEST - 1 VIEW COMPARISON:  02/23/2020 FINDINGS: Pulmonary hyperinflation with attenuated bronchovascular markings in both upper lobes. No focal airspace disease. Mild interstitial prominence in the lung bases, increased from previous. Heart size and mediastinal contours are within normal limits. Aortic Atherosclerosis (ICD10-170.0). Mild blunting of the lateral costophrenic angles. Visualized bones unremarkable. IMPRESSION: 1. Pulmonary hyperinflation with  mild bibasilar interstitial prominence. 2. Possible small pleural effusions. Electronically Signed   By: JONETTA Faes M.D.   On: 10/15/2023 21:15    ECHO as above  TELEMETRY reviewed by me 10/17/2023: sinus tachycardia, rate 100s   EKG reviewed by me: atrial flutter, rate 158 bpm (new onset).  Data reviewed by me 10/17/2023: last 24h vitals tele labs imaging I/O ED provider note, admission H&P.  Principal Problem:   Acute respiratory failure (HCC) Active Problems:   COPD with acute exacerbation (HCC)   Acute pulmonary embolism (HCC)   DM (diabetes mellitus), type 2 (HCC)   Acute on chronic diastolic CHF (congestive heart failure) (HCC)   Non-ischemic cardiomyopathy (HCC)   Sinus tachycardia   Transaminitis   HTN (hypertension)   HLD (hyperlipidemia)   Osteoporosis   Acute clinical systolic heart failure (HCC)   Palliative care encounter    ASSESSMENT AND PLAN:  TORIA MONTE is a 78 y.o. female  with a past medical history of hypertension, hyperlipidemia, chronic respiratory failure (3L), COPD, pulmonary nodule and patient reports hx of atrial flutter (years ago) who presented to the ED on 10/15/2023 for worsening fatigue, lower extremity swelling,  fatigue for months.  Patient endorses orthopnea.  EKG on 07/21 and telemetry new onset atrial flutter relation.  Echo during this admission revealed a reduced EF 35-45%. Cardiology was consulted for further evaluation.  # New onset HFrEF # Acute on chronic respiratory failure # COPD exacerbation BNP elevated at 1100.  Chest x-ray with pulmonary vascular congestion.  This admission reveals newly reduced EF of 35-40% with global hypokinesis. - Continue IV Lasix  40 mg twice daily.  Closely monitor UOP and renal function. - Ordered dapagliflozin  10 mg daily. (Copay $0) - Continue losartan  100 mg daily. - Continue metoprolol  tartrate 25 mg twice daily.  - Order spironolactone  25 mg daily.  # New onset Atrial fibrillation/flutter RVR EKG (07/21) with atrial flutter, rate 158 bpm (new onset). Per tele episode atrial fibrillation RVR. Per tele now remains in sinus tachycardia rate 100s.  -Ordered Eliquis  5 mg twice daily for stroke risk reduction.  CHA2DS2-VASc score is at least 6. Patient denies any recent falls, or prior major bleeding.  -Metoprolol  as stated above.  Uptitrate for elevated heart rate if BP allows.  # Hypertension # Hyperlipidemia Troponins minimally elevated and flat 33 > 48. EKG without acute ischemic changes. BP stable. - Continue losartan , metoprolol  as stated above.  This patient's plan of care was discussed and created with Dr. Ammon and he is in agreement.  Signed: Dorene Comfort, PA-C  10/17/2023, 4:15 PM [facility] Cardiology

## 2023-10-17 NOTE — Plan of Care (Signed)

## 2023-10-17 NOTE — Progress Notes (Signed)
 Pt converted to Afib on tele around 0200 per CCMD call. Pt HR 140s-170s on monitor. Pt denies CP and excessive SOB at this time. Notified MD. EKG performed and pt given Cardizem  IVP and bolus of NS per MD orders, see EMR. Notified MD of EKG findings. Pt showing SR/ST with HR 90s to 110s on tele at this time. Pt resting in bed comfortably on continuous bipap. No additional complaints or requests at this time. Will continue to monitor closely.

## 2023-10-17 NOTE — Progress Notes (Signed)
 PT Cancellation Note  Patient Details Name: ARDETH REPETTO MRN: [id number] DOB: 20-Dec-1945   Cancelled Treatment:    Reason Eval/Treat Not Completed: Medical issues which prohibited therapy;Patient not medically ready. Chart reviewed and pt pending further GOC discussion with pt/family and has been on continuous bipap. Per secure chat with MD, hold therapy services this date. Will follow up again as appropriate tomorrow.    Doyal Shams PT, DPT 8:11 AM,10/17/23

## 2023-10-17 NOTE — Telephone Encounter (Signed)
 Pharmacy Patient Advocate Encounter  Insurance verification completed.    The patient is insured through [employer].     Ran test claim for Eliquis  5mg  tablets and the current 30 day co-pay is $0.00.  Ran test claim for Jardiance 10gm tablets and the current 30 day co-pay is $0.00.  Ran test claim for Farxiga  10mg  tablets and the current 30 day co-pay is $0.00.   This test claim was processed through [facility]- copay amounts may vary at other pharmacies due to pharmacy/plan contracts, or as the patient moves through the different stages of their insurance plan.

## 2023-10-17 NOTE — Progress Notes (Signed)
 [facility]  - [place] at [facility]   PATIENT NAME: Monica Stewart    MR#:  [id number]  DATE OF BIRTH:  April 06, 1945  SUBJECTIVE:  granddaughter at bedside. Patient Menz on BiPAP. She appears quite anxious. Sartorial persona BiPAP. Starts drop-down with minimal activity and without BiPAP. Took a few bites of mashed potato last evening per daughter. Heart rate down in the 100s. Spikes up with exertion or movement.   VITALS:  Blood pressure (!) 131/92, pulse 100, temperature (!) 97.3 F (36.3 C), temperature source Axillary, resp. rate (!) 22, height 5' 4 (1.626 m), weight 50.9 kg, SpO2 99%.  PHYSICAL EXAMINATION:   GENERAL:  78 y.o.-year-old patient with moderate acute distress. Frail, malnourished LUNGS: distant breath sounds bilaterally, no wheezing on BiPAP CARDIOVASCULAR: S1, S2 normal. No murmur tachycardia ABDOMEN: Soft, nontender, nondistended.  EXTREMITIES: No  edema b/l.    NEUROLOGIC: nonfocal  patient is alert and awake, anxious   LABORATORY PANEL:  CBC Recent Labs  Lab 10/16/23 0149  WBC 9.3  HGB 11.7*  HCT 36.6  PLT 206    Chemistries  Recent Labs  Lab 10/15/23 2239 10/16/23 0149  NA 137 137  K 3.9 4.4  CL 94* 90*  CO2 33* 35*  GLUCOSE 198* 140*  BUN 21 22  CREATININE 0.92 0.83  CALCIUM  8.3* 9.6  MG 2.6*  --   AST 132* 148*  ALT 145* 156*  ALKPHOS 58 56  BILITOT 0.6 0.5   Cardiac Enzymes No results for input(s): TROPONINI in the last 168 hours. RADIOLOGY:  ECHOCARDIOGRAM COMPLETE Result Date: 10/16/2023    ECHOCARDIOGRAM REPORT   Patient Name:   Monica Stewart Date of Exam: 10/16/2023 Medical Rec #:  [id number]       Height:       64.0 in Accession #:    [id number]      Weight:       112.2 lb Date of Birth:  July 06, 1945       BSA:          1.530 m Patient Age:    77 years        BP:           137/88 mmHg Patient Gender: F               HR:           115 bpm. Exam Location:  [facility] Procedure: 2D Echo, 3D Echo, Cardiac Doppler, Color  Doppler and Strain Analysis            (Both Spectral and Color Flow Doppler were utilized during            procedure). Indications:     Elevated Troponin  History:         Patient has prior history of Echocardiogram examinations, most                  recent 05/03/2023.  Sonographer:     Thedora Louder RDCS, FASE Referring Phys:  [id number] PRENTICE BROCKS CORE Diagnosing Phys: Cara JONETTA Lovelace MD  Sonographer Comments: Global longitudinal strain was attempted. IMPRESSIONS  1. Left ventricular ejection fraction, by estimation, is 35 to 40%. The left ventricle has moderately decreased function. The left ventricle demonstrates global hypokinesis. The left ventricular internal cavity size was mildly dilated. Left ventricular diastolic function could not be evaluated. The average left ventricular global longitudinal strain is 11.1 %. The global longitudinal strain is abnormal.  2. Right ventricular systolic  function is low normal. The right ventricular size is mildly enlarged.  3. The mitral valve is normal in structure. Trivial mitral valve regurgitation.  4. The aortic valve is normal in structure. Aortic valve regurgitation is not visualized. Aortic valve sclerosis is present, with no evidence of aortic valve stenosis. FINDINGS  Left Ventricle: Left ventricular ejection fraction, by estimation, is 35 to 40%. The left ventricle has moderately decreased function. The left ventricle demonstrates global hypokinesis. The average left ventricular global longitudinal strain is 11.1 %.  Strain was performed and the global longitudinal strain is abnormal. The left ventricular internal cavity size was mildly dilated. There is no left ventricular hypertrophy. Left ventricular diastolic function could not be evaluated. Right Ventricle: The right ventricular size is mildly enlarged. No increase in right ventricular wall thickness. Right ventricular systolic function is low normal. Left Atrium: Left atrial size was normal in size. Right  Atrium: Right atrial size was normal in size. Pericardium: There is no evidence of pericardial effusion. Mitral Valve: The mitral valve is normal in structure. Trivial mitral valve regurgitation. Tricuspid Valve: The tricuspid valve is normal in structure. Tricuspid valve regurgitation is mild. Aortic Valve: The aortic valve is normal in structure. Aortic valve regurgitation is not visualized. Aortic valve sclerosis is present, with no evidence of aortic valve stenosis. Aortic valve peak gradient measures 5.8 mmHg. Pulmonic Valve: The pulmonic valve was normal in structure. Pulmonic valve regurgitation is not visualized. Aorta: The ascending aorta was not well visualized. IAS/Shunts: No atrial level shunt detected by color flow Doppler. Additional Comments: 3D was performed not requiring image post processing on an independent workstation and was abnormal.  LEFT VENTRICLE PLAX 2D LVIDd:         2.75 cm     Diastology LVIDs:         3.60 cm     LV e' medial:    19.00 cm/s LV PW:         1.00 cm     LV E/e' medial:  5.6 LV IVS:        3.15 cm     LV e' lateral:   13.80 cm/s LVOT diam:     1.80 cm     LV E/e' lateral: 7.7 LV SV:         45 LV SV Index:   29          2D Longitudinal Strain LVOT Area:     2.54 cm    2D Strain GLS (A4C):   10.2 %                            2D Strain GLS (A3C):   11.4 %                            2D Strain GLS (A2C):   11.7 % LV Volumes (MOD)           2D Strain GLS Avg:     11.1 % LV vol d, MOD A2C: 68.8 ml LV vol d, MOD A4C: 81.0 ml LV vol s, MOD A2C: 42.5 ml LV vol s, MOD A4C: 47.9 ml 3D Volume EF: LV SV MOD A2C:     26.3 ml 3D EF:        40 % LV SV MOD A4C:     81.0 ml LV EDV:       102 ml LV  SV MOD BP:      29.1 ml LV ESV:       61 ml                            LV SV:        41 ml RIGHT VENTRICLE RV Basal diam:  3.00 cm RV S prime:     14.90 cm/s TAPSE (M-mode): 2.0 cm LEFT ATRIUM             Index        RIGHT ATRIUM           Index LA diam:        3.20 cm 2.09 cm/m   RA Area:      10.30 cm LA Vol (A2C):   51.0 ml 33.33 ml/m  RA Volume:   26.00 ml  16.99 ml/m LA Vol (A4C):   35.8 ml 23.39 ml/m LA Biplane Vol: 44.5 ml 29.08 ml/m  AORTIC VALVE                 PULMONIC VALVE AV Area (Vmax): 2.23 cm     PV Vmax:        0.87 m/s AV Vmax:        120.00 cm/s  PV Peak grad:   3.0 mmHg AV Peak Grad:   5.8 mmHg     RVOT Peak grad: 2 mmHg LVOT Vmax:      105.00 cm/s LVOT Vmean:     65.800 cm/s LVOT VTI:       0.176 m  AORTA Ao Root diam: 3.40 cm MITRAL VALVE MV Area (PHT): 6.37 cm     SHUNTS MV Decel Time: 119 msec     Systemic VTI:  0.18 m MV E velocity: 106.00 cm/s  Systemic Diam: 1.80 cm Cara JONETTA Lovelace MD Electronically signed by Cara JONETTA Lovelace MD Signature Date/Time: 10/16/2023/9:49:51 AM    Final    DG Chest Portable 1 View Result Date: 10/15/2023 CLINICAL DATA:  COPD exacerbation EXAM: PORTABLE CHEST - 1 VIEW COMPARISON:  02/23/2020 FINDINGS: Pulmonary hyperinflation with attenuated bronchovascular markings in both upper lobes. No focal airspace disease. Mild interstitial prominence in the lung bases, increased from previous. Heart size and mediastinal contours are within normal limits. Aortic Atherosclerosis (ICD10-170.0). Mild blunting of the lateral costophrenic angles. Visualized bones unremarkable. IMPRESSION: 1. Pulmonary hyperinflation with mild bibasilar interstitial prominence. 2. Possible small pleural effusions. Electronically Signed   By: JONETTA Faes M.D.   On: 10/15/2023 21:15    Assessment and Plan  78 year old female with a past medical history of chronic respiratory failure on 3 L home oxygen , chronic obstructive pulmonary disorder FEV1 of 0.6 L, pulmonary nodule, gastroesophageal reflux disease who presents to the emergency department with increasing shortness of breath and dependent edema the patient is talking fluently on BiPAP reporting shortness of breath worsening since yesterday she reports increased sputum production and slightly increased cough denies any  fever or chills she also reports approximately 2 months of increasing dependent edema she does endorse orthopnea. Patient does not smoke currently however was a heavy smoker many years ago. She follows with Dr. Theotis of pulmonary as outpatient  Acute hypoxic Hypercapneic respiratory failure severe end-stage COPD/emphysema chronic respiratory failure oxygen  dependent 3 L at home/steroid dependent COPD elevated D dimer ?PE -- patient currently on BiPAP. She is using accessory muscles for breathing. GITA Lipps consultation with Dr. DELENA. -- Discussion led with patient's  daughter outside the room regarding code status. Patient currently is DNR. Daughter will discuss with her family and patient if they decide to change the code status knowing risk and complications which were discussed at length by pulmonary and me. -- Palliative care consultation -- patient has overall a poor prognosis -- IV Solu-Medrol , IV Lasix , nebulizer, BiPAP -- trial of Xanax  PRN if patient able to take orally  Acute systolic congestive heart failure-- new atrial fibrillation new onset -- elevated BNP, orthopnea, pulmonary vascular congestion noted on chest x-ray and EF of 35 to 40% with severe global hypokinesis -- IV Lasix  40 mg BID -- currently on losartan  and metoprolol  if patient can tolerate PO --  cardiology consultation with [facility] clinic. -- Started on Spironolactone ,farxiga , losartan , metoprolol  -- per cardiology now on eliquis   Failure to thrive/malnourished in the setting of severe COPD Difficulty swallowing --  speech therapy involved for swallow eval once patient off BiPAP  Elevated D dimer/? suspected PE -- empirically on Lovenox  1 mg per kilogram b.i.d.-- now on eliquis  -- patient not able to lay flat and unstable for CT scan  Hyperlipidemia -- statins   DM (diabetes mellitus), type 2 (HCC) Type 2 diabetes mellitus with anticipated steroid-induced hyperglycemia Have placed on sliding scale  insulin   FTT/moderate to severe malnutrition --RD to see    Palliative care consultation appreciated.  Per RN note from last night patient's code status was changed to full code. Patient made that decision since she wanted to meet her grandson who was coming from out of town She continues to be full-court for now. Family understands poor prognosis overall. Continue discussion for goals of care with palliative care    Procedures: Family communication : daughter Nat, granddaughter Consults : pulmonary CODE STATUS: DNR DVT Prophylaxis : Lovenox  Level of care: Progressive Status is: Inpatient Remains inpatient appropriate because: severe respiratory failure, CHF, FTT    TOTAL critical TIME TAKING CARE OF THIS PATIENT: 50 minutes.  >50% time spent on counselling and coordination of care  Note: This dictation was prepared with Dragon dictation along with smaller phrase technology. Any transcriptional errors that result from this process are unintentional.  Leita Blanch M.D    [facility]   CC: Primary care physician; Rudolpho Norleen BIRCH, MD

## 2023-10-17 NOTE — Evaluation (Signed)
 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   CARDIAC CATHETERIZATION     CATARACT EXTRACTION W/PHACO Right 01/23/2015   Procedure: CATARACT EXTRACTION PHACO AND INTRAOCULAR LENS PLACEMENT (IOC);  Surgeon: Newell Ovens, MD;  Location: [facility] ORS;  Service: Ophthalmology;  Laterality: Right;  US             1.09 AP             17.7 CDE         12.16 casette lot # [id number]   EYE SURGERY     TUBAL LIGATION     HPI:  Per H&P: 78 year old female with a past medical history of chronic respiratory failure on 3 L home oxygen , chronic obstructive pulmonary disorder FEV1 of 0.6 L, pulmonary nodule, gastroesophageal reflux disease who presents to the emergency department with increasing shortness of breath and dependent edema the patient is talking fluently on BiPAP reporting shortness of breath worsening since yesterday she reports increased sputum production and slightly increased cough denies any fever or chills she also reports approximately 2 months of increasing dependent edema she does endorse orthopnea but no paroxysmal nocturnal dyspnea.  She denies any recent medication changes reports compliance to her medications including her chronic prednisone  5 mg daily.  The patient's family member Fleeta at the bedside and assists confirming the history.  The patient denies any chest pain or any  palpitations. DG Chest 7/19:  Pulmonary hyperinflation with mild bibasilar interstitial  prominence.  2. Possible small pleural effusions.    Assessment / Plan / Recommendation  Clinical Impression  Pt seen for bedside swallow evaluation in the setting of report of difficulty swallowing. Currently, greatest risk factor for aspiration is pt's reliance on BiPAP, with report of switching back and forth to nasal canula to eat- though pt with significant anxiety on nasal canula. Medication utilized to manage anxiety.   Upon therapist entrance to room, pt switched to 6L nasal canula. O2 saturation maintained at 95 and greater for duration of session. Pt also with elevated HR (140s), with verbal cues provided for slowing rate/relaxing. Pt edentulous- dentures present, but pt refused placement.   Trials completed for thin liquids via cup, puree, and soft solids. Total assist for feeding provided by family. Pt with request for fast rate, with impulsivity likely contributing to elevated HR. No overt or subtle s/sx pharyngeal dysphagia noted. No change to vocal quality across trials. Oral phase min increased secondary to dentition, though with eventual oral clearance.   Per pt/family interview/chart review, pt with history of GERD with frequent belching and report of globus sensation with solids. Further family reports pt with SOB following serial sips of thin liquid. Education provided for impact of severe COPD (per chart) on dyspnea period during swallow. Further education shared regarding GERD/reflux precautions to increase comfort/safety with swallow. Family reported understanding.  Recommend Dys 2 (Chopped solids) and thin liquids. Pt is at increased risk for aspiration in the setting of pulm/resp status, GERD, deconditioning, dentition, impulsivity, and anxiety on nasal canula. Closely monitor O2 saturations during intake. STRICT aspiration precautions (slow rate, small bites, elevated HOB, and alert for PO  intake). Esophageal precautions: slow rate of intake, smaller more frequent meals, follow bites of food with liquids, remain upright for 30-60 minutes after eating, do not eat/drink 2-3 hours before bedtime except water, elevate head of bed at least 30 degrees.   MD and RN aware of risk for aspiration and diet recommendations. SLP will follow up with additional education.    SLP Visit Diagnosis: Dysphagia, unspecified (R13.10) (?esophageal)    Aspiration Risk  Moderate aspiration risk    Diet Recommendation   Thin;Dysphagia 2 (chopped)  Medication Administration: Whole meds with liquid (vs puree)    Other  Recommendations Recommended Consults: Consider GI evaluation (consider OP consult in the setting of report of globus sensation and frequent belching) Oral Care Recommendations: Oral care QID;Staff/trained caregiver to provide oral care     Assistance Recommended at Discharge  Assist/supervision for PO intake  Functional Status Assessment Patient has had a recent decline in their functional status and demonstrates the ability to make significant improvements in function in a reasonable and predictable amount of time.  Frequency and Duration min 2x/week  2 weeks       Prognosis Prognosis for improved oropharyngeal function: Fair Barriers to Reach Goals: Severity of deficits (severe pulmonary concerns, reflux, impulsivity)      Swallow Study   General Date of Onset: 10/17/23 HPI: Per H&P: 78 year old female with a past medical history of chronic respiratory failure on 3 L home oxygen , chronic obstructive pulmonary disorder FEV1 of 0.6 L, pulmonary nodule, gastroesophageal reflux disease who presents to the emergency department with increasing shortness of breath and dependent edema the patient is talking fluently on BiPAP reporting shortness of breath worsening since yesterday she reports increased sputum production and slightly increased cough denies any fever or chills she also  reports approximately 2 months of increasing dependent edema she does endorse orthopnea but no paroxysmal nocturnal dyspnea.  She denies any recent medication changes reports compliance to her medications including her chronic prednisone  5 mg daily.  The patient's family member Fleeta at the bedside and assists confirming the history.  The patient denies any chest pain or any palpitations. DG Chest 7/19:  Pulmonary hyperinflation with mild bibasilar interstitial  prominence.  2. Possible small pleural effusions. Type of Study: Bedside Swallow Evaluation Previous Swallow Assessment: none in chart Diet Prior to this Study: Dysphagia 1 (pureed);Thin liquids (Level 0) Temperature Spikes Noted: No Respiratory Status: Nasal cannula (6L) History of Recent Intubation: No Behavior/Cognition: Alert;Cooperative;Impulsive Oral Cavity Assessment: Within Functional Limits Oral Care Completed by SLP: Recent completion by staff Oral Cavity - Dentition: Edentulous (dentures in room- pt refused to place) Vision:  (n/a (granddaughter feeding pt per pt request)) Self-Feeding Abilities: Total assist (per pt request) Patient Positioning: Upright in bed Baseline Vocal Quality: Normal Volitional Cough: Congested Volitional Swallow: Able to elicit    Oral/Motor/Sensory Function Overall Oral Motor/Sensory Function: Within functional limits   Ice Chips Ice chips: Not tested   Thin Liquid Thin Liquid: Within functional limits Presentation: Cup    Nectar Thick Nectar Thick Liquid: Not tested   Honey Thick Honey Thick Liquid: Not tested   Puree Puree: Within functional limits Presentation: Spoon   Solid     Solid: Within  functional limits Presentation: Spoon Other Comments: chopped consistent     [place] Tavious Griesinger Clapp, MS, CCC-SLP Speech Language Pathologist Rehab Services; [facility] [place] [phone] (ascom)   [place] J Clapp 10/17/2023,10:59 AM

## 2023-10-17 NOTE — Progress Notes (Signed)
 PULMONOLOGY         Date: 10/17/2023,   MRN# [id number] Monica Stewart 11-04-1945     AdmissionWeight: 50.9 kg                 CurrentWeight: 50.9 kg  Referring provider: Dr Core   CHIEF COMPLAINT:    Acute on chronic hypoxemic and hypercapnic respiratory failure  HISTORY OF PRESENT ILLNESS   This is a 78 yo F with stage 4 COPD and chronic hypoxemia on 3L/min [name] and recurrent hypercapnic encephalopathy.  Daughter reports courses of confusion at home even at rest. She is weak with deconditioning with baseline mMRC at 4.  She also has advanced Systolic CHF with EF <35% And a list of additional comorbidities. She came in to ER with worsening respiratory distress.  She has been compliant with combination ICS/LABA/LAMA at home with Trelegy once dialy. She also takes chronic prednisone  5mg  and Daliresp . She required BIPAP on arrival and is in respiratory distress. ABG with severe hypercapnic hypoxemic resp failure.  PCCM consultation for further evaluation and management.   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 She is with BODE score > 8 and overall prognosis which carries high risk of death including <20% chance of long term survival.  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   CARDIAC CATHETERIZATION     CATARACT EXTRACTION W/PHACO Right 01/23/2015   Procedure: CATARACT EXTRACTION PHACO AND INTRAOCULAR LENS PLACEMENT (IOC);  Surgeon: Newell Ovens, MD;  Location: [facility] ORS;  Service: Ophthalmology;  Laterality: Right;  US             1.09 AP              17.7 CDE         12.16 casette lot # [id number]   EYE SURGERY     TUBAL LIGATION       FAMILY HISTORY   Family History  Problem Relation Age of Onset   Breast cancer Sister 57   Breast cancer Paternal Aunt      SOCIAL HISTORY   Social History   Tobacco Use   Smoking status: Former    Current packs/day: 0.00    Types: Cigarettes    Quit date: 01/22/1989    Years since quitting: 34.7   Smokeless tobacco: Never  Substance Use Topics   Alcohol  use: No   Drug use: No     MEDICATIONS    Home Medication:    Current Medication:  Current Facility-Administered Medications:    acetaminophen  (TYLENOL ) tablet 650 mg, 650 mg, Oral, Q6H PRN **OR** acetaminophen  (TYLENOL ) suppository 650 mg, 650 mg, Rectal, Q6H PRN, Core, Prentice BROCKS, MD   albuterol  (PROVENTIL ) (2.5 MG/3ML) 0.083% nebulizer solution 2.5 mg, 2.5 mg, Nebulization, Q2H PRN, Core, Prentice BROCKS, MD, 2.5 mg at 10/16/23 1106   calcium -vitamin D  (OSCAL WITH D) 500-5 MG-MCG per tablet 1 tablet, 1 tablet, Oral, Daily, Core, Prentice BROCKS, MD   Chlorhexidine  Gluconate Cloth 2 % PADS 6 each,  6 each, Topical, Daily, Tobie Calix, MD, 6 each at 10/16/23 1736   diazepam  (VALIUM ) injection 2.5 mg, 2.5 mg, Intravenous, Q6H PRN, Patel, Sona, MD, 2.5 mg at 10/16/23 2110   enoxaparin  (LOVENOX ) injection 55 mg, 1 mg/kg, Subcutaneous, BID, Core, Prentice BROCKS, MD, 55 mg at 10/16/23 2110   fluticasone  (FLONASE ) 50 MCG/ACT nasal spray 2 spray, 2 spray, Each Nare, Daily, Core, Prentice BROCKS, MD   furosemide  (LASIX ) injection 40 mg, 40 mg, Intravenous, Q12H, Core, Prentice BROCKS, MD, 40 mg at 10/17/23 0016   insulin  aspart (novoLOG ) injection 0-5 Units, 0-5 Units, Subcutaneous, QHS, Patel, Sona, MD   insulin  aspart (novoLOG ) injection 0-9 Units, 0-9 Units, Subcutaneous, TID WC, Patel, Sona, MD, 1 Units at 10/16/23 1736   levalbuterol  (XOPENEX ) nebulizer solution 0.63 mg, 0.63 mg, Nebulization, Q6H, Patel, Sona, MD, 0.63 mg at 10/17/23 9270   losartan  (COZAAR ) tablet  100 mg, 100 mg, Oral, Daily, Core, Prentice BROCKS, MD   multivitamin with minerals tablet 1 tablet, 1 tablet, Oral, Daily, Core, Prentice BROCKS, MD   ondansetron  (ZOFRAN ) tablet 4 mg, 4 mg, Oral, Q6H PRN **OR** ondansetron  (ZOFRAN ) injection 4 mg, 4 mg, Intravenous, Q6H PRN, Core, Prentice BROCKS, MD   polyethylene glycol (MIRALAX  / GLYCOLAX ) packet 17 g, 17 g, Oral, Daily PRN, Core, Prentice BROCKS, MD   [COMPLETED] methylPREDNISolone  sodium succinate (SOLU-MEDROL ) 40 mg/mL injection 40 mg, 40 mg, Intravenous, Q12H, 40 mg at 10/16/23 2110 **FOLLOWED BY** predniSONE  (DELTASONE ) tablet 40 mg, 40 mg, Oral, Q breakfast, Tobie Calix, MD   umeclidinium-vilanterol (ANORO ELLIPTA ) 62.5-25 MCG/ACT 1 puff, 1 puff, Inhalation, Daily, Core, Prentice BROCKS, MD, 1 puff at 10/16/23 1457    ALLERGIES   Lisinopril-hydrochlorothiazide      REVIEW OF SYSTEMS    Review of Systems:  Gen:  Denies  fever, sweats, chills weigh loss  HEENT: Denies blurred vision, double vision, ear pain, eye pain, hearing loss, nose bleeds, sore throat Cardiac:  No dizziness, chest pain or heaviness, chest tightness,edema Resp:   reports dyspnea chronically  Gi: Denies swallowing difficulty, stomach pain, nausea or vomiting, diarrhea, constipation, bowel incontinence Gu:  Denies bladder incontinence, burning urine Ext:   Denies Joint pain, stiffness or swelling Skin: Denies  skin rash, easy bruising or bleeding or hives Endoc:  Denies polyuria, polydipsia , polyphagia or weight change Psych:   Denies depression, insomnia or hallucinations   Other:  All other systems negative   VS: BP (!) 145/88 (BP Location: Right Arm)   Pulse 100   Temp (!) 97.3 F (36.3 C) (Axillary)   Resp 17   Ht 5' 4 (1.626 m)   Wt 50.9 kg   SpO2 99%   BMI 19.26 kg/m      PHYSICAL EXAM    GENERAL:NAD, no fevers, chills, no weakness no fatigue HEAD: Normocephalic, atraumatic.  EYES: Pupils equal, round, reactive to light. Extraocular muscles intact. No scleral  icterus.  MOUTH: Moist mucosal membrane. Dentition intact. No abscess noted.  EAR, NOSE, THROAT: Clear without exudates. No external lesions.  NECK: Supple. No thyromegaly. No nodules. No JVD.  PULMONARY: decreased breath sounds with mild rhonchi worse at bases bilaterally.  CARDIOVASCULAR: S1 and S2. Regular rate and rhythm. No murmurs, rubs, or gallops. No edema. Pedal pulses 2+ bilaterally.  GASTROINTESTINAL: Soft, nontender, nondistended. No masses. Positive bowel sounds. No hepatosplenomegaly.  MUSCULOSKELETAL: No swelling, clubbing, or edema. Range of motion full in all extremities.  NEUROLOGIC: Cranial nerves II through XII are intact. No gross focal neurological deficits. Sensation intact.  Reflexes intact.  SKIN: No ulceration, lesions, rashes, or cyanosis. Skin warm and dry. Turgor intact.  PSYCHIATRIC: Mood, affect within normal limits. The patient is awake, alert and oriented x 3. Insight, judgment intact.       IMAGING   CLINICAL DATA: COPD exacerbation  EXAM: PORTABLE CHEST - 1 VIEW  COMPARISON: 02/23/2020  FINDINGS: Pulmonary hyperinflation with attenuated bronchovascular markings in both upper lobes. No focal airspace disease. Mild interstitial prominence in the lung bases, increased from previous.  Heart size and mediastinal contours are within normal limits. Aortic Atherosclerosis (ICD10-170.0).  Mild blunting of the lateral costophrenic angles.  Visualized bones unremarkable.  IMPRESSION: 1. Pulmonary hyperinflation with mild bibasilar interstitial prominence. 2. Possible small pleural effusions.   Electronically Signed By: JONETTA Faes M.D. On: 10/15/2023 21:15   ASSESSMENT/PLAN   Acute on chronic hypoxemic hypercapnic respiratory failure    -due to Severe Acute COPD exacerbation    -possible culprit is viral vs bacterial infection     - CRP trend    - procalcitonin trend    - respiratory culture    - resp viral panel    - COVID/FLU/RSV  negative    - continue COPD care path with steroids, nebs, abx    - IS as able , flutter as able     - repeat CXR in 48hr    -legionella and strep pneumoniae testing    -BODE score >8 with <20% long term survival    - palliative care consultation for possible comfort care / hospice evaluation     Hypercapnic respiratory failure with Encephalopathy    - continue BIPAP     - repeat VBG in 6 hr   Bibasilar atelelctasis      Chest physiotherapy      - continue BIPAP      - PT/OT     Advanced Systolic CHF      - continue diuresis prn      - trans thoracic echo with both left and right heart dysfunction - ddimer to rule out PE      - LVEF <35%      Thank you for allowing me to participate in the care of this patient.   Patient/Family are satisfied with care plan and all questions have been answered.    Provider disclosure: Patient with at least one acute or chronic illness or injury that poses a threat to life or bodily function and is being managed actively during this encounter.  All of the below services have been performed independently by signing provider:  review of prior documentation from internal and or external health records.  Review of previous and current lab results.  Interview and comprehensive assessment during patient visit today. Review of current and previous chest radiographs/CT scans. Discussion of management and test interpretation with health care team and patient/family.   This document was prepared using Dragon voice recognition software and may include unintentional dictation errors.     Sunshyne Horvath, M.D.  Division of Pulmonary & Critical Care Medicine

## 2023-10-17 NOTE — Progress Notes (Addendum)
 Daily Progress Note   Patient Name: Monica Stewart       Date: 10/17/2023 DOB: 08-22-1945  Age: 78 y.o. MRN#: [id number] Attending Physician: Tobie Calix, MD Primary Care Physician: Rudolpho Norleen BIRCH, MD Admit Date: 10/15/2023  Reason for Consultation/Follow-up: Establishing goals of care  Subjective: Notes and labs reviewed.  Spoke with attending.  In to see patient.  She is currently sitting upright in bed with BiPAP in place, grand daughter at bedside.   She discusses her status and updates that she has been provided.    She discusses that she had previously decided on a DNR/DNI status.  She states she prayed about this decision and feels that God still has work for her to do.  She states if it is her time to go, that she will go no matter what interventions are done.    She discusses family dynamics.  She states there are family members here because she is on the hospital, and she is hopeful for them to be able to spend some time together as she would like to see some deep rifts healed within the family.  She states that there are family members that she needs to talk to on an emotional and a spiritual level.  She states she wants to be sure where her loved ones will go when they die.  She states after she has had these conversations she will be ready to go.  She states she is a woman of deep Christian faith.  We discussed mortality and God's sovereignty.  She states she will continue to pray about these things.  Granddaughter states she just wants her grandmother to be comfortable.  Discussed her BiPAP use and concern for limited prognosis.  She states she will continue to spend precious time with her family, and will further  consider CODE STATUS or limits to care.  Length of Stay:  1  Current Medications: Scheduled Meds:   apixaban   5 mg Oral BID   calcium -vitamin D   1 tablet Oral Daily   Chlorhexidine  Gluconate Cloth  6 each Topical Daily   dapagliflozin  propanediol  10 mg Oral Daily   fluticasone   2 spray Each Nare Daily   furosemide   40 mg Intravenous Q12H   insulin  aspart  0-5 Units Subcutaneous QHS   insulin  aspart  0-9 Units Subcutaneous TID WC   levalbuterol   0.63 mg Nebulization Q6H   losartan   100 mg Oral Daily   metoprolol  tartrate  25 mg Oral BID   multivitamin with minerals  1 tablet Oral Daily   predniSONE   40 mg Oral Q breakfast   spironolactone   25 mg Oral Daily   umeclidinium-vilanterol  1 puff Inhalation Daily    Continuous Infusions:   PRN Meds: acetaminophen  **OR** acetaminophen , albuterol , diazepam , ondansetron  **OR** ondansetron  (ZOFRAN ) IV, polyethylene glycol  Physical Exam Constitutional:      Comments: Thin and frail  Pulmonary:     Comments: On BiPAP.  Work of breathing noted. Skin:    General: Skin is warm and dry.  Neurological:     Mental Status: She is alert.             Vital Signs: BP 102/79   Pulse 99   Temp 98 F (36.7 C)   Resp 20   Ht 5' 4 (1.626 m)   Wt 50.9 kg   SpO2 100%   BMI 19.26 kg/m  SpO2: SpO2: 100 % O2 Device: O2 Device: Bi-PAP O2 Flow Rate: O2 Flow Rate (L/min): 3 L/min  Intake/output summary:  Intake/Output Summary (Last 24 hours) at 10/17/2023 1523 Last data filed at 10/17/2023 0409 Gross per 24 hour  Intake 80 ml  Output 1860 ml  Net -1780 ml   LBM: Last BM Date : 10/16/23 Baseline Weight: Weight: 50.9 kg Most recent weight: Weight: 50.9 kg    Patient Active Problem List   Diagnosis Date Noted   Acute respiratory failure (HCC) 10/16/2023   Acute pulmonary embolism (HCC) 10/16/2023   DM (diabetes mellitus), type 2 (HCC) 10/16/2023   Acute on chronic diastolic CHF (congestive heart failure) (HCC) 10/16/2023   Non-ischemic cardiomyopathy (HCC) 10/16/2023   Sinus tachycardia  10/16/2023   Transaminitis 10/16/2023   HTN (hypertension) 10/16/2023   HLD (hyperlipidemia) 10/16/2023   Osteoporosis 10/16/2023   Acute clinical systolic heart failure (HCC) 10/16/2023   Palliative care encounter 10/16/2023   COPD with acute exacerbation (HCC) 02/23/2020    Palliative Care Assessment & Plan     Recommendations/Plan: PMT will follow  Code Status:    Code Status Orders  (From admission, onward)           Start     Ordered   10/16/23 2027  Full code  (Code Status)  Continuous       Question:  By:  Answer:  Consent: discussion documented in EHR   10/16/23 2026           Code Status History     Date Active Date Inactive Code Status Order ID Comments User Context   10/16/2023 0000 10/16/2023 2026 Limited: Do not attempt resuscitation (DNR) -DNR-LIMITED -Do Not Intubate/DNI  [id number]  Core, Prentice BROCKS, MD ED   02/23/2020 1410 02/25/2020 1956 Full Code [id number]  Acheampong, Maude POUR, MD ED      Thank you for allowing the Palliative Medicine Team to assist in the care of this patient.     Camelia Lewis, NP  Please contact Palliative Medicine Team phone at [phone] for questions and concerns.

## 2023-10-18 ENCOUNTER — Other Ambulatory Visit (HOSPITAL_COMMUNITY): Payer: Self-pay

## 2023-10-18 ENCOUNTER — Encounter

## 2023-10-18 ENCOUNTER — Telehealth (HOSPITAL_COMMUNITY): Payer: Self-pay | Admitting: Pharmacy Technician

## 2023-10-18 DIAGNOSIS — J441 Chronic obstructive pulmonary disease with (acute) exacerbation: Secondary | ICD-10-CM | POA: Diagnosis not present

## 2023-10-18 DIAGNOSIS — J9601 Acute respiratory failure with hypoxia: Secondary | ICD-10-CM | POA: Diagnosis not present

## 2023-10-18 DIAGNOSIS — Z7189 Other specified counseling: Secondary | ICD-10-CM | POA: Diagnosis not present

## 2023-10-18 DIAGNOSIS — I5021 Acute systolic (congestive) heart failure: Secondary | ICD-10-CM | POA: Diagnosis not present

## 2023-10-18 DIAGNOSIS — I428 Other cardiomyopathies: Secondary | ICD-10-CM

## 2023-10-18 LAB — BLOOD GAS, ARTERIAL
Acid-Base Excess: 21.2 mmol/L — ABNORMAL HIGH (ref 0.0–2.0)
Bicarbonate: 50.7 mmol/L — ABNORMAL HIGH (ref 20.0–28.0)
O2 Content: 4 L/min
O2 Saturation: 96.1 %
Patient temperature: 37
pCO2 arterial: 80 mmHg (ref 32–48)
pH, Arterial: 7.41 (ref 7.35–7.45)
pO2, Arterial: 76 mmHg — ABNORMAL LOW (ref 83–108)

## 2023-10-18 LAB — BASIC METABOLIC PANEL WITH GFR
Anion gap: 13 (ref 5–15)
BUN: 43 mg/dL — ABNORMAL HIGH (ref 8–23)
CO2: 37 mmol/L — ABNORMAL HIGH (ref 22–32)
Calcium: 9.5 mg/dL (ref 8.9–10.3)
Chloride: 91 mmol/L — ABNORMAL LOW (ref 98–111)
Creatinine, Ser: 0.91 mg/dL (ref 0.44–1.00)
GFR, Estimated: >60 mL/min (ref >60)
Glucose, Bld: 103 mg/dL — ABNORMAL HIGH (ref 70–99)
Potassium: 4.2 mmol/L (ref 3.5–5.1)
Sodium: 141 mmol/L (ref 135–145)

## 2023-10-18 LAB — CBC
HCT: 35.4 % — ABNORMAL LOW (ref 36.0–46.0)
Hemoglobin: 11.4 g/dL — ABNORMAL LOW (ref 12.0–15.0)
MCH: 29.4 pg (ref 26.0–34.0)
MCHC: 32.2 g/dL (ref 30.0–36.0)
MCV: 91.2 fL (ref 80.0–100.0)
Platelets: 201 K/uL (ref 150–400)
RBC: 3.88 MIL/uL (ref 3.87–5.11)
RDW: 14.5 % (ref 11.5–15.5)
WBC: 10.9 K/uL — ABNORMAL HIGH (ref 4.0–10.5)
nRBC: 0 % (ref 0.0–0.2)

## 2023-10-18 LAB — MAGNESIUM: Magnesium: 2.4 mg/dL (ref 1.7–2.4)

## 2023-10-18 LAB — GLUCOSE, CAPILLARY
Glucose-Capillary: 112 mg/dL — ABNORMAL HIGH (ref 70–99)
Glucose-Capillary: 152 mg/dL — ABNORMAL HIGH (ref 70–99)
Glucose-Capillary: 191 mg/dL — ABNORMAL HIGH (ref 70–99)
Glucose-Capillary: 136 mg/dL — ABNORMAL HIGH (ref 70–99)

## 2023-10-18 LAB — PROCALCITONIN: Procalcitonin: 0.17 ng/mL

## 2023-10-18 LAB — PHOSPHORUS: Phosphorus: 4.5 mg/dL (ref 2.5–4.6)

## 2023-10-18 MED ORDER — ALPRAZOLAM 0.25 MG PO TABS
0.2500 mg | ORAL_TABLET | Freq: Three times a day (TID) | ORAL | Status: DC | PRN
  Administered 2023-10-18 – 2023-10-24 (×6): 0.25 mg via ORAL
  Filled 2023-10-18 (×7): qty 1

## 2023-10-18 NOTE — Evaluation (Signed)
 Occupational Therapy Evaluation Patient Details Name: Monica Stewart MRN: [id number] DOB: Apr 24, 1945 Today's Date: 10/18/2023   History of Present Illness   Monica Stewart is a 78 y.o. female  with a past medical history of hypertension, hyperlipidemia, chronic respiratory failure (3L), COPD, pulmonary nodule and patient reports hx of atrial flutter (years ago) who presented to the ED on 10/15/2023 for worsening fatigue, lower extremity swelling, fatigue for months. Admitted for mgmt of COPD and acute on chronic respiratory failure     Clinical Impressions Pt seen for OT/PT co-evaluation, RN cleared for participation in therapy and in room to remove BiPAP start of session. Supportive granddaughter present throughout. Prior to hospital admission, pt lives with family who are available intermittently and cannot provide needed support at discharge. She is able to complete ADLs mod independent and has a 4WW but does not often use. VSS throughout, pt on 4L via [place]. Increased work of breathing noted and pt required multiple rest breaks but is motivated to get well. Requires CGA +2 for safety for STS transfers, takes a few small sidesteps and takes a seated rest break. Anticipate pt will require up to MAX A for LB ADLs and up to MIN A for UB ADLs. Pt would benefit from skilled OT services to address noted impairments and functional limitations (see below for any additional details) in order to maximize safety and independence while minimizing falls risk and caregiver burden. Anticipate the need for follow up OT services upon acute hospital DC. Patient will benefit from continued inpatient follow up therapy, <3 hours/day      If plan is discharge home, recommend the following:   A lot of help with walking and/or transfers;A lot of help with bathing/dressing/bathroom;Assistance with cooking/housework;Direct supervision/assist for medications management;Direct supervision/assist for financial management;Help  with stairs or ramp for entrance;Assist for transportation     Functional Status Assessment   Patient has had a recent decline in their functional status and demonstrates the ability to make significant improvements in function in a reasonable and predictable amount of time.     Equipment Recommendations   BSC/3in1      Precautions/Restrictions   Precautions Precautions: Fall Recall of Precautions/Restrictions: Intact Restrictions Weight Bearing Restrictions Per Provider Order: No     Mobility Bed Mobility Overal bed mobility: Needs Assistance Bed Mobility: Supine to Sit, Sit to Supine     Supine to sit: Contact guard Sit to supine: Contact guard assist   General bed mobility comments: CGA, increased time and effort    Transfers Overall transfer level: Needs assistance Equipment used: Rolling walker (2 wheels) Transfers: Sit to/from Stand Sit to Stand: Contact guard assist, +2 safety/equipment           General transfer comment: CGA x2 for STS, pt requiring increased time to complete with RW, no LOB but clearly effortful for pt, states she feels weak in bilat LE      Balance Overall balance assessment: Needs assistance Sitting-balance support: Single extremity supported, Feet supported Sitting balance-Leahy Scale: Fair Sitting balance - Comments: sititng EOB   Standing balance support: Bilateral upper extremity supported Standing balance-Leahy Scale: Fair Standing balance comment: standing using RW, fatigues quickly with unsteady BLEs                           ADL either performed or assessed with clinical judgement   ADL Overall ADL's : Needs assistance/impaired  Toilet Transfer: Librarian, academic for physical assistance;+2 for safety/equipment;Stand-pivot;BSC/3in1 Toilet Transfer Details (indicate cue type and reason): anticipate         Functional mobility during ADLs: Minimal assistance;+2  for safety/equipment;Rolling walker (2 wheels) General ADL Comments: anticipate up to MAX A for LB ADLs, up to MIN A for UB ADLs     Vision Baseline Vision/History: 1 Wears glasses Ability to See in Adequate Light: 0 Adequate              Pertinent Vitals/Pain Pain Assessment Pain Assessment: No/denies pain     Extremity/Trunk Assessment Upper Extremity Assessment Upper Extremity Assessment: Generalized weakness   Lower Extremity Assessment Lower Extremity Assessment: Generalized weakness   Cervical / Trunk Assessment Cervical / Trunk Assessment: Kyphotic   Communication Communication Communication: No apparent difficulties   Cognition Arousal: Alert Behavior During Therapy: WFL for tasks assessed/performed Cognition: No apparent impairments                               Following commands: Intact       Cueing  General Comments   Cueing Techniques: Verbal cues;Tactile cues  VSS on 4L via [place]. Increased work of breathing noted throughout. HR up to 114 with mobility.           Home Living Family/patient expects to be discharged to:: Private residence Living Arrangements: Children Available Help at Discharge: Family;Available PRN/intermittently Type of Home: House       Home Layout: One level     Bathroom Shower/Tub: Chief Strategy Officer: Standard Bathroom Accessibility: No   Home Equipment: Rollator (4 wheels);Cane - single point   Additional Comments: has rollator but per granddaughter pt does not use it, pt states she does use SPC intermittegly      Prior Functioning/Environment Prior Level of Function : Independent/Modified Independent             Mobility Comments: SPC/rollator vs no AD ADLs Comments: states she is ind but just needs increased time    OT Problem List: Decreased strength;Impaired balance (sitting and/or standing);Decreased knowledge of precautions;Decreased range of motion;Cardiopulmonary  status limiting activity;Decreased activity tolerance;Decreased coordination;Decreased knowledge of use of DME or AE   OT Treatment/Interventions: Self-care/ADL training;DME and/or AE instruction;Therapeutic activities;Therapeutic exercise;Energy conservation;Patient/family education      OT Goals(Current goals can be found in the care plan section)   Acute Rehab OT Goals OT Goal Formulation: With patient Time For Goal Achievement: 11/01/23 Potential to Achieve Goals: Fair   OT Frequency:  Min 2X/week    Co-evaluation PT/OT/SLP Co-Evaluation/Treatment: Yes Reason for Co-Treatment: To address functional/ADL transfers PT goals addressed during session: Mobility/safety with mobility;Strengthening/ROM;Proper use of DME;Balance OT goals addressed during session: ADL's and self-care      AM-PAC OT 6 Clicks Daily Activity     Outcome Measure Help from another person eating meals?: A Little Help from another person taking care of personal grooming?: A Little Help from another person toileting, which includes using toliet, bedpan, or urinal?: A Little Help from another person bathing (including washing, rinsing, drying)?: A Lot Help from another person to put on and taking off regular upper body clothing?: A Little Help from another person to put on and taking off regular lower body clothing?: A Lot 6 Click Score: 16   End of Session Equipment Utilized During Treatment: Rolling walker (2 wheels);Oxygen  Nurse Communication: Mobility status  Activity Tolerance: Patient tolerated treatment well Patient  left: in bed;with call bell/phone within reach;with bed alarm set;with family/visitor present  OT Visit Diagnosis: Unsteadiness on feet (R26.81);Other abnormalities of gait and mobility (R26.89);Muscle weakness (generalized) (M62.81)                Time: 8597-8574 OT Time Calculation (min): 23 min Charges:  OT General Charges $OT Visit: 1 Visit OT Evaluation $OT Eval Moderate  Complexity: 1 Mod  Wilmont Olund L. Salvatore Shear, OTR/L  10/18/23, 5:24 PM

## 2023-10-18 NOTE — Plan of Care (Signed)

## 2023-10-18 NOTE — TOC Progression Note (Signed)
 Transition of Care [facility]) - Progression Note    Patient Details  Name: Monica Stewart MRN: [id number] Date of Birth: 09/13/1945  [facility] CM/SW Contact  Tomasa JAYSON Childes, RN Phone Number: 10/18/2023, 3:50 PM  Clinical Narrative:    Per nurse family has question regarding home care. Spoke with patient's grandaughter, [name]  at the bedside regarding discharge plans. Temalya stated the family initially intended to take the patient home. She inquired about home care. RNCM educated about home care vs Home Health. Temalya was advised HH could be arranged. Temalya inquired about how to search for home care. She was advised information could be could be found on the internet. She was also advised therapy had been consulted to see patient and the recommendation could may be different than HH. RNCM advised patient may likely need a BIPAP. Temalya stated family hoped to take patient back home and provide care.                     Expected Discharge Plan and Services                                               Social Drivers of Health (SDOH) Interventions SDOH Screenings   Food Insecurity: Food Insecurity Present (06/27/2023)   Received from [facility]  Housing: Low Risk  (06/27/2023)   Received from [facility]  Transportation Needs: No Transportation Needs (06/27/2023)   Received from [facility]  Utilities: Not At Risk (06/27/2023)   Received from [facility]  Financial Resource Strain: Low Risk  (06/27/2023)   Received from [facility]  Tobacco Use: Medium Risk (10/15/2023)    Readmission Risk Interventions     No data to display

## 2023-10-18 NOTE — Progress Notes (Signed)
 Daily Progress Note   Patient Name: Monica Stewart       Date: 10/18/2023 DOB: July 02, 1945  Age: 78 y.o. MRN#: [id number] Attending Physician: Tobie Calix, MD Primary Care Physician: Rudolpho Norleen BIRCH, MD Admit Date: 10/15/2023  Reason for Consultation/Follow-up: Establishing goals of care  Subjective: Notes and labs reviewed.  In to see patient.  She is off BiPAP and on the nasal cannula at around 3 L.  She is eating her lunch with granddaughter at bedside.  She states she is feeling better today.  She discusses that she is a woman of great faith and God still has work for her to do.  She states she would like to go home at the end of this hospitalization.  She states she wants any and all care possible to keep her alive until she is finished with what God has plan for her and then she will be ready to die.  Granddaughter inquires to discuss being things that she has left to do and patient states there are people she needs to talk with and things that are personal to her that she needs to do.  She states she has not been able to attend church since November and would like to go to church 1 last time.  Patient states she would want her children to be surrogate decision maker as they are here in the area, and is certain that they would make the right decisions on her behalf.  She states in an emergency if they are not readily available that she would want her granddaughter to be her surrogate decision maker.  Length of Stay: 2  Current Medications: Scheduled Meds:   apixaban   5 mg Oral BID   calcium -vitamin D   1 tablet Oral Daily   Chlorhexidine  Gluconate Cloth  6 each Topical Daily   dapagliflozin  propanediol  10 mg Oral Daily   feeding supplement  237 mL Oral BID BM   fluticasone   2 spray  Each Nare Daily   furosemide   40 mg Intravenous Q12H   insulin  aspart  0-5 Units Subcutaneous QHS   insulin  aspart  0-9 Units Subcutaneous TID WC   levalbuterol   0.63 mg Nebulization Q6H   losartan   100 mg Oral Daily   metoprolol  tartrate  25 mg Oral BID   multivitamin with minerals  1 tablet Oral Daily   predniSONE   40 mg Oral Q breakfast   spironolactone   25 mg Oral Daily   umeclidinium-vilanterol  1 puff Inhalation Daily    Continuous Infusions:   PRN Meds: acetaminophen  **OR** acetaminophen , albuterol , ALPRAZolam , ondansetron  **OR** ondansetron  (ZOFRAN ) IV, polyethylene glycol  Physical Exam Pulmonary:     Effort: Pulmonary effort is normal.  Skin:    General: Skin is warm and dry.  Neurological:     Mental Status: She is alert.             Vital Signs: BP 98/67 (BP Location: Right Arm)   Pulse 79   Temp 98.7 F (37.1 C)   Resp 19   Ht 5' 4 (1.626 m)   Wt 51.7 kg   SpO2 100%   BMI 19.56 kg/m  SpO2: SpO2: 100 % O2 Device: O2 Device: Nasal Cannula O2 Flow Rate: O2 Flow Rate (L/min): (S) 3 L/min  Intake/output summary:  Intake/Output Summary (Last 24 hours) at 10/18/2023 1526 Last data filed at 10/18/2023 1400 Gross per 24 hour  Intake 120 ml  Output 3550 ml  Net -3430 ml   LBM: Last BM Date : 10/16/19 Baseline Weight: Weight: 50.9 kg Most recent weight: Weight: 51.7 kg          Patient Active Problem List   Diagnosis Date Noted   Acute respiratory failure (HCC) 10/16/2023   Acute pulmonary embolism (HCC) 10/16/2023   DM (diabetes mellitus), type 2 (HCC) 10/16/2023   Acute on chronic diastolic CHF (congestive heart failure) (HCC) 10/16/2023   Non-ischemic cardiomyopathy (HCC) 10/16/2023   Sinus tachycardia 10/16/2023   Transaminitis 10/16/2023   HTN (hypertension) 10/16/2023   HLD (hyperlipidemia) 10/16/2023   Osteoporosis 10/16/2023   Acute clinical systolic heart failure (HCC) 10/16/2023   Palliative care encounter 10/16/2023   COPD with  acute exacerbation (HCC) 02/23/2020    Palliative Care Assessment & Plan    Recommendations/Plan: Continue full code full scope   Code Status:    Code Status Orders  (From admission, onward)           Start     Ordered   10/16/23 2027  Full code  (Code Status)  Continuous       Question:  By:  Answer:  Consent: discussion documented in EHR   10/16/23 2026           Code Status History     Date Active Date Inactive Code Status Order ID Comments User Context   10/16/2023 0000 10/16/2023 2026 Limited: Do not attempt resuscitation (DNR) -DNR-LIMITED -Do Not Intubate/DNI  [id number]  Core, Prentice BROCKS, MD ED   02/23/2020 1410 02/25/2020 1956 Full Code [id number]  Acheampong, Maude POUR, MD ED       Thank you for allowing the Palliative Medicine Team to assist in the care of this patient.    Camelia Lewis, NP  Please contact Palliative Medicine Team phone at [phone] for questions and concerns.

## 2023-10-18 NOTE — Telephone Encounter (Signed)
 Pharmacy Patient Advocate Encounter  Insurance verification completed.    The patient is insured through [employer].     Ran test claim for Entresto  24-26mg  tablets and the current 30 day co-pay is $0.00.   This test claim was processed through [facility]- copay amounts may vary at other pharmacies due to pharmacy/plan contracts, or as the patient moves through the different stages of their insurance plan.

## 2023-10-18 NOTE — Progress Notes (Signed)
 PT Cancellation Note  Patient Details Name: Monica Stewart MRN: [id number] DOB: [birth year]   Cancelled Treatment:    Reason Eval/Treat Not Completed: Other (comment) Chart reviewed, pt still on BiPAP, will re-assess when appropriate.    Julianna Vanwagner Romero-Perozo, SPT  10/18/2023, 8:46 AM

## 2023-10-18 NOTE — Progress Notes (Signed)
 Heart Failure Navigator Progress Note  Assessed for Heart & Vascular TOC clinic readiness.  Patient does not meet criteria due to [facility] Patient.   Navigator will sign off at this time.  Roxy Horseman, RN, BSN [facility] Heart Failure Navigator Secure Chat Only

## 2023-10-18 NOTE — Evaluation (Signed)
 Physical Therapy Evaluation Patient Details Name: Monica Stewart MRN: [id number] DOB: [birth year] Today's Date: 10/18/2023  History of Present Illness  78 year old female with a past medical history of chronic respiratory failure on 3 L home oxygen , chronic obstructive pulmonary disorder FEV1 of 0.6 L, pulmonary nodule, gastroesophageal reflux disease who presents to the emergency department with increasing shortness of breath and dependent edema the patient is talking fluently on BiPAP reporting shortness of breath worsening since yesterday she reports increased sputum production and slightly increased cough denies any fever or chills she also reports approximately 2 months of increasing dependent edema she does endorse orthopnea but no paroxysmal nocturnal dyspnea.  Clinical Impression  Co-eval with OT performed this date. Pt is a pleasant 78 year old female who was admitted for acute respiratory failure. Pt performs bed mobility with increased time and requires rest breaks in between movements, SpO2 and HR monitored throughout and remained stable although pt demo effortful breathing. STS transfer performed with CGA and use of RW, again pt demo effortful breathing and fatiguing easily, requiring standing rest break, vitals remaining stable. Ambulation x5' towards [facility] with RW successful, pt stating she felt weak especially in her bilat LE, no LOB experienced. Pt demonstrates deficits with balance, strength, and activity tolerance that are currently impacting her QoL. Due to the lack of constant support at home, pt would benefit from a short stay at a rehab facility to best meet her current needs. PT to follow acutely as appropriate.          If plan is discharge home, recommend the following: A lot of help with walking and/or transfers;A lot of help with bathing/dressing/bathroom;Assist for transportation;Help with stairs or ramp for entrance   Can travel by private vehicle   Yes    Equipment  Recommendations None recommended by PT  Recommendations for Other Services       Functional Status Assessment Patient has had a recent decline in their functional status and demonstrates the ability to make significant improvements in function in a reasonable and predictable amount of time.     Precautions / Restrictions Precautions Precautions: Fall Recall of Precautions/Restrictions: Intact Restrictions Weight Bearing Restrictions Per Provider Order: No      Mobility  Bed Mobility Overal bed mobility: Needs Assistance Bed Mobility: Supine to Sit, Sit to Supine     Supine to sit: Contact guard Sit to supine: Contact guard assist   General bed mobility comments: CGA for bed mobility, pt requiring increased time and effort to complete, effortful breathing although vitals remaining stable 96%< on 4L [place]    Transfers Overall transfer level: Needs assistance Equipment used: Rolling walker (2 wheels) Transfers: Sit to/from Stand Sit to Stand: Contact guard assist, +2 safety/equipment           General transfer comment: CGA x2 for STS, pt requiring increased time to complete with RW, no LOB but clearly effortful for pt, states she feels weak in bilat LE    Ambulation/Gait Ambulation/Gait assistance: Contact guard assist Gait Distance (Feet): 5 Feet Assistive device: Rolling walker (2 wheels) Gait Pattern/deviations: Step-to pattern, Decreased step length - right, Decreased step length - left       General Gait Details: step to pattern with RW towards HOB, no LOB, effortful and pt easily fatigues  Stairs            Wheelchair Mobility     Tilt Bed    Modified Rankin (Stroke Patients Only)  Balance Overall balance assessment: Needs assistance Sitting-balance support: Single extremity supported, Feet supported Sitting balance-Leahy Scale: Fair Sitting balance - Comments: sititng EOB   Standing balance support: Bilateral upper extremity  supported Standing balance-Leahy Scale: Fair Standing balance comment: standing using RW                             Pertinent Vitals/Pain Pain Assessment Pain Assessment: No/denies pain    Home Living Family/patient expects to be discharged to:: Private residence Living Arrangements: Children Available Help at Discharge: Family Type of Home: House         Home Layout: One level Home Equipment: Rollator (4 wheels);Cane - single point Additional Comments: has rollator but per granddaughter pt does not use it, pt states she does use SPC intermittegly    Prior Function Prior Level of Function : Independent/Modified Independent             Mobility Comments: SPC/rollator vs no AD ADLs Comments: states she is ind but just needs increased time     Extremity/Trunk Assessment   Upper Extremity Assessment Upper Extremity Assessment: Generalized weakness    Lower Extremity Assessment Lower Extremity Assessment: Generalized weakness    Cervical / Trunk Assessment Cervical / Trunk Assessment: Kyphotic  Communication   Communication Communication: No apparent difficulties    Cognition Arousal: Alert Behavior During Therapy: WFL for tasks assessed/performed   PT - Cognitive impairments: No apparent impairments                         Following commands: Intact       Cueing Cueing Techniques: Verbal cues, Tactile cues     General Comments      Exercises     Assessment/Plan    PT Assessment Patient needs continued PT services  PT Problem List Decreased range of motion;Decreased strength;Decreased activity tolerance;Decreased balance;Decreased mobility;Decreased knowledge of use of DME;Decreased safety awareness       PT Treatment Interventions DME instruction;Gait training;Stair training;Functional mobility training;Therapeutic activities;Therapeutic exercise;Balance training;Cognitive remediation;Patient/family education    PT Goals  (Current goals can be found in the Care Plan section)  Acute Rehab PT Goals Patient Stated Goal: to get stronger and return home PT Goal Formulation: With patient/family Time For Goal Achievement: 11/01/23 Potential to Achieve Goals: Good    Frequency Min 2X/week     Co-evaluation PT/OT/SLP Co-Evaluation/Treatment: Yes Reason for Co-Treatment: To address functional/ADL transfers PT goals addressed during session: Mobility/safety with mobility;Strengthening/ROM;Proper use of DME;Balance         AM-PAC PT 6 Clicks Mobility  Outcome Measure Help needed turning from your back to your side while in a flat bed without using bedrails?: A Little Help needed moving from lying on your back to sitting on the side of a flat bed without using bedrails?: A Little Help needed moving to and from a bed to a chair (including a wheelchair)?: A Little Help needed standing up from a chair using your arms (e.g., wheelchair or bedside chair)?: A Little Help needed to walk in hospital room?: A Lot Help needed climbing 3-5 steps with a railing? : A Lot 6 Click Score: 16    End of Session Equipment Utilized During Treatment: Oxygen  Activity Tolerance: Patient tolerated treatment well Patient left: in bed;with call bell/phone within reach;with bed alarm set;with family/visitor present Nurse Communication: Mobility status PT Visit Diagnosis: Unsteadiness on feet (R26.81);Other abnormalities of gait and mobility (R26.89);Muscle weakness (  generalized) (M62.81)    Time: 8597-8574 PT Time Calculation (min) (ACUTE ONLY): 23 min   Charges:                 Annice Jolly Romero-Perozo, SPT  10/18/2023, 3:04 PM

## 2023-10-18 NOTE — Progress Notes (Signed)
 Heart Failure Stewardship Pharmacy Note  PCP: Rudolpho Norleen BIRCH, MD PCP-Cardiologist: None  HPI: JOHNANNA Stewart is a 78 y.o. female with hypertension, hyperlipidemia, chronic respiratory failure (3L), COPD, pulmonary nodule, atrial flutter who presented with several month history of progressive fatigue, LEE, and orthopnea. On admission, BNP was 1150, HS-troponin was 33, AST 132, ALT 145, CRP 1.9, A1c of 6.7, and TSH was 0.627. Chest x-ray noted possible small pleural effusions. TTE this admission noted LVEF reduced to 35-40% with low normal RV function.   Pertinent Lab Values: Creatinine, Ser  Date Value Ref Range Status  10/18/2023 0.91 0.44 - 1.00 mg/dL Final   BUN  Date Value Ref Range Status  10/18/2023 43 (H) 8 - 23 mg/dL Final   Potassium  Date Value Ref Range Status  10/18/2023 4.2 3.5 - 5.1 mmol/L Final  07/18/2013 3.5 3.5 - 5.1 mmol/L Final   Sodium  Date Value Ref Range Status  10/18/2023 141 135 - 145 mmol/L Final   B Natriuretic Peptide  Date Value Ref Range Status  10/15/2023 1,150.0 (H) 0.0 - 100.0 pg/mL Final    Comment:    Performed at [facility], [address] [place], [place] [address]   Magnesium   Date Value Ref Range Status  10/18/2023 2.4 1.7 - 2.4 mg/dL Final    Comment:    Performed at [facility], [address] [place], [place] [address]   Hgb A1c MFr Bld  Date Value Ref Range Status  10/16/2023 6.7 (H) 4.8 - 5.6 % Final    Comment:    (NOTE) Diagnosis of Diabetes The following HbA1c ranges recommended by the American Diabetes Association (ADA) may be used as an aid in the diagnosis of diabetes mellitus.  Hemoglobin             Suggested A1C NGSP%              Diagnosis  <5.7                   Non Diabetic  5.7-6.4                Pre-Diabetic  >6.4                   Diabetic  <7.0                   Glycemic control for                       adults with diabetes.     TSH  Date Value Ref Range Status   10/15/2023 0.627 0.350 - 4.500 uIU/mL Final    Comment:    Performed by a 3rd Generation assay with a functional sensitivity of <=0.01 uIU/mL. Performed at [facility], [address] [place], [place] [address]    LDH  Date Value Ref Range Status  02/23/2020 220 (H) 98 - 192 U/L Final    Comment:    Performed at [facility], [address] [place], [place] [address]    Vital Signs: Temp:  [97.5 F (36.4 C)-98.7 F (37.1 C)] 98.7 F (37.1 C) (07/22 0745) Pulse Rate:  [30-119] 103 (07/22 0742) Cardiac Rhythm: Normal sinus rhythm (07/21 1901) Resp:  [15-35] 20 (07/22 0742) BP: (102-133)/(74-96) 130/90 (07/22 0742) SpO2:  [97 %-100 %] 100 % (07/22 0742) FiO2 (%):  [30 %] 30 % (07/22 0757) Weight:  [51.7 kg (113 lb 15.7 oz)]  51.7 kg (113 lb 15.7 oz) (07/22 0500)  Intake/Output Summary (Last 24 hours) at 10/18/2023 0836 Last data filed at 10/18/2023 0126 Gross per 24 hour  Intake --  Output 1850 ml  Net -1850 ml    Current Heart Failure Medications:  Loop diuretic: furosemide  40 mg IV q12h Beta-Blocker: metoprolol  tartrate 25 mg BID ACEI/ARB/ARNI: losartan  100 mg daily MRA: spironolactone  25 mg daily SGLT2i: Farxiga  10 mg daily Other: none  Prior to admission Heart Failure Medications:  Loop diuretic: furosemide  20 mg daily Beta-Blocker: none ACEI/ARB/ARNI: losartan  100 mg daily MRA: none SGLT2i: none Other: amlodipine 2.5 mg daily  Assessment: 1. Acute on chronic systolic heart failure (LVEF 35-40%) low normal RV function, due to NICM. NYHA class IV symptoms.  -Symptoms: Patient has obvious shortness of breath at rest on 5L O2 during trial off BiPAP today. Pulse during this time was 120-130s bpm. Patient still has LEE. JVP somewhat elevated. Persistent orthopnea. Reports appetite is improving.  -Volume: Hypervolemic on exam. Currently diuresing with furosemide  40 mg IV BID. Patient is developing contraction alkalosis with diuresis. Would  continue diuresis at this time, though may require adjustment tomorrow. -Hemodynamics: BP and HR elevated during visit this AM, likely secondary to respiratory distress. -BB: Currently on metoprolol  tartrate 25 mg BID. Given COPD, persistent BiPAP requirement, and sinus tachycardia, would consider decreasing and changing BB to bisoprolol  2.5 mg daily given.  -ACEI/ARB/ARNI: Currently on losartan  100 mg daily. May benefit from transition to Entresto .  -MRA: Continue spironolactone  25 mg daily. -SGLT2i: Continue Farxiga  10 mg daily.  Plan: 1) Medication changes recommended at this time: -Consider changing beta blocker to bisoprolol  2.5 mg daily  2) Patient assistance: -Copays for Farxiga , Jardiance, Entresto  are $0 -Patient reports she would appreciate mail order prescriptions from [facility]  3) Education: - Patient has been educated on current HF medications and potential additions to HF medication regimen - Patient verbalizes understanding that over the next few months, these medication doses may change and more medications may be added to optimize HF regimen - Patient has been educated on basic disease state pathophysiology and goals of therapy  Medication Assistance / Insurance Benefits Check: Does the patient have prescription insurance?    Type of insurance plan:  Does the patient qualify for medication assistance through manufacturers or grants? No   Outpatient Pharmacy: Prior to admission outpatient pharmacy: [facility]      Please do not hesitate to reach out with questions or concerns,  Monica Stewart, PharmD, CPP, BCPS, [facility] Heart Failure Pharmacist  Phone - [phone] 10/18/2023 11:47 AM

## 2023-10-18 NOTE — Care Management Important Message (Signed)
 Important Message  Patient Details  Name: Monica Stewart MRN: [id number] Date of Birth: 1945/07/09   Important Message Given:        [name] 10/18/2023, 1:39 PM

## 2023-10-18 NOTE — Progress Notes (Signed)
 PULMONOLOGY         Date: 10/18/2023,   MRN# [id number] Monica Stewart 06/13/1945     AdmissionWeight: 50.9 kg                 CurrentWeight: 51.7 kg  Referring provider: Dr Core   CHIEF COMPLAINT:    Acute on chronic hypoxemic and hypercapnic respiratory failure  HISTORY OF PRESENT ILLNESS   This is a 78 yo F with stage 4 COPD and chronic hypoxemia on 3L/min [place] and recurrent hypercapnic encephalopathy.  Daughter reports courses of confusion at home even at rest. She is weak with deconditioning with baseline mMRC at 4.  She also has advanced Systolic CHF with EF <35% And a list of additional comorbidities. She came in to ER with worsening respiratory distress.  She has been compliant with combination ICS/LABA/LAMA at home with Trelegy once dialy. She also takes chronic prednisone  5mg  and Daliresp . She required BIPAP on arrival and is in respiratory distress. ABG with severe hypercapnic hypoxemic resp failure.  PCCM consultation for further evaluation and management.   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 She is with BODE score > 8 and overall prognosis which carries high risk of death including <20% chance of long term survival.  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   CARDIAC CATHETERIZATION     CATARACT EXTRACTION W/PHACO Right 01/23/2015   Procedure: CATARACT EXTRACTION PHACO AND INTRAOCULAR LENS PLACEMENT (IOC);  Surgeon: Newell Ovens, MD;  Location: [facility] ORS;  Service: Ophthalmology;  Laterality: Right;  US             1.09 AP              17.7 CDE         12.16 casette lot # [id number]   EYE SURGERY     TUBAL LIGATION       FAMILY HISTORY   Family History  Problem Relation Age of Onset   Breast cancer Sister 64   Breast cancer Paternal Aunt      SOCIAL HISTORY   Social History   Tobacco Use   Smoking status: Former    Current packs/day: 0.00    Types: Cigarettes    Quit date: 01/22/1989    Years since quitting: 34.7   Smokeless tobacco: Never  Substance Use Topics   Alcohol  use: No   Drug use: No     MEDICATIONS    Home Medication:    Current Medication:  Current Facility-Administered Medications:    acetaminophen  (TYLENOL ) tablet 650 mg, 650 mg, Oral, Q6H PRN **OR** acetaminophen  (TYLENOL ) suppository 650 mg, 650 mg, Rectal, Q6H PRN, Core, Prentice BROCKS, MD   albuterol  (PROVENTIL ) (2.5 MG/3ML) 0.083% nebulizer solution 2.5 mg, 2.5 mg, Nebulization, Q2H PRN, Core, Prentice BROCKS, MD, 2.5 mg at 10/16/23 1106   ALPRAZolam  (XANAX ) tablet 0.25 mg, 0.25 mg, Oral, TID PRN, Patel, Sona, MD, 0.25 mg at 10/18/23 1208   apixaban  (ELIQUIS ) tablet 5 mg, 5 mg, Oral, BID,  Decoste, Gabriella, PA-C, 5 mg at 10/18/23 9055   calcium -vitamin D  (OSCAL WITH D) 500-5 MG-MCG per tablet 1 tablet, 1 tablet, Oral, Daily, Core, Prentice BROCKS, MD, 1 tablet at 10/18/23 9056   Chlorhexidine  Gluconate Cloth 2 % PADS 6 each, 6 each, Topical, Daily, Patel, Sona, MD, 6 each at 10/18/23 0944   dapagliflozin  propanediol (FARXIGA ) tablet 10 mg, 10 mg, Oral, Daily, Decoste, Gabriella, PA-C, 10 mg at 10/18/23 0957   feeding supplement (ENSURE PLUS HIGH PROTEIN) liquid 237 mL, 237 mL, Oral, BID BM, Patel, Sona, MD   fluticasone  (FLONASE ) 50 MCG/ACT nasal spray 2 spray, 2 spray, Each Nare, Daily, Core, Prentice BROCKS, MD, 2 spray at 10/18/23 0945   furosemide  (LASIX ) injection 40 mg, 40 mg, Intravenous, Q12H, Core, Prentice BROCKS, MD, 40 mg at 10/18/23 1208   insulin  aspart (novoLOG ) injection 0-5 Units, 0-5 Units, Subcutaneous, QHS, Patel, Sona, MD   insulin  aspart  (novoLOG ) injection 0-9 Units, 0-9 Units, Subcutaneous, TID WC, Patel, Sona, MD, 2 Units at 10/18/23 1224   levalbuterol  (XOPENEX ) nebulizer solution 0.63 mg, 0.63 mg, Nebulization, Q6H, Patel, Sona, MD, 0.63 mg at 10/18/23 9251   losartan  (COZAAR ) tablet 100 mg, 100 mg, Oral, Daily, Core, Prentice BROCKS, MD, 100 mg at 10/18/23 9056   metoprolol  tartrate (LOPRESSOR ) tablet 25 mg, 25 mg, Oral, BID, Decoste, Gabriella, PA-C, 25 mg at 10/18/23 0944   multivitamin with minerals tablet 1 tablet, 1 tablet, Oral, Daily, Core, Prentice BROCKS, MD, 1 tablet at 10/18/23 9056   ondansetron  (ZOFRAN ) tablet 4 mg, 4 mg, Oral, Q6H PRN **OR** ondansetron  (ZOFRAN ) injection 4 mg, 4 mg, Intravenous, Q6H PRN, Core, Prentice BROCKS, MD   polyethylene glycol (MIRALAX  / GLYCOLAX ) packet 17 g, 17 g, Oral, Daily PRN, Core, Prentice BROCKS, MD   [COMPLETED] methylPREDNISolone  sodium succinate (SOLU-MEDROL ) 40 mg/mL injection 40 mg, 40 mg, Intravenous, Q12H, 40 mg at 10/16/23 2110 **FOLLOWED BY** predniSONE  (DELTASONE ) tablet 40 mg, 40 mg, Oral, Q breakfast, Patel, Sona, MD, 40 mg at 10/18/23 9056   spironolactone  (ALDACTONE ) tablet 25 mg, 25 mg, Oral, Daily, Decoste, Gabriella, PA-C, 25 mg at 10/18/23 0944   umeclidinium-vilanterol (ANORO ELLIPTA ) 62.5-25 MCG/ACT 1 puff, 1 puff, Inhalation, Daily, Core, Prentice BROCKS, MD, 1 puff at 10/18/23 0945    ALLERGIES   Lisinopril-hydrochlorothiazide      REVIEW OF SYSTEMS    Review of Systems:  Gen:  Denies  fever, sweats, chills weigh loss  HEENT: Denies blurred vision, double vision, ear pain, eye pain, hearing loss, nose bleeds, sore throat Cardiac:  No dizziness, chest pain or heaviness, chest tightness,edema Resp:   reports dyspnea chronically  Gi: Denies swallowing difficulty, stomach pain, nausea or vomiting, diarrhea, constipation, bowel incontinence Gu:  Denies bladder incontinence, burning urine Ext:   Denies Joint pain, stiffness or swelling Skin: Denies  skin rash, easy bruising or  bleeding or hives Endoc:  Denies polyuria, polydipsia , polyphagia or weight change Psych:   Denies depression, insomnia or hallucinations   Other:  All other systems negative   VS: BP 98/67 (BP Location: Right Arm)   Pulse 79   Temp 98.7 F (37.1 C)   Resp 19   Ht 5' 4 (1.626 m)   Wt 51.7 kg   SpO2 100%   BMI 19.56 kg/m      PHYSICAL EXAM    GENERAL:NAD, no fevers, chills, no weakness no fatigue HEAD: Normocephalic, atraumatic.  EYES: Pupils equal, round, reactive to light. Extraocular muscles intact. No scleral icterus.  MOUTH: Moist mucosal  membrane. Dentition intact. No abscess noted.  EAR, NOSE, THROAT: Clear without exudates. No external lesions.  NECK: Supple. No thyromegaly. No nodules. No JVD.  PULMONARY: decreased breath sounds with mild rhonchi worse at bases bilaterally.  CARDIOVASCULAR: S1 and S2. Regular rate and rhythm. No murmurs, rubs, or gallops. No edema. Pedal pulses 2+ bilaterally.  GASTROINTESTINAL: Soft, nontender, nondistended. No masses. Positive bowel sounds. No hepatosplenomegaly.  MUSCULOSKELETAL: No swelling, clubbing, or edema. Range of motion full in all extremities.  NEUROLOGIC: Cranial nerves II through XII are intact. No gross focal neurological deficits. Sensation intact. Reflexes intact.  SKIN: No ulceration, lesions, rashes, or cyanosis. Skin warm and dry. Turgor intact.  PSYCHIATRIC: Mood, affect within normal limits. The patient is awake, alert and oriented x 3. Insight, judgment intact.       IMAGING   CLINICAL DATA: COPD exacerbation  EXAM: PORTABLE CHEST - 1 VIEW  COMPARISON: 02/23/2020  FINDINGS: Pulmonary hyperinflation with attenuated bronchovascular markings in both upper lobes. No focal airspace disease. Mild interstitial prominence in the lung bases, increased from previous.  Heart size and mediastinal contours are within normal limits. Aortic Atherosclerosis (ICD10-170.0).  Mild blunting of the lateral  costophrenic angles.  Visualized bones unremarkable.  IMPRESSION: 1. Pulmonary hyperinflation with mild bibasilar interstitial prominence. 2. Possible small pleural effusions.   Electronically Signed By: JONETTA Faes M.D. On: 10/15/2023 21:15   ASSESSMENT/PLAN   Acute on chronic hypoxemic hypercapnic respiratory failure    -due to Severe Acute COPD exacerbation    -possible culprit is viral vs bacterial infection     - CRP trend    - procalcitonin trend    - respiratory culture    - resp viral panel    - COVID/FLU/RSV negative    - continue COPD care path with steroids, nebs, abx    - IS as able , flutter as able     - repeat CXR in 48hr    -legionella and strep pneumoniae testing    -BODE score >8 with <20% long term survival    - palliative care consultation for possible comfort care / hospice evaluation     Hypercapnic respiratory failure with Encephalopathy    - continue BIPAP     - repeat VBG in 6 hr   Bibasilar atelelctasis      Chest physiotherapy      - continue BIPAP      - PT/OT     Advanced Systolic CHF      - continue diuresis prn      - trans thoracic echo with both left and right heart dysfunction - ddimer to rule out PE      - LVEF <35%      Thank you for allowing me to participate in the care of this patient.   Patient/Family are satisfied with care plan and all questions have been answered.    Provider disclosure: Patient with at least one acute or chronic illness or injury that poses a threat to life or bodily function and is being managed actively during this encounter.  All of the below services have been performed independently by signing provider:  review of prior documentation from internal and or external health records.  Review of previous and current lab results.  Interview and comprehensive assessment during patient visit today. Review of current and previous chest radiographs/CT scans. Discussion of management and test interpretation  with health care team and patient/family.   This document was prepared using Conservation officer, historic buildings and  may include unintentional dictation errors.     Eion Timbrook, M.D.  Division of Pulmonary & Critical Care Medicine

## 2023-10-18 NOTE — Progress Notes (Signed)
 [facility]  - [place] [facility]   PATIENT NAME: Monica Stewart    MR#:  [id number]  DATE OF BIRTH:  09/24/1945  SUBJECTIVE:  Granddaughter/and dter Nat at bedside.  Patient improving. Weaned off BiPAP will use it as needed. Currently sats stable on 3 L nasal cannula oxygen   VITALS:  Blood pressure 98/67, pulse 79, temperature 98.7 F (37.1 C), resp. rate 19, height 5' 4 (1.626 m), weight 51.7 kg, SpO2 100%.  PHYSICAL EXAMINATION:   GENERAL:  78 y.o.-year-old patient with moderate acute distress. Frail, malnourished LUNGS: distant breath sounds bilaterally, no wheezing on [place] oxygen  CARDIOVASCULAR: S1, S2 normal. No murmur tachycardia ABDOMEN: Soft, nontender, nondistended.  EXTREMITIES: + edema b/l.    NEUROLOGIC: nonfocal  patient is alert and awake, anxious   LABORATORY PANEL:  CBC Recent Labs  Lab 10/18/23 0548  WBC 10.9*  HGB 11.4*  HCT 35.4*  PLT 201    Chemistries  Recent Labs  Lab 10/16/23 0149 10/17/23 1015 10/18/23 0548  NA 137   < > 141  K 4.4   < > 4.2  CL 90*   < > 91*  CO2 35*   < > 37*  GLUCOSE 140*   < > 103*  BUN 22   < > 43*  CREATININE 0.83   < > 0.91  CALCIUM  9.6   < > 9.5  MG  --   --  2.4  AST 148*  --   --   ALT 156*  --   --   ALKPHOS 56  --   --   BILITOT 0.5  --   --    < > = values in this interval not displayed.    Assessment and Plan  78 year old female with a past medical history of chronic respiratory failure on 3 L home oxygen , chronic obstructive pulmonary disorder FEV1 of 0.6 L, pulmonary nodule, gastroesophageal reflux disease who presents to the emergency department with increasing shortness of breath and dependent edema the patient is talking fluently on BiPAP reporting shortness of breath worsening since yesterday she reports increased sputum production and slightly increased cough denies any fever or chills she also reports approximately 2 months of increasing dependent edema she does endorse  orthopnea. Patient does not smoke currently however was a heavy smoker many years ago. She follows with Dr. Theotis of pulmonary as outpatient  Acute hypoxic Hypercapneic respiratory failure severe end-stage COPD/emphysema chronic respiratory failure oxygen  dependent 3 L at home/steroid dependent COPD elevated D dimer ?PE -- patient currently on BiPAP. She is using accessory muscles for breathing. -- Pulmonary  consultation with Dr. DELENA. -- Palliative care consultation -- patient has overall a poor prognosis -- IV Solu-Medrol , IV Lasix , nebulizer, BiPAP -- Xanax  PRN now able to take orally  Acute systolic congestive heart failure-- new atrial fibrillation new onset -- elevated BNP, orthopnea, pulmonary vascular congestion noted on chest x-ray and EF of 35 to 40% with severe global hypokinesis -- IV Lasix  40 mg BID -- currently on losartan  and metoprolol  if patient can tolerate PO --  cardiology consultation with [facility]. -- Started on Spironolactone ,farxiga , losartan , metoprolol  -- per cardiology now on eliquis   Failure to thrive/malnourished in the setting of severe COPD Difficulty swallowing --  ST input noted  Elevated D dimer/? suspected PE -- now on eliquis  -- patient not able to lay flat and unstable for CT scan  Hyperlipidemia -- statins   DM (diabetes mellitus), type 2 (HCC) Type 2 diabetes  mellitus with anticipated steroid-induced hyperglycemia --Have placed on sliding scale insulin   FTT/moderate to severe malnutrition --RD to see    Palliative care consultation appreciated.     Procedures: Family communication : daughter Nat, granddaughter Consults : pulmonary CODE STATUS: FULL DVT Prophylaxis : eliquis  Level of care: Progressive Status is: Inpatient Remains inpatient appropriate because: severe respiratory failure, CHF, FTT    TOTAL critical TIME TAKING CARE OF THIS PATIENT: 35 minutes.  >50% time spent on counselling and coordination of  care  Note: This dictation was prepared with Dragon dictation along with smaller phrase technology. Any transcriptional errors that result from this process are unintentional.  Leita Blanch M.D    [facility]   CC: Primary care physician; Rudolpho Norleen BIRCH, MD

## 2023-10-18 NOTE — Progress Notes (Signed)
 [facility] CLINIC CARDIOLOGY PROGRESS NOTE       Patient ID: Monica Stewart MRN: [id number] DOB/AGE: [birth year] 78 y.o.  Admit date: 10/15/2023 Referring Physician Dr. Tobie Primary Physician Rudolpho Norleen BIRCH, MD Primary Cardiologist Dr. Bosie (2015) Reason for Consultation Newly reduced EF  HPI: Monica Stewart is a 78 y.o. female  with a past medical history of hypertension, hyperlipidemia, chronic respiratory failure (3L), COPD, pulmonary nodule and patient reports hx of atrial flutter (years ago) who presented to the ED on 10/15/2023 for worsening fatigue, lower extremity swelling, fatigue for months.  Patient endorses orthopnea.  EKG on 07/21 and telemetry new onset atrial flutter relation.  Echo during this admission revealed a reduced EF 35-45%. Cardiology was consulted for further evaluation.   Interval History: -Patient seen and examined this AM and laying comfortably in hospital bed family at bedside.  Patient states a lot better today, states SOB is improving and denies chest pain, palpitations or lightheadedness.  -Patients BP and HR stable this AM. Overnight Tele showed no significant events.  -Yesterday UOP 1.85L, with stable renal function. -Patient remains on 3L with stable SpO2.    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   CARDIAC CATHETERIZATION     CATARACT EXTRACTION W/PHACO Right 01/23/2015   Procedure: CATARACT EXTRACTION PHACO AND INTRAOCULAR LENS PLACEMENT (IOC);  Surgeon: Newell Ovens, MD;  Location: [facility] ORS;  Service: Ophthalmology;  Laterality: Right;  US             1.09 AP              17.7 CDE         12.16 casette lot # [id number]   EYE SURGERY     TUBAL LIGATION      Medications Prior to Admission  Medication Sig Dispense Refill Last Dose/Taking   albuterol  (VENTOLIN  HFA) 108 (90 Base) MCG/ACT inhaler Inhale 1-2 puffs into the lungs every 6 (six) hours as needed for wheezing or shortness of breath.   10/16/2023   alendronate (FOSAMAX) 70 MG tablet Take 70 mg by mouth once a week.   10/15/2023 Evening   amLODipine (NORVASC) 2.5 MG tablet Take 2.5 mg by mouth daily.   10/15/2023 Evening   Azelastine HCl 137 MCG/SPRAY SOLN Place 1 spray into both nostrils 2 (two) times daily.   10/15/2023 Evening   calcium  citrate-vitamin D  500-400 MG-UNIT chewable tablet Chew 1 tablet by mouth daily.    10/15/2023 Evening   Ferrous Fumarate (HEMOCYTE - 106 MG FE) 324 (106 Fe) MG TABS tablet Take 1 tablet by mouth every other day.   Unknown   fluticasone  (FLONASE ) 50 MCG/ACT nasal spray Place 2 sprays into both nostrils daily.   10/15/2023 Evening   furosemide  (LASIX ) 20 MG tablet Take 20 mg by mouth daily.   10/16/2023 Morning   Iron-Vitamins (GERITOL PO) Take 1 tablet by mouth daily.   Unknown   loratadine  (CLARITIN ) 10 MG tablet Take 10 mg by mouth daily.   10/15/2023 Evening   losartan  (COZAAR ) 100 MG tablet  Take 100 mg by mouth daily.   10/15/2023 Evening   lovastatin (MEVACOR) 40 MG tablet Take 40 mg by mouth at bedtime.   10/15/2023 Evening   meloxicam (MOBIC) 7.5 MG tablet Take 7.5 mg by mouth daily.   10/16/2023 Morning   Multiple Vitamins-Minerals (MULTIVITAMIN WITH MINERALS) tablet Take 1 tablet by mouth daily.   10/16/2023 Morning   NON FORMULARY Take 1 capsule by mouth daily.   Unknown   predniSONE  (DELTASONE ) 5 MG tablet Take 5 mg by mouth daily.   10/16/2023 Morning   Roflumilast 250 MCG TABS Take 250 mcg by mouth daily.   10/15/2023 Evening   TRELEGY ELLIPTA 100-62.5-25 MCG/INH AEPB Inhale 1 puff into the lungs daily.   10/16/2023 Morning   hydrochlorothiazide  (HYDRODIURIL ) 25 MG  tablet Take 25 mg by mouth daily. (Patient not taking: Reported on 10/16/2023)   Not Taking   Ipratropium-Albuterol  (COMBIVENT ) 20-100 MCG/ACT AERS respimat Inhale 1 puff into the lungs every 6 (six) hours for 7 days.  0    Social History   Socioeconomic History   Marital status: Divorced    Spouse name: Not on file   Number of children: Not on file   Years of education: Not on file   Highest education level: Not on file  Occupational History   Not on file  Tobacco Use   Smoking status: Former    Current packs/day: 0.00    Types: Cigarettes    Quit date: 01/22/1989    Years since quitting: 34.7   Smokeless tobacco: Never  Substance and Sexual Activity   Alcohol  use: No   Drug use: No   Sexual activity: Not on file  Other Topics Concern   Not on file  Social History Narrative   Not on file   Social Drivers of Health   Financial Resource Strain: Low Risk  (06/27/2023)   Received from [facility]   Overall Financial Resource Strain (CARDIA)    Difficulty of Paying Living Expenses: Not very hard  Food Insecurity: Food Insecurity Present (06/27/2023)   Received from [facility]   Hunger Vital Sign    Within the past 12 months, you worried that your food would run out before you got the money to buy more.: Never true    Within the past 12 months, the food you bought just didn't last and you didn't have money to get more.: Sometimes true  Transportation Needs: No Transportation Needs (06/27/2023)   Received from [facility] - Transportation    In the past 12 months, has lack of transportation kept you from medical appointments or from getting medications?: No    Lack of Transportation (Non-Medical): No  Physical Activity: Not on file  Stress: Not on file  Social Connections: Not on file  Intimate Partner Violence: Not on file    Family History  Problem Relation Age of Onset   Breast cancer Sister 13   Breast  cancer Paternal Aunt      Vitals:   10/18/23 0742 10/18/23 0745 10/18/23 1046 10/18/23 1200  BP: (!) 130/90  117/70 98/67  Pulse: (!) 103  98 79  Resp: 20  20 19   Temp:  98.7 F (37.1 C) 98.7 F (37.1 C)   TempSrc:      SpO2: 100%  100%   Weight:      Height:        PHYSICAL EXAM General: Chronically ill appearing female, well nourished, in  no acute distress. HEENT: Normocephalic and atraumatic. Neck: No JVD.   Lungs: Normal respiratory effort on Bipap. Diminished breath sounds bilaterally Heart: HRR, elevated HR. Normal S1 and S2 without gallops or murmurs.  Abdomen: Non-distended appearing.  Msk: Normal strength and tone for age. Extremities: Warm and well perfused. No clubbing, cyanosis. 1+ pedal edema.  Neuro: Alert and oriented X 3. Psych: Answers questions appropriately.   Labs: Basic Metabolic Panel: Recent Labs    10/15/23 2239 10/16/23 0149 10/17/23 1015 10/18/23 0548  NA 137   < > 139 141  K 3.9   < > 4.0 4.2  CL 94*   < > 89* 91*  CO2 33*   < > 37* 37*  GLUCOSE 198*   < > 115* 103*  BUN 21   < > 30* 43*  CREATININE 0.92   < > 0.80 0.91  CALCIUM  8.3*   < > 8.8* 9.5  MG 2.6*  --   --  2.4  PHOS  --   --   --  4.5   < > = values in this interval not displayed.   Liver Function Tests: Recent Labs    10/15/23 2239 10/16/23 0149  AST 132* 148*  ALT 145* 156*  ALKPHOS 58 56  BILITOT 0.6 0.5  PROT 6.6 7.0  ALBUMIN 3.5 3.5   No results for input(s): LIPASE, AMYLASE in the last 72 hours. CBC: Recent Labs    10/15/23 2105 10/16/23 0149 10/18/23 0548  WBC 7.5 9.3 10.9*  NEUTROABS 5.7  --   --   HGB 12.2 11.7* 11.4*  HCT 39.5 36.6 35.4*  MCV 95.0 92.2 91.2  PLT 234 206 201   Cardiac Enzymes: Recent Labs    10/15/23 2105 10/15/23 2239  TROPONINIHS 33* 48*   BNP: Recent Labs    10/15/23 2105  BNP 1,150.0*   D-Dimer: Recent Labs    10/15/23 2354 10/16/23 1404  DDIMER 1.44* 1.18*   Hemoglobin A1C: Recent Labs     10/16/23 0149  HGBA1C 6.7*   Fasting Lipid Panel: No results for input(s): CHOL, HDL, LDLCALC, TRIG, CHOLHDL, LDLDIRECT in the last 72 hours. Thyroid Function Tests: Recent Labs    10/15/23 2354  TSH 0.627   Anemia Panel: No results for input(s): VITAMINB12, FOLATE, FERRITIN, TIBC, IRON, RETICCTPCT in the last 72 hours.   Radiology: ECHOCARDIOGRAM COMPLETE Result Date: 10/16/2023    ECHOCARDIOGRAM REPORT   Patient Name:   Monica Stewart Date of Exam: 10/16/2023 Medical Rec #:  [id number]       Height:       64.0 in Accession #:    [id number]      Weight:       112.2 lb Date of Birth:  1945/08/20       BSA:          1.530 m Patient Age:    77 years        BP:           137/88 mmHg Patient Gender: F               HR:           115 bpm. Exam Location:  [facility] Procedure: 2D Echo, 3D Echo, Cardiac Doppler, Color Doppler and Strain Analysis            (Both Spectral and Color Flow Doppler were utilized during            procedure). Indications:  Elevated Troponin  History:         Patient has prior history of Echocardiogram examinations, most                  recent 05/03/2023.  Sonographer:     Thedora Louder RDCS, FASE Referring Phys:  [id number] PRENTICE BROCKS CORE Diagnosing Phys: Cara JONETTA Lovelace MD  Sonographer Comments: Global longitudinal strain was attempted. IMPRESSIONS  1. Left ventricular ejection fraction, by estimation, is 35 to 40%. The left ventricle has moderately decreased function. The left ventricle demonstrates global hypokinesis. The left ventricular internal cavity size was mildly dilated. Left ventricular diastolic function could not be evaluated. The average left ventricular global longitudinal strain is 11.1 %. The global longitudinal strain is abnormal.  2. Right ventricular systolic function is low normal. The right ventricular size is mildly enlarged.  3. The mitral valve is normal in structure. Trivial mitral valve regurgitation.  4. The aortic valve is normal  in structure. Aortic valve regurgitation is not visualized. Aortic valve sclerosis is present, with no evidence of aortic valve stenosis. FINDINGS  Left Ventricle: Left ventricular ejection fraction, by estimation, is 35 to 40%. The left ventricle has moderately decreased function. The left ventricle demonstrates global hypokinesis. The average left ventricular global longitudinal strain is 11.1 %.  Strain was performed and the global longitudinal strain is abnormal. The left ventricular internal cavity size was mildly dilated. There is no left ventricular hypertrophy. Left ventricular diastolic function could not be evaluated. Right Ventricle: The right ventricular size is mildly enlarged. No increase in right ventricular wall thickness. Right ventricular systolic function is low normal. Left Atrium: Left atrial size was normal in size. Right Atrium: Right atrial size was normal in size. Pericardium: There is no evidence of pericardial effusion. Mitral Valve: The mitral valve is normal in structure. Trivial mitral valve regurgitation. Tricuspid Valve: The tricuspid valve is normal in structure. Tricuspid valve regurgitation is mild. Aortic Valve: The aortic valve is normal in structure. Aortic valve regurgitation is not visualized. Aortic valve sclerosis is present, with no evidence of aortic valve stenosis. Aortic valve peak gradient measures 5.8 mmHg. Pulmonic Valve: The pulmonic valve was normal in structure. Pulmonic valve regurgitation is not visualized. Aorta: The ascending aorta was not well visualized. IAS/Shunts: No atrial level shunt detected by color flow Doppler. Additional Comments: 3D was performed not requiring image post processing on an independent workstation and was abnormal.  LEFT VENTRICLE PLAX 2D LVIDd:         2.75 cm     Diastology LVIDs:         3.60 cm     LV e' medial:    19.00 cm/s LV PW:         1.00 cm     LV E/e' medial:  5.6 LV IVS:        3.15 cm     LV e' lateral:   13.80 cm/s LVOT  diam:     1.80 cm     LV E/e' lateral: 7.7 LV SV:         45 LV SV Index:   29          2D Longitudinal Strain LVOT Area:     2.54 cm    2D Strain GLS (A4C):   10.2 %                            2D Strain GLS (A3C):  11.4 %                            2D Strain GLS (A2C):   11.7 % LV Volumes (MOD)           2D Strain GLS Avg:     11.1 % LV vol d, MOD A2C: 68.8 ml LV vol d, MOD A4C: 81.0 ml LV vol s, MOD A2C: 42.5 ml LV vol s, MOD A4C: 47.9 ml 3D Volume EF: LV SV MOD A2C:     26.3 ml 3D EF:        40 % LV SV MOD A4C:     81.0 ml LV EDV:       102 ml LV SV MOD BP:      29.1 ml LV ESV:       61 ml                            LV SV:        41 ml RIGHT VENTRICLE RV Basal diam:  3.00 cm RV S prime:     14.90 cm/s TAPSE (M-mode): 2.0 cm LEFT ATRIUM             Index        RIGHT ATRIUM           Index LA diam:        3.20 cm 2.09 cm/m   RA Area:     10.30 cm LA Vol (A2C):   51.0 ml 33.33 ml/m  RA Volume:   26.00 ml  16.99 ml/m LA Vol (A4C):   35.8 ml 23.39 ml/m LA Biplane Vol: 44.5 ml 29.08 ml/m  AORTIC VALVE                 PULMONIC VALVE AV Area (Vmax): 2.23 cm     PV Vmax:        0.87 m/s AV Vmax:        120.00 cm/s  PV Peak grad:   3.0 mmHg AV Peak Grad:   5.8 mmHg     RVOT Peak grad: 2 mmHg LVOT Vmax:      105.00 cm/s LVOT Vmean:     65.800 cm/s LVOT VTI:       0.176 m  AORTA Ao Root diam: 3.40 cm MITRAL VALVE MV Area (PHT): 6.37 cm     SHUNTS MV Decel Time: 119 msec     Systemic VTI:  0.18 m MV E velocity: 106.00 cm/s  Systemic Diam: 1.80 cm Cara JONETTA Lovelace MD Electronically signed by Cara JONETTA Lovelace MD Signature Date/Time: 10/16/2023/9:49:51 AM    Final    DG Chest Portable 1 View Result Date: 10/15/2023 CLINICAL DATA:  COPD exacerbation EXAM: PORTABLE CHEST - 1 VIEW COMPARISON:  02/23/2020 FINDINGS: Pulmonary hyperinflation with attenuated bronchovascular markings in both upper lobes. No focal airspace disease. Mild interstitial prominence in the lung bases, increased from previous. Heart size and  mediastinal contours are within normal limits. Aortic Atherosclerosis (ICD10-170.0). Mild blunting of the lateral costophrenic angles. Visualized bones unremarkable. IMPRESSION: 1. Pulmonary hyperinflation with mild bibasilar interstitial prominence. 2. Possible small pleural effusions. Electronically Signed   By: JONETTA Faes M.D.   On: 10/15/2023 21:15    ECHO as above  TELEMETRY reviewed by me 10/18/2023: sinus rhythm, rate 90s  EKG reviewed by me: atrial flutter, rate 158 bpm (new onset).  Data reviewed by me 10/18/2023: last 24h vitals tele labs imaging I/O hospitalist progress notes.  Principal Problem:   Acute respiratory failure (HCC) Active Problems:   COPD with acute exacerbation (HCC)   Acute pulmonary embolism (HCC)   DM (diabetes mellitus), type 2 (HCC)   Acute on chronic diastolic CHF (congestive heart failure) (HCC)   Non-ischemic cardiomyopathy (HCC)   Sinus tachycardia   Transaminitis   HTN (hypertension)   HLD (hyperlipidemia)   Osteoporosis   Acute clinical systolic heart failure (HCC)   Palliative care encounter    ASSESSMENT AND PLAN:  Monica Stewart is a 78 y.o. female  with a past medical history of hypertension, hyperlipidemia, chronic respiratory failure (3L), COPD, pulmonary nodule and patient reports hx of atrial flutter (years ago) who presented to the ED on 10/15/2023 for worsening fatigue, lower extremity swelling, fatigue for months.  Patient endorses orthopnea.  EKG on 07/21 and telemetry new onset atrial flutter relation.  Echo during this admission revealed a reduced EF 35-45%. Cardiology was consulted for further evaluation.  # New onset HFrEF # Acute on chronic respiratory failure # COPD exacerbation BNP elevated at 1100.  Chest x-ray with pulmonary vascular congestion.  This admission reveals newly reduced EF of 35-40% with global hypokinesis. - Continue IV Lasix  40 mg twice daily.  Likely transition IV to p.o. Lasix  tomorrow.  Closely monitor UOP  and renal function. - Continue dapagliflozin  10 mg daily. (Copay $0) - Continue losartan  100 mg daily. - Continue metoprolol  tartrate 25 mg twice daily.  - Continue spironolactone  25 mg daily.  # New onset Atrial fibrillation/flutter RVR EKG (07/21) with atrial flutter, rate 158 bpm (new onset). Per tele episode atrial fibrillation RVR. Per tele now remains in sinus tachycardia rate 100s.  -Continue Eliquis  5 mg twice daily for stroke risk reduction.  CHA2DS2-VASc score is at least 6. Patient denies any recent falls, or prior major bleeding.  -Metoprolol  as stated above.  Uptitrate for elevated heart rate if BP allows.  # Hypertension # Hyperlipidemia Troponins minimally elevated and flat 33 > 48. EKG without acute ischemic changes. BP stable. -Continue losartan , metoprolol  as stated above.  This patient's plan of care was discussed and created with Dr. Ammon and he is in agreement.  Signed: Dorene Comfort, PA-C  10/18/2023, 2:44 PM [facility] Cardiology

## 2023-10-18 NOTE — Progress Notes (Signed)
 OT Cancellation Note  Patient Details Name: Monica Stewart MRN: [id number] DOB: November 01, 1945   Cancelled Treatment:    Reason Eval/Treat Not Completed: Medical issues which prohibited therapy Pt currently on BiPAP, will re-assess and see if appropriate. This is the 3rd attempt in 3 days to see patient, will attempt one more time this PM to see if medically appropriate.    Denver Bentson L. Everlee Quakenbush, OTR/L  10/18/23, 9:55 AM

## 2023-10-18 NOTE — Care Management Important Message (Signed)
 Important Message  Patient Details  Name: Monica Stewart MRN: [id number] Date of Birth: February 04, 1946   Important Message Given:  Yes - Medicare IM     [name] 10/18/2023, 1:40 PM

## 2023-10-19 DIAGNOSIS — Z7189 Other specified counseling: Secondary | ICD-10-CM | POA: Diagnosis not present

## 2023-10-19 DIAGNOSIS — J441 Chronic obstructive pulmonary disease with (acute) exacerbation: Secondary | ICD-10-CM | POA: Diagnosis not present

## 2023-10-19 DIAGNOSIS — I428 Other cardiomyopathies: Secondary | ICD-10-CM | POA: Diagnosis not present

## 2023-10-19 DIAGNOSIS — J9601 Acute respiratory failure with hypoxia: Secondary | ICD-10-CM | POA: Diagnosis not present

## 2023-10-19 DIAGNOSIS — I5021 Acute systolic (congestive) heart failure: Secondary | ICD-10-CM | POA: Diagnosis not present

## 2023-10-19 LAB — BASIC METABOLIC PANEL WITH GFR
Anion gap: 15 (ref 5–15)
BUN: 48 mg/dL — ABNORMAL HIGH (ref 8–23)
CO2: 39 mmol/L — ABNORMAL HIGH (ref 22–32)
Calcium: 10.2 mg/dL (ref 8.9–10.3)
Chloride: 87 mmol/L — ABNORMAL LOW (ref 98–111)
Creatinine, Ser: 0.87 mg/dL (ref 0.44–1.00)
GFR, Estimated: >60 mL/min (ref >60)
Glucose, Bld: 133 mg/dL — ABNORMAL HIGH (ref 70–99)
Potassium: 3.7 mmol/L (ref 3.5–5.1)
Sodium: 141 mmol/L (ref 135–145)

## 2023-10-19 LAB — CBC
HCT: 38.1 % (ref 36.0–46.0)
Hemoglobin: 11.9 g/dL — ABNORMAL LOW (ref 12.0–15.0)
MCH: 29.2 pg (ref 26.0–34.0)
MCHC: 31.2 g/dL (ref 30.0–36.0)
MCV: 93.6 fL (ref 80.0–100.0)
Platelets: 208 K/uL (ref 150–400)
RBC: 4.07 MIL/uL (ref 3.87–5.11)
RDW: 14.5 % (ref 11.5–15.5)
WBC: 12.6 K/uL — ABNORMAL HIGH (ref 4.0–10.5)
nRBC: 0 % (ref 0.0–0.2)

## 2023-10-19 LAB — GLUCOSE, CAPILLARY
Glucose-Capillary: 106 mg/dL — ABNORMAL HIGH (ref 70–99)
Glucose-Capillary: 135 mg/dL — ABNORMAL HIGH (ref 70–99)
Glucose-Capillary: 169 mg/dL — ABNORMAL HIGH (ref 70–99)
Glucose-Capillary: 174 mg/dL — ABNORMAL HIGH (ref 70–99)
Glucose-Capillary: 193 mg/dL — ABNORMAL HIGH (ref 70–99)
Glucose-Capillary: 221 mg/dL — ABNORMAL HIGH (ref 70–99)

## 2023-10-19 LAB — PROCALCITONIN: Procalcitonin: 0.17 ng/mL

## 2023-10-19 MED ORDER — BISOPROLOL FUMARATE 5 MG PO TABS
5.0000 mg | ORAL_TABLET | Freq: Every day | ORAL | Status: DC
  Administered 2023-10-19 – 2023-10-20 (×2): 5 mg via ORAL
  Filled 2023-10-19 (×2): qty 1

## 2023-10-19 MED ORDER — LOSARTAN POTASSIUM 50 MG PO TABS
50.0000 mg | ORAL_TABLET | Freq: Every day | ORAL | Status: DC
  Administered 2023-10-20: 50 mg via ORAL
  Filled 2023-10-19: qty 1

## 2023-10-19 MED ORDER — POTASSIUM CHLORIDE CRYS ER 20 MEQ PO TBCR
40.0000 meq | EXTENDED_RELEASE_TABLET | Freq: Once | ORAL | Status: AC
  Administered 2023-10-19: 40 meq via ORAL
  Filled 2023-10-19: qty 2

## 2023-10-19 MED ORDER — LOSARTAN POTASSIUM 50 MG PO TABS: 75.0000 mg | ORAL_TABLET | Freq: Every day | ORAL | Status: DC

## 2023-10-19 MED ORDER — FUROSEMIDE 40 MG PO TABS
40.0000 mg | ORAL_TABLET | Freq: Every day | ORAL | Status: DC
  Administered 2023-10-19: 40 mg via ORAL
  Filled 2023-10-19: qty 1

## 2023-10-19 MED ORDER — BOOST PLUS PO LIQD
237.0000 mL | Freq: Two times a day (BID) | ORAL | Status: DC
  Administered 2023-10-20 – 2023-10-23 (×5): 237 mL via ORAL

## 2023-10-19 MED ORDER — FUROSEMIDE 40 MG PO TABS
40.0000 mg | ORAL_TABLET | Freq: Every day | ORAL | Status: DC
  Administered 2023-10-20 – 2023-10-21 (×2): 40 mg via ORAL
  Filled 2023-10-19 (×2): qty 1

## 2023-10-19 MED ORDER — PREDNISONE 50 MG PO TABS
35.0000 mg | ORAL_TABLET | Freq: Every day | ORAL | Status: AC
  Administered 2023-10-20: 35 mg via ORAL
  Filled 2023-10-19: qty 1

## 2023-10-19 MED ORDER — FUROSEMIDE 40 MG PO TABS: 40.0000 mg | ORAL_TABLET | Freq: Every day | ORAL | Status: DC

## 2023-10-19 NOTE — Progress Notes (Signed)
 Heart Failure Stewardship Pharmacy Note  PCP: Rudolpho Norleen BIRCH, MD PCP-Cardiologist: None  HPI: Monica Stewart is a 78 y.o. female with hypertension, hyperlipidemia, chronic respiratory failure (3L), COPD, pulmonary nodule, atrial flutter who presented with several month history of progressive fatigue, LEE, and orthopnea. On admission, BNP was 1150, HS-troponin was 33, AST 132, ALT 145, CRP 1.9, A1c of 6.7, and TSH was 0.627. Chest x-ray noted possible small pleural effusions. TTE this admission noted LVEF reduced to 35-40% with low normal RV function.   Pertinent Lab Values: Creatinine, Ser  Date Value Ref Range Status  10/19/2023 0.87 0.44 - 1.00 mg/dL Final   BUN  Date Value Ref Range Status  10/19/2023 48 (H) 8 - 23 mg/dL Final   Potassium  Date Value Ref Range Status  10/19/2023 3.7 3.5 - 5.1 mmol/L Final  07/18/2013 3.5 3.5 - 5.1 mmol/L Final   Sodium  Date Value Ref Range Status  10/19/2023 141 135 - 145 mmol/L Final   B Natriuretic Peptide  Date Value Ref Range Status  10/15/2023 1,150.0 (H) 0.0 - 100.0 pg/mL Final    Comment:    Performed at [facility], [address] [place], [place] [address]   Magnesium   Date Value Ref Range Status  10/18/2023 2.4 1.7 - 2.4 mg/dL Final    Comment:    Performed at [facility], [address] [place], [place] [address]   Hgb A1c MFr Bld  Date Value Ref Range Status  10/16/2023 6.7 (H) 4.8 - 5.6 % Final    Comment:    (NOTE) Diagnosis of Diabetes The following HbA1c ranges recommended by the American Diabetes Association (ADA) may be used as an aid in the diagnosis of diabetes mellitus.  Hemoglobin             Suggested A1C NGSP%              Diagnosis  <5.7                   Non Diabetic  5.7-6.4                Pre-Diabetic  >6.4                   Diabetic  <7.0                   Glycemic control for                       adults with diabetes.     TSH  Date Value Ref Range Status   10/15/2023 0.627 0.350 - 4.500 uIU/mL Final    Comment:    Performed by a 3rd Generation assay with a functional sensitivity of <=0.01 uIU/mL. Performed at [facility], [address] [place], [place] [address]    LDH  Date Value Ref Range Status  02/23/2020 220 (H) 98 - 192 U/L Final    Comment:    Performed at [facility], [address] [place], [place] [address]    Vital Signs: Temp:  [97 F (36.1 C)-98.7 F (37.1 C)] 97.6 F (36.4 C) (07/23 0842) Pulse Rate:  [51-102] 97 (07/23 0842) Cardiac Rhythm: Normal sinus rhythm (07/23 0800) Resp:  [12-29] 12 (07/23 0842) BP: (98-123)/(67-89) 107/71 (07/23 0842) SpO2:  [93 %-100 %] 100 % (07/23 0842) FiO2 (%):  [30 %] 30 % (07/23 0741) Weight:  [56.2 kg (123 lb 14.4 oz)]  56.2 kg (123 lb 14.4 oz) (07/23 0500)  Intake/Output Summary (Last 24 hours) at 10/19/2023 9071 Last data filed at 10/19/2023 0330 Gross per 24 hour  Intake --  Output 3200 ml  Net -3200 ml    Current Heart Failure Medications:  Loop diuretic: furosemide  40 mg IV q12h Beta-Blocker: metoprolol  tartrate 25 mg BID ACEI/ARB/ARNI: losartan  100 mg daily MRA: spironolactone  25 mg daily SGLT2i: Farxiga  10 mg daily Other: none  Prior to admission Heart Failure Medications:  Loop diuretic: furosemide  20 mg daily Beta-Blocker: none ACEI/ARB/ARNI: losartan  100 mg daily MRA: none SGLT2i: none Other: amlodipine 2.5 mg daily  Assessment: 1. Acute on chronic systolic heart failure (LVEF 35-40%) low normal RV function, due to NICM. NYHA class IV symptoms.  -Symptoms: Patient requiring BiPAP today with 3L O2. Patient reports continued improvement, though fatigued during AM visit. PVCs frequent on telemetry.  -Volume: Patient appears to be relatively euvolemic. COPD likely contributing to respiratory distress Currently diuresing with furosemide  40 mg IV BID. Patient has developed contraction alkalosis with diuresis. Creatinine is stable, but BUN  is trending up. Consider transition to oral diuretics. -Hemodynamics: BP and HR stable this AM. -BB: Currently on metoprolol  tartrate 25 mg BID. Given COPD discussed with cardiology and will change to bisoprolol  5 mg daily.  -ACEI/ARB/ARNI: Currently on losartan  100 mg daily. May benefit from transition to Entresto .  -MRA: Continue spironolactone  25 mg daily. -SGLT2i: Continue Farxiga  10 mg daily.  Plan: 1) Medication changes recommended at this time: -Consider de-escalation to oral furosemide  40 mg  -Transitioned metoprolol  to bisoprolol  5 mg daily -Consider transition from losartan  to Entresto  tomorrow.  2) Patient assistance: -Copays for Farxiga , Jardiance, Entresto  are $0 -Patient reports she would appreciate mail order prescriptions from [facility]  3) Education: - Patient has been educated on current HF medications and potential additions to HF medication regimen - Patient verbalizes understanding that over the next few months, these medication doses may change and more medications may be added to optimize HF regimen - Patient has been educated on basic disease state pathophysiology and goals of therapy  Medication Assistance / Insurance Benefits Check: Does the patient have prescription insurance?    Type of insurance plan:  Does the patient qualify for medication assistance through manufacturers or grants? No   Outpatient Pharmacy: Prior to admission outpatient pharmacy: [facility]      Please do not hesitate to reach out with questions or concerns,  Jaun Bash, PharmD, CPP, BCPS, [facility] Heart Failure Pharmacist  Phone - [phone] 10/19/2023 9:28 AM

## 2023-10-19 NOTE — Progress Notes (Signed)
 [facility]  - [facility]   PATIENT NAME: Monica Stewart    MR#:  [id number]  DATE OF BIRTH:  [birth year]  SUBJECTIVE:  Granddaughter/ at bedside.  Patient improving. Weaned off BiPAP will use it as needed. Currently sats stable on 4 L nasal cannula oxygen . She tripods at baseline Was eating BF being fed by family  VITALS:  Blood pressure 109/73, pulse 95, temperature (!) 97.2 F (36.2 C), resp. rate (!) 21, height 5' 4 (1.626 m), weight 56.2 kg, SpO2 100%.  PHYSICAL EXAMINATION:   GENERAL:  78 y.o.-year-old patient with moderate acute distress. Frail, malnourished LUNGS: distant breath sounds bilaterally, no wheezing on [place] oxygen  CARDIOVASCULAR: S1, S2 normal. No murmur tachycardia ABDOMEN: Soft, nontender, nondistended.  EXTREMITIES: + edema b/l.    NEUROLOGIC: nonfocal  patient is alert and awake, anxious   LABORATORY PANEL:  CBC Recent Labs  Lab 10/19/23 0408  WBC 12.6*  HGB 11.9*  HCT 38.1  PLT 208    Chemistries  Recent Labs  Lab 10/16/23 0149 10/17/23 1015 10/18/23 0548 10/19/23 0408  NA 137   < > 141 141  K 4.4   < > 4.2 3.7  CL 90*   < > 91* 87*  CO2 35*   < > 37* 39*  GLUCOSE 140*   < > 103* 133*  BUN 22   < > 43* 48*  CREATININE 0.83   < > 0.91 0.87  CALCIUM  9.6   < > 9.5 10.2  MG  --   --  2.4  --   AST 148*  --   --   --   ALT 156*  --   --   --   ALKPHOS 56  --   --   --   BILITOT 0.5  --   --   --    < > = values in this interval not displayed.    Assessment and Plan  78 year old female with a past medical history of chronic respiratory failure on 3 L home oxygen , chronic obstructive pulmonary disorder FEV1 of 0.6 L, pulmonary nodule, gastroesophageal reflux disease who presents to the emergency department with increasing shortness of breath and dependent edema the patient is talking fluently on BiPAP reporting shortness of breath worsening since yesterday she reports increased sputum production and slightly  increased cough denies any fever or chills she also reports approximately 2 months of increasing dependent edema she does endorse orthopnea. Patient does not smoke currently however was a heavy smoker many years ago. She follows with Dr. Theotis of pulmonary as outpatient  Acute hypoxic Hypercapneic respiratory failure severe end-stage COPD/emphysema chronic respiratory failure oxygen  dependent 3 L at home/steroid dependent COPD elevated D dimer ?PE -- patient currently on BiPAP. She is using accessory muscles for breathing. -- Pulmonary  consultation with Dr. DELENA. -- Palliative care consultation -- patient has overall a poor prognosis -- IV Solu-Medrol , IV Lasix , nebulizer, BiPAP--changed to po lasix  and po steroid -- Xanax  PRN now able to take orally --pt will need NIV at discharge--TOC aware  Acute systolic congestive heart failure-- new atrial fibrillation new onset -- elevated BNP, orthopnea, pulmonary vascular congestion noted on chest x-ray and EF of 35 to 40% with severe global hypokinesis -- IV Lasix  40 mg BID -- currently on losartan  and metoprolol  if patient can tolerate PO --  cardiology consultation with [facility] clinic. -- Started on Spironolactone ,farxiga , losartan , metoprolol  -- per cardiology now on eliquis  --po lasix   Failure to  thrive/malnourished in the setting of severe COPD Difficulty swallowing --  ST input noted  Elevated D dimer/? suspected PE Atria fibrillation--new -- now on eliquis  -- patient not able to lay flat and unstable for CT scan  Hyperlipidemia -- statins   DM (diabetes mellitus), type 2 (HCC) Type 2 diabetes mellitus with anticipated steroid-induced hyperglycemia --Have placed on sliding scale insulin   FTT/moderate to severe malnutrition --RD to see    Palliative care consultation appreciated. PT/OT recommends rehab      Family communication :  granddaughter Consults : pulmonary CODE STATUS: FULL DVT Prophylaxis :  eliquis  Level of care: Progressive Status is: Inpatient Remains inpatient appropriate because: severe respiratory failure, CHF, FTT    TOTAL critical TIME TAKING CARE OF THIS PATIENT: 35 minutes.  >50% time spent on counselling and coordination of care  Note: This dictation was prepared with Dragon dictation along with smaller phrase technology. Any transcriptional errors that result from this process are unintentional.  Leita Blanch M.D    [facility]   CC: Primary care physician; Rudolpho Norleen BIRCH, MD

## 2023-10-19 NOTE — Progress Notes (Addendum)
 [facility] CLINIC CARDIOLOGY PROGRESS NOTE       Patient ID: Monica Stewart MRN: [id number] DOB/AGE: 76-Apr-1947 76 y.o.  Admit date: 10/15/2023 Referring Physician Dr. Tobie Primary Physician Rudolpho Norleen BIRCH, MD Primary Cardiologist Dr. Bosie (2015) Reason for Consultation Newly reduced EF  HPI: Monica Stewart is a 78 y.o. female  with a past medical history of hypertension, hyperlipidemia, chronic respiratory failure (3L), COPD, pulmonary nodule and patient reports hx of atrial flutter (years ago) who presented to the ED on 10/15/2023 for worsening fatigue, lower extremity swelling, fatigue for months.  Patient endorses orthopnea.  EKG on 07/21 and telemetry new onset atrial flutter relation.  Echo during this admission revealed a reduced EF 35-45%. Cardiology was consulted for further evaluation.   Interval History: -Patient seen and examined this AM and sitting in bedside chair with family at bedside.  Patient states a lot better today, states SOB is improving but not at baseline.  Needs to deny chest pain, palpitations or lightheadedness.  -LEE much improved. -Patients BP and HR stable this AM. Overnight Tele showed no significant events.  -Yesterday UOP 3.2L, with stable renal function. -Patient remains on 3L with stable SpO2. - Recommend patient continue to ambulate.   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   CARDIAC CATHETERIZATION     CATARACT EXTRACTION W/PHACO Right 01/23/2015   Procedure: CATARACT EXTRACTION PHACO AND INTRAOCULAR LENS PLACEMENT (IOC);  Surgeon: Newell Ovens, MD;  Location:  [facility] ORS;  Service: Ophthalmology;  Laterality: Right;  US             1.09 AP             17.7 CDE         12.16 casette lot # [id number]   EYE SURGERY     TUBAL LIGATION      Medications Prior to Admission  Medication Sig Dispense Refill Last Dose/Taking   albuterol  (VENTOLIN  HFA) 108 (90 Base) MCG/ACT inhaler Inhale 1-2 puffs into the lungs every 6 (six) hours as needed for wheezing or shortness of breath.   10/16/2023   alendronate (FOSAMAX) 70 MG tablet Take 70 mg by mouth once a week.   10/15/2023 Evening   amLODipine (NORVASC) 2.5 MG tablet Take 2.5 mg by mouth daily.   10/15/2023 Evening   Azelastine HCl 137 MCG/SPRAY SOLN Place 1 spray into both nostrils 2 (two) times daily.   10/15/2023 Evening   calcium  citrate-vitamin D  500-400 MG-UNIT chewable tablet Chew 1 tablet by mouth daily.    10/15/2023 Evening   Ferrous Fumarate (HEMOCYTE - 106 MG FE) 324 (106 Fe) MG TABS tablet Take 1 tablet by mouth every other day.   Unknown   fluticasone  (FLONASE ) 50 MCG/ACT nasal spray Place 2 sprays into both nostrils daily.   10/15/2023 Evening   furosemide  (LASIX ) 20 MG tablet Take 20 mg by mouth daily.   10/16/2023 Morning   Iron-Vitamins (GERITOL PO) Take 1 tablet by mouth daily.   Unknown   loratadine  (CLARITIN ) 10 MG tablet Take 10 mg  by mouth daily.   10/15/2023 Evening   losartan  (COZAAR ) 100 MG tablet Take 100 mg by mouth daily.   10/15/2023 Evening   lovastatin (MEVACOR) 40 MG tablet Take 40 mg by mouth at bedtime.   10/15/2023 Evening   meloxicam (MOBIC) 7.5 MG tablet Take 7.5 mg by mouth daily.   10/16/2023 Morning   Multiple Vitamins-Minerals (MULTIVITAMIN WITH MINERALS) tablet Take 1 tablet by mouth daily.   10/16/2023 Morning   NON FORMULARY Take 1 capsule by mouth daily.   Unknown   predniSONE  (DELTASONE ) 5 MG tablet Take 5 mg by mouth daily.   10/16/2023 Morning   Roflumilast 250 MCG TABS Take 250 mcg by mouth daily.   10/15/2023 Evening   TRELEGY ELLIPTA 100-62.5-25 MCG/INH AEPB Inhale 1 puff  into the lungs daily.   10/16/2023 Morning   hydrochlorothiazide  (HYDRODIURIL ) 25 MG tablet Take 25 mg by mouth daily. (Patient not taking: Reported on 10/16/2023)   Not Taking   Ipratropium-Albuterol  (COMBIVENT ) 20-100 MCG/ACT AERS respimat Inhale 1 puff into the lungs every 6 (six) hours for 7 days.  0    Social History   Socioeconomic History   Marital status: Divorced    Spouse name: Not on file   Number of children: Not on file   Years of education: Not on file   Highest education level: Not on file  Occupational History   Not on file  Tobacco Use   Smoking status: Former    Current packs/day: 0.00    Types: Cigarettes    Quit date: 01/22/1989    Years since quitting: 34.7   Smokeless tobacco: Never  Substance and Sexual Activity   Alcohol  use: No   Drug use: No   Sexual activity: Not on file  Other Topics Concern   Not on file  Social History Narrative   Not on file   Social Drivers of Health   Financial Resource Strain: Low Risk  (06/27/2023)   Received from [facility]   Overall Financial Resource Strain (CARDIA)    Difficulty of Paying Living Expenses: Not very hard  Food Insecurity: Food Insecurity Present (06/27/2023)   Received from [facility]   Hunger Vital Sign    Within the past 12 months, you worried that your food would run out before you got the money to buy more.: Never true    Within the past 12 months, the food you bought just didn't last and you didn't have money to get more.: Sometimes true  Transportation Needs: No Transportation Needs (06/27/2023)   Received from [facility] - Transportation    In the past 12 months, has lack of transportation kept you from medical appointments or from getting medications?: No    Lack of Transportation (Non-Medical): No  Physical Activity: Not on file  Stress: Not on file  Social Connections: Not on file  Intimate Partner Violence: Not on file     Family History  Problem Relation Age of Onset   Breast cancer Sister 75   Breast cancer Paternal Aunt      Vitals:   10/19/23 1140 10/19/23 1141 10/19/23 1147 10/19/23 1342  BP: 109/73     Pulse: 69  95   Resp: 14 (!) 21 (!) 21   Temp: (!) 97.2 F (36.2 C)     TempSrc:      SpO2:   100% 100%  Weight:      Height:  PHYSICAL EXAM General: Chronically ill appearing female, well nourished, in no acute distress. HEENT: Normocephalic and atraumatic. Neck: No JVD.   Lungs: Normal respiratory effort on Bipap. Diminished breath sounds bilaterally Heart: HRR, elevated HR. Normal S1 and S2 without gallops or murmurs.  Abdomen: Non-distended appearing.  Msk: Normal strength and tone for age. Extremities: Warm and well perfused. No clubbing, cyanosis. Trace pedal edema.  Neuro: Alert and oriented X 3. Psych: Answers questions appropriately.   Labs: Basic Metabolic Panel: Recent Labs    10/18/23 0548 10/19/23 0408  NA 141 141  K 4.2 3.7  CL 91* 87*  CO2 37* 39*  GLUCOSE 103* 133*  BUN 43* 48*  CREATININE 0.91 0.87  CALCIUM  9.5 10.2  MG 2.4  --   PHOS 4.5  --    Liver Function Tests: No results for input(s): AST, ALT, ALKPHOS, BILITOT, PROT, ALBUMIN in the last 72 hours.  No results for input(s): LIPASE, AMYLASE in the last 72 hours. CBC: Recent Labs    10/18/23 0548 10/19/23 0408  WBC 10.9* 12.6*  HGB 11.4* 11.9*  HCT 35.4* 38.1  MCV 91.2 93.6  PLT 201 208   Cardiac Enzymes: No results for input(s): CKTOTAL, CKMB, CKMBINDEX, TROPONINIHS in the last 72 hours.  BNP: No results for input(s): BNP in the last 72 hours.  D-Dimer: No results for input(s): DDIMER in the last 72 hours.  Hemoglobin A1C: No results for input(s): HGBA1C in the last 72 hours.  Fasting Lipid Panel: No results for input(s): CHOL, HDL, LDLCALC, TRIG, CHOLHDL, LDLDIRECT in the last 72 hours. Thyroid Function Tests: No results for  input(s): TSH, T4TOTAL, T3FREE, THYROIDAB in the last 72 hours.  Invalid input(s): FREET3  Anemia Panel: No results for input(s): VITAMINB12, FOLATE, FERRITIN, TIBC, IRON, RETICCTPCT in the last 72 hours.   Radiology: ECHOCARDIOGRAM COMPLETE Result Date: 10/16/2023    ECHOCARDIOGRAM REPORT   Patient Name:   TAYJAH LOBDELL Date of Exam: 10/16/2023 Medical Rec #:  [id number]       Height:       64.0 in Accession #:    [id number]      Weight:       112.2 lb Date of Birth:  Oct 25, 1945       BSA:          1.530 m Patient Age:    77 years        BP:           137/88 mmHg Patient Gender: F               HR:           115 bpm. Exam Location:  [facility] Procedure: 2D Echo, 3D Echo, Cardiac Doppler, Color Doppler and Strain Analysis            (Both Spectral and Color Flow Doppler were utilized during            procedure). Indications:     Elevated Troponin  History:         Patient has prior history of Echocardiogram examinations, most                  recent 05/03/2023.  Sonographer:     Thedora Louder RDCS, FASE Referring Phys:  [id number] PRENTICE BROCKS CORE Diagnosing Phys: Cara JONETTA Lovelace MD  Sonographer Comments: Global longitudinal strain was attempted. IMPRESSIONS  1. Left ventricular ejection fraction, by estimation, is 35 to 40%. The left ventricle has moderately decreased function.  The left ventricle demonstrates global hypokinesis. The left ventricular internal cavity size was mildly dilated. Left ventricular diastolic function could not be evaluated. The average left ventricular global longitudinal strain is 11.1 %. The global longitudinal strain is abnormal.  2. Right ventricular systolic function is low normal. The right ventricular size is mildly enlarged.  3. The mitral valve is normal in structure. Trivial mitral valve regurgitation.  4. The aortic valve is normal in structure. Aortic valve regurgitation is not visualized. Aortic valve sclerosis is present, with no evidence of aortic valve  stenosis. FINDINGS  Left Ventricle: Left ventricular ejection fraction, by estimation, is 35 to 40%. The left ventricle has moderately decreased function. The left ventricle demonstrates global hypokinesis. The average left ventricular global longitudinal strain is 11.1 %.  Strain was performed and the global longitudinal strain is abnormal. The left ventricular internal cavity size was mildly dilated. There is no left ventricular hypertrophy. Left ventricular diastolic function could not be evaluated. Right Ventricle: The right ventricular size is mildly enlarged. No increase in right ventricular wall thickness. Right ventricular systolic function is low normal. Left Atrium: Left atrial size was normal in size. Right Atrium: Right atrial size was normal in size. Pericardium: There is no evidence of pericardial effusion. Mitral Valve: The mitral valve is normal in structure. Trivial mitral valve regurgitation. Tricuspid Valve: The tricuspid valve is normal in structure. Tricuspid valve regurgitation is mild. Aortic Valve: The aortic valve is normal in structure. Aortic valve regurgitation is not visualized. Aortic valve sclerosis is present, with no evidence of aortic valve stenosis. Aortic valve peak gradient measures 5.8 mmHg. Pulmonic Valve: The pulmonic valve was normal in structure. Pulmonic valve regurgitation is not visualized. Aorta: The ascending aorta was not well visualized. IAS/Shunts: No atrial level shunt detected by color flow Doppler. Additional Comments: 3D was performed not requiring image post processing on an independent workstation and was abnormal.  LEFT VENTRICLE PLAX 2D LVIDd:         2.75 cm     Diastology LVIDs:         3.60 cm     LV e' medial:    19.00 cm/s LV PW:         1.00 cm     LV E/e' medial:  5.6 LV IVS:        3.15 cm     LV e' lateral:   13.80 cm/s LVOT diam:     1.80 cm     LV E/e' lateral: 7.7 LV SV:         45 LV SV Index:   29          2D Longitudinal Strain LVOT Area:      2.54 cm    2D Strain GLS (A4C):   10.2 %                            2D Strain GLS (A3C):   11.4 %                            2D Strain GLS (A2C):   11.7 % LV Volumes (MOD)           2D Strain GLS Avg:     11.1 % LV vol d, MOD A2C: 68.8 ml LV vol d, MOD A4C: 81.0 ml LV vol s, MOD A2C: 42.5 ml LV vol s, MOD A4C: 47.9 ml  3D Volume EF: LV SV MOD A2C:     26.3 ml 3D EF:        40 % LV SV MOD A4C:     81.0 ml LV EDV:       102 ml LV SV MOD BP:      29.1 ml LV ESV:       61 ml                            LV SV:        41 ml RIGHT VENTRICLE RV Basal diam:  3.00 cm RV S prime:     14.90 cm/s TAPSE (M-mode): 2.0 cm LEFT ATRIUM             Index        RIGHT ATRIUM           Index LA diam:        3.20 cm 2.09 cm/m   RA Area:     10.30 cm LA Vol (A2C):   51.0 ml 33.33 ml/m  RA Volume:   26.00 ml  16.99 ml/m LA Vol (A4C):   35.8 ml 23.39 ml/m LA Biplane Vol: 44.5 ml 29.08 ml/m  AORTIC VALVE                 PULMONIC VALVE AV Area (Vmax): 2.23 cm     PV Vmax:        0.87 m/s AV Vmax:        120.00 cm/s  PV Peak grad:   3.0 mmHg AV Peak Grad:   5.8 mmHg     RVOT Peak grad: 2 mmHg LVOT Vmax:      105.00 cm/s LVOT Vmean:     65.800 cm/s LVOT VTI:       0.176 m  AORTA Ao Root diam: 3.40 cm MITRAL VALVE MV Area (PHT): 6.37 cm     SHUNTS MV Decel Time: 119 msec     Systemic VTI:  0.18 m MV E velocity: 106.00 cm/s  Systemic Diam: 1.80 cm Cara JONETTA Lovelace MD Electronically signed by Cara JONETTA Lovelace MD Signature Date/Time: 10/16/2023/9:49:51 AM    Final    DG Chest Portable 1 View Result Date: 10/15/2023 CLINICAL DATA:  COPD exacerbation EXAM: PORTABLE CHEST - 1 VIEW COMPARISON:  02/23/2020 FINDINGS: Pulmonary hyperinflation with attenuated bronchovascular markings in both upper lobes. No focal airspace disease. Mild interstitial prominence in the lung bases, increased from previous. Heart size and mediastinal contours are within normal limits. Aortic Atherosclerosis (ICD10-170.0). Mild blunting of the lateral costophrenic  angles. Visualized bones unremarkable. IMPRESSION: 1. Pulmonary hyperinflation with mild bibasilar interstitial prominence. 2. Possible small pleural effusions. Electronically Signed   By: JONETTA Faes M.D.   On: 10/15/2023 21:15    ECHO as above  TELEMETRY reviewed by me 10/19/2023: sinus rhythm, rate 90s  EKG reviewed by me: atrial flutter, rate 158 bpm (new onset).  Data reviewed by me 10/19/2023: last 24h vitals tele labs imaging I/O hospitalist progress notes.  Principal Problem:   Acute respiratory failure (HCC) Active Problems:   COPD with acute exacerbation (HCC)   Acute pulmonary embolism (HCC)   DM (diabetes mellitus), type 2 (HCC)   Acute on chronic diastolic CHF (congestive heart failure) (HCC)   Non-ischemic cardiomyopathy (HCC)   Sinus tachycardia   Transaminitis   HTN (hypertension)   HLD (hyperlipidemia)   Osteoporosis   Acute clinical systolic heart failure (  HCC)   Palliative care encounter    ASSESSMENT AND PLAN:  VERDINE GRENFELL is a 78 y.o. female  with a past medical history of hypertension, hyperlipidemia, chronic respiratory failure (3L), COPD, pulmonary nodule and patient reports hx of atrial flutter (years ago) who presented to the ED on 10/15/2023 for worsening fatigue, lower extremity swelling, fatigue for months.  Patient endorses orthopnea.  EKG on 07/21 and telemetry new onset atrial flutter relation.  Echo during this admission revealed a reduced EF 35-45%. Cardiology was consulted for further evaluation.  # New onset HFrEF # Acute on chronic respiratory failure # COPD exacerbation BNP elevated at 1100.  Chest x-ray with pulmonary vascular congestion.  This admission reveals newly reduced EF of 35-40% with global hypokinesis. - Transition IV to p.o. Lasix  40 mg daily. - Continue dapagliflozin  10 mg daily. (Copay $0) - Decreased losartan  to 50 mg daily due to borderline BP. - Transitioned metoprolol  to bisoprolol  5 mg daily due to possible bronchospasm  given COPD. - Continue spironolactone  25 mg daily.  # New onset Atrial fibrillation/flutter RVR EKG (07/21) with atrial flutter, rate 158 bpm (new onset). Per tele episode atrial fibrillation RVR. Per tele now remains in sinus tachycardia rate 100s.  -Continue Eliquis  5 mg twice daily for stroke risk reduction.  CHA2DS2-VASc score is at least 6. Patient denies any recent falls, or prior major bleeding.  -Bisoprolol  as stated above.  Uptitrate for elevated heart rate if BP allows.  # Hypertension # Hyperlipidemia Troponins minimally elevated and flat 33 > 48. EKG without acute ischemic changes. BP stable. -Continue losartan , bisoprolol  as stated above.  This patient's plan of care was discussed and created with Dr. Ammon and he is in agreement.  Signed: Dorene Comfort, PA-C  10/19/2023, 2:46 PM [facility]

## 2023-10-19 NOTE — Progress Notes (Signed)
 Initial Nutrition Assessment  DOCUMENTATION CODES:   Severe malnutrition in context of chronic illness  INTERVENTION:   Boost Plus chocolate po BID- Each supplement provides 360kcal and 14g protein.    Magic cup TID with meals, each supplement provides 290 kcal and 9 grams of protein  MVI po daily   Pt at high refeed risk; recommend monitor potassium, magnesium  and phosphorus labs daily until stable  Daily weights   NUTRITION DIAGNOSIS:   Severe Malnutrition related to chronic illness as evidenced by severe fat depletion, severe muscle depletion.  GOAL:   Patient will meet greater than or equal to 90% of their needs  MONITOR:   PO intake, Supplement acceptance, Labs, Weight trends, I & O's, Skin  REASON FOR ASSESSMENT:   Consult Assessment of nutrition requirement/status  ASSESSMENT:   78 y/o female with h/o COPD, DM, NICM, HTN, HLD, asthma and GERD who is admitted with COPD exacerbation, new onset A flutter and new onset CHF.  Met with pt and pt's grandson in room today. Pt in good spirits, sitting up in bed and is on bipap. Pt is SOB but is able to provide history. Pt reports good appetite and oral intake at baseline. Pt reports her oral intake has been decreased in hospital r/t SOB. Pt documented to be eating bites of meals over the past several days but is documented to have eaten 100% of lunch. Pt reports her appetite is improved today and reports that she is eating multiple small meals throughout the day. Pt reports eating four ice creams today. Pt reports that she has been drinking one Boost per day brought from home. RD will change pt from Ensure to Boost Plus as pt prefers this better. RD discussed with pt the importance of adequate nutrition needed to preserve lean muscle. Pt remains at refeed risk. Pt reports significant weight loss over the last several years. Per chart, pt does appear weight stable over the past year. Palliative care is following.    Medications  reviewed and include: oscal w/ D, lasix , insulin , MVI, aldactone   Labs reviewed: K 3.7 wnl, BUN 48(H) P 4.5 wnl, Mg 2.4 wnl- 7/22 Wbc- 12.6(H) Cbgs- 135, 106, 221 x 24 hrs  AIC 6.7(H)- 7/20  NUTRITION - FOCUSED PHYSICAL EXAM:  Flowsheet Row Most Recent Value  Orbital Region Moderate depletion  Upper Arm Region Severe depletion  Thoracic and Lumbar Region Severe depletion  Buccal Region Moderate depletion  Temple Region Moderate depletion  Clavicle Bone Region Severe depletion  Clavicle and Acromion Bone Region Severe depletion  Scapular Bone Region Moderate depletion  Dorsal Hand Severe depletion  Patellar Region Severe depletion  Anterior Thigh Region Severe depletion  Posterior Calf Region Severe depletion  Edema (RD Assessment) Mild  Hair Reviewed  Eyes Reviewed  Mouth Reviewed  Skin Reviewed  Nails Reviewed   Diet Order:   Diet Order             DIET DYS 2 Fluid consistency: Thin  Diet effective now                  EDUCATION NEEDS:   Education needs have been addressed  Skin:  Skin Assessment: Reviewed RN Assessment  Last BM:  7/20- type 7  Height:   Ht Readings from Last 1 Encounters:  10/16/23 5' 4 (1.626 m)    Weight:   Wt Readings from Last 1 Encounters:  10/19/23 56.2 kg    Ideal Body Weight:  54.5 kg  BMI:  Body  mass index is 21.27 kg/m.  Estimated Nutritional Needs:   Kcal:  1400-1600kcal/day  Protein:  70-80g/day  Fluid:  1.3-1.5L/day  Augustin Shams MS, RD, LDN If unable to be reached, please send secure chat to RD inpatient available from 8:00a-4:00p daily

## 2023-10-19 NOTE — Progress Notes (Signed)
 PULMONOLOGY         Date: 10/19/2023,   MRN# [id number] Monica Stewart 1945-07-13     AdmissionWeight: 50.9 kg                 CurrentWeight: 56.2 kg  Referring provider: Dr Core   CHIEF COMPLAINT:    Acute on chronic hypoxemic and hypercapnic respiratory failure  HISTORY OF PRESENT ILLNESS   This is a 78 yo F with stage 4 COPD and chronic hypoxemia on 3L/min [place] and recurrent hypercapnic encephalopathy.  Daughter reports courses of confusion at home even at rest. She is weak with deconditioning with baseline mMRC at 4.  She also has advanced Systolic CHF with EF <35% And a list of additional comorbidities. She came in to ER with worsening respiratory distress.  She has been compliant with combination ICS/LABA/LAMA at home with Trelegy once dialy. She also takes chronic prednisone  5mg  and Daliresp . She required BIPAP on arrival and is in respiratory distress. ABG with severe hypercapnic hypoxemic resp failure.  PCCM consultation for further evaluation and management.   10/19/23- patient seen at bedside, grandson is present during interview and examination.  She is sitting up in chair on nasal canula in NAD tripoding on 4L/min [place].  Was ambulatory in room.  ABG was done yesterday with persistent hypercapnic respiratory failure.  She has PT/OT and is slowly improving towards dc home. She will need NIV upon discharge.  Prednisone  reduced to 35mg  today.   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   CARDIAC CATHETERIZATION     CATARACT EXTRACTION W/PHACO Right 01/23/2015   Procedure: CATARACT EXTRACTION PHACO AND  INTRAOCULAR LENS PLACEMENT (IOC);  Surgeon: Newell Ovens, MD;  Location: [facility] ORS;  Service: Ophthalmology;  Laterality: Right;  US             1.09 AP             17.7 CDE         12.16 casette lot # [id number]   EYE SURGERY     TUBAL LIGATION       FAMILY HISTORY   Family History  Problem Relation Age of Onset   Breast cancer Sister 58   Breast cancer Paternal Aunt      SOCIAL HISTORY   Social History   Tobacco Use   Smoking status: Former    Current packs/day: 0.00    Types: Cigarettes    Quit date: 01/22/1989    Years since quitting: 34.7   Smokeless tobacco: Never  Substance Use Topics   Alcohol  use: No   Drug use: No     MEDICATIONS    Home Medication:    Current Medication:  Current Facility-Administered Medications:    acetaminophen  (TYLENOL ) tablet 650 mg, 650 mg, Oral, Q6H PRN **OR** acetaminophen  (TYLENOL ) suppository 650 mg, 650 mg, Rectal, Q6H PRN, Core, Prentice BROCKS, MD   albuterol  (PROVENTIL ) (2.5 MG/3ML) 0.083% nebulizer solution 2.5 mg, 2.5 mg, Nebulization, Q2H PRN, Core, Prentice BROCKS, MD, 2.5 mg at 10/16/23 1106   ALPRAZolam  (  XANAX ) tablet 0.25 mg, 0.25 mg, Oral, TID PRN, Patel, Sona, MD, 0.25 mg at 10/19/23 0605   apixaban  (ELIQUIS ) tablet 5 mg, 5 mg, Oral, BID, Decoste, Gabriella, PA-C, 5 mg at 10/19/23 1000   bisoprolol  (ZEBETA ) tablet 5 mg, 5 mg, Oral, Daily, Decoste, Gabriella, PA-C, 5 mg at 10/19/23 1001   calcium -vitamin D  (OSCAL WITH D) 500-5 MG-MCG per tablet 1 tablet, 1 tablet, Oral, Daily, Core, Prentice BROCKS, MD, 1 tablet at 10/19/23 1001   Chlorhexidine  Gluconate Cloth 2 % PADS 6 each, 6 each, Topical, Daily, Tobie Calix, MD, 6 each at 10/19/23 1003   dapagliflozin  propanediol (FARXIGA ) tablet 10 mg, 10 mg, Oral, Daily, Decoste, Gabriella, PA-C, 10 mg at 10/19/23 1001   feeding supplement (ENSURE PLUS HIGH PROTEIN) liquid 237 mL, 237 mL, Oral, BID BM, Patel, Sona, MD   fluticasone  (FLONASE ) 50 MCG/ACT nasal spray 2 spray, 2 spray, Each Nare,  Daily, Core, Prentice BROCKS, MD, 2 spray at 10/19/23 1003   furosemide  (LASIX ) tablet 40 mg, 40 mg, Oral, Daily, Decoste, Gabriella, PA-C   insulin  aspart (novoLOG ) injection 0-5 Units, 0-5 Units, Subcutaneous, QHS, Patel, Sona, MD   insulin  aspart (novoLOG ) injection 0-9 Units, 0-9 Units, Subcutaneous, TID WC, Patel, Sona, MD, 2 Units at 10/18/23 1700   levalbuterol  (XOPENEX ) nebulizer solution 0.63 mg, 0.63 mg, Nebulization, Q6H, Patel, Sona, MD, 0.63 mg at 10/19/23 9260   losartan  (COZAAR ) tablet 100 mg, 100 mg, Oral, Daily, Core, Prentice BROCKS, MD, 100 mg at 10/19/23 1000   multivitamin with minerals tablet 1 tablet, 1 tablet, Oral, Daily, Core, Prentice BROCKS, MD, 1 tablet at 10/19/23 1001   ondansetron  (ZOFRAN ) tablet 4 mg, 4 mg, Oral, Q6H PRN **OR** ondansetron  (ZOFRAN ) injection 4 mg, 4 mg, Intravenous, Q6H PRN, Core, Prentice BROCKS, MD   polyethylene glycol (MIRALAX  / GLYCOLAX ) packet 17 g, 17 g, Oral, Daily PRN, Core, Prentice BROCKS, MD   [COMPLETED] methylPREDNISolone  sodium succinate (SOLU-MEDROL ) 40 mg/mL injection 40 mg, 40 mg, Intravenous, Q12H, 40 mg at 10/16/23 2110 **FOLLOWED BY** predniSONE  (DELTASONE ) tablet 40 mg, 40 mg, Oral, Q breakfast, Patel, Sona, MD, 40 mg at 10/19/23 1001   spironolactone  (ALDACTONE ) tablet 25 mg, 25 mg, Oral, Daily, Decoste, Gabriella, PA-C, 25 mg at 10/19/23 1000   umeclidinium-vilanterol (ANORO ELLIPTA ) 62.5-25 MCG/ACT 1 puff, 1 puff, Inhalation, Daily, Core, Prentice BROCKS, MD, 1 puff at 10/19/23 1002    ALLERGIES   Lisinopril-hydrochlorothiazide      REVIEW OF SYSTEMS    Review of Systems:  Gen:  Denies  fever, sweats, chills weigh loss  HEENT: Denies blurred vision, double vision, ear pain, eye pain, hearing loss, nose bleeds, sore throat Cardiac:  No dizziness, chest pain or heaviness, chest tightness,edema Resp:   reports dyspnea chronically  Gi: Denies swallowing difficulty, stomach pain, nausea or vomiting, diarrhea, constipation, bowel incontinence Gu:  Denies  bladder incontinence, burning urine Ext:   Denies Joint pain, stiffness or swelling Skin: Denies  skin rash, easy bruising or bleeding or hives Endoc:  Denies polyuria, polydipsia , polyphagia or weight change Psych:   Denies depression, insomnia or hallucinations   Other:  All other systems negative   VS: BP 109/73 (BP Location: Right Arm)   Pulse 95   Temp (!) 97.2 F (36.2 C)   Resp (!) 21   Ht 5' 4 (1.626 m)   Wt 56.2 kg   SpO2 100%   BMI 21.27 kg/m      PHYSICAL EXAM    GENERAL:NAD, no fevers, chills, no weakness  no fatigue HEAD: Normocephalic, atraumatic.  EYES: Pupils equal, round, reactive to light. Extraocular muscles intact. No scleral icterus.  MOUTH: Moist mucosal membrane. Dentition intact. No abscess noted.  EAR, NOSE, THROAT: Clear without exudates. No external lesions.  NECK: Supple. No thyromegaly. No nodules. No JVD.  PULMONARY: decreased breath sounds with mild rhonchi worse at bases bilaterally.  CARDIOVASCULAR: S1 and S2. Regular rate and rhythm. No murmurs, rubs, or gallops. No edema. Pedal pulses 2+ bilaterally.  GASTROINTESTINAL: Soft, nontender, nondistended. No masses. Positive bowel sounds. No hepatosplenomegaly.  MUSCULOSKELETAL: No swelling, clubbing, or edema. Range of motion full in all extremities.  NEUROLOGIC: Cranial nerves II through XII are intact. No gross focal neurological deficits. Sensation intact. Reflexes intact.  SKIN: No ulceration, lesions, rashes, or cyanosis. Skin warm and dry. Turgor intact.  PSYCHIATRIC: Mood, affect within normal limits. The patient is awake, alert and oriented x 3. Insight, judgment intact.       IMAGING   CLINICAL DATA: COPD exacerbation  EXAM: PORTABLE CHEST - 1 VIEW  COMPARISON: 02/23/2020  FINDINGS: Pulmonary hyperinflation with attenuated bronchovascular markings in both upper lobes. No focal airspace disease. Mild interstitial prominence in the lung bases, increased from  previous.  Heart size and mediastinal contours are within normal limits. Aortic Atherosclerosis (ICD10-170.0).  Mild blunting of the lateral costophrenic angles.  Visualized bones unremarkable.  IMPRESSION: 1. Pulmonary hyperinflation with mild bibasilar interstitial prominence. 2. Possible small pleural effusions.   Electronically Signed By: JONETTA Faes M.D. On: 10/15/2023 21:15   ASSESSMENT/PLAN   Acute on chronic hypoxemic hypercapnic respiratory failure    -due to Severe Acute COPD exacerbation    -possible culprit is viral vs bacterial infection     - CRP trend    - procalcitonin trend    - respiratory culture    - resp viral panel    - COVID/FLU/RSV negative    - continue COPD care path with steroids, nebs, abx    - IS as able , flutter as able     - repeat CXR in 48hr    -legionella and strep pneumoniae testing    -BODE score >8 with <20% long term survival    - palliative care consultation for possible comfort care / hospice evaluation    -pred reduced to 35mg    Hypercapnic respiratory failure with Encephalopathy    - continue BIPAP     - repeat VBG in 6 hr   Bibasilar atelelctasis      Chest physiotherapy      - continue BIPAP      - PT/OT     Advanced Systolic CHF      - continue diuresis prn      - trans thoracic echo with both left and right heart dysfunction - ddimer to rule out PE      - LVEF <35%      Thank you for allowing me to participate in the care of this patient.   Patient/Family are satisfied with care plan and all questions have been answered.    Provider disclosure: Patient with at least one acute or chronic illness or injury that poses a threat to life or bodily function and is being managed actively during this encounter.  All of the below services have been performed independently by signing provider:  review of prior documentation from internal and or external health records.  Review of previous and current lab results.   Interview and comprehensive assessment during patient visit today. Review of current and  previous chest radiographs/CT scans. Discussion of management and test interpretation with health care team and patient/family.   This document was prepared using Dragon voice recognition software and may include unintentional dictation errors.     Rebakah Cokley, M.D.  Division of Pulmonary & Critical Care Medicine

## 2023-10-19 NOTE — Progress Notes (Signed)
 OT Cancellation Note  Patient Details Name: Monica Stewart MRN: [id number] DOB: 1945-09-11   Cancelled Treatment:    Reason Eval/Treat Not Completed: Fatigue/lethargy limiting ability to participate Pt received in recliner, appearing generally unwell with increased work of breathing. Pt declines OT at this time due to fatigue. OT offers repositioning in chair for comfort with pt declining. RN updated on pt's status. OT will continue to follow and see when appropriate.   Kesean Serviss L. Willian Donson, OTR/L  10/19/23, 11:53 AM

## 2023-10-19 NOTE — Progress Notes (Signed)
 Physical Therapy Treatment Patient Details Name: Monica Stewart MRN: [id number] DOB: 1945/10/31 Today's Date: 10/19/2023   History of Present Illness Monica Stewart is a 78 y.o. female  with a past medical history of hypertension, hyperlipidemia, chronic respiratory failure (3L), COPD, pulmonary nodule and patient reports hx of atrial flutter (years ago) who presented to the ED on 10/15/2023 for worsening fatigue, lower extremity swelling, fatigue for months. Admitted for mgmt of COPD and acute on chronic respiratory failure    PT Comments  Today's tx involved continuation of transfer work with incorporating ambulation to recliner. Pt SpO2 vitals stable throughout session on 4L [place], staying between 98%-100%. Bed mobility and STS transfer with CGA but pt continues to be visibly fatigued with any sort of mobility, requiring rest breaks in between transitions. Pt assisted with cleaning after BM in bed then safely ambulated to recliner. Pt is overall progressing toward meeting goals and will continue to benefit from skilled PT interventions. PT to follow acutely as appropriate.      If plan is discharge home, recommend the following: A lot of help with walking and/or transfers;A lot of help with bathing/dressing/bathroom;Assist for transportation;Help with stairs or ramp for entrance   Can travel by private vehicle     Yes  Equipment Recommendations  None recommended by PT    Recommendations for Other Services       Precautions / Restrictions Precautions Precautions: Fall Recall of Precautions/Restrictions: Intact Restrictions Weight Bearing Restrictions Per Provider Order: No     Mobility  Bed Mobility Overal bed mobility: Needs Assistance Bed Mobility: Supine to Sit     Supine to sit: Contact guard     General bed mobility comments: CGA, increased time and effort    Transfers Overall transfer level: Needs assistance Equipment used: Rolling walker (2 wheels) Transfers: Sit  to/from Stand Sit to Stand: Contact guard assist, Min assist           General transfer comment: CGA to min a due to assistance with BM clean up, use of RW, no LOB experienced    Ambulation/Gait Ambulation/Gait assistance: Contact guard assist Gait Distance (Feet): 5 Feet Assistive device: Rolling walker (2 wheels) Gait Pattern/deviations: Step-to pattern, Decreased step length - right, Decreased step length - left       General Gait Details: step to pattern with RW towards recliner, no LOB, effortful and pt easily fatigues but able to participate more when compared to last session   Stairs             Wheelchair Mobility     Tilt Bed    Modified Rankin (Stroke Patients Only)       Balance Overall balance assessment: Needs assistance Sitting-balance support: Single extremity supported, Feet supported Sitting balance-Leahy Scale: Fair Sitting balance - Comments: sititng EOB   Standing balance support: Bilateral upper extremity supported Standing balance-Leahy Scale: Fair Standing balance comment: standing using RW, fatigues quickly with unsteady BLEs                            Communication Communication Communication: No apparent difficulties  Cognition Arousal: Alert Behavior During Therapy: WFL for tasks assessed/performed   PT - Cognitive impairments: No apparent impairments                         Following commands: Intact      Cueing Cueing Techniques: Verbal cues, Tactile cues  Exercises  General Comments        Pertinent Vitals/Pain Pain Assessment Pain Assessment: No/denies pain    Home Living                          Prior Function            PT Goals (current goals can now be found in the care plan section) Acute Rehab PT Goals Patient Stated Goal: to get stronger and return home PT Goal Formulation: With patient/family Time For Goal Achievement: 11/01/23 Potential to Achieve Goals:  Good Progress towards PT goals: Progressing toward goals    Frequency    Min 2X/week      PT Plan      Co-evaluation     PT goals addressed during session: Mobility/safety with mobility;Strengthening/ROM;Proper use of DME;Balance        AM-PAC PT 6 Clicks Mobility   Outcome Measure  Help needed turning from your back to your side while in a flat bed without using bedrails?: A Little Help needed moving from lying on your back to sitting on the side of a flat bed without using bedrails?: A Little Help needed moving to and from a bed to a chair (including a wheelchair)?: A Little Help needed standing up from a chair using your arms (e.g., wheelchair or bedside chair)?: A Little Help needed to walk in hospital room?: A Lot Help needed climbing 3-5 steps with a railing? : A Lot 6 Click Score: 16    End of Session Equipment Utilized During Treatment: Oxygen  Activity Tolerance: Patient tolerated treatment well Patient left: in chair;with call bell/phone within reach;with chair alarm set;with family/visitor present Nurse Communication: Mobility status PT Visit Diagnosis: Unsteadiness on feet (R26.81);Other abnormalities of gait and mobility (R26.89);Muscle weakness (generalized) (M62.81)     Time: 8953-8890 PT Time Calculation (min) (ACUTE ONLY): 23 min  Charges:                               Tiffny Gemmer Romero-Perozo, SPT  10/19/2023, 11:19 AM

## 2023-10-19 NOTE — Progress Notes (Signed)
 Speech Language Pathology Treatment: Dysphagia  Patient Details Name: Monica Stewart MRN: [id number] DOB: 23-Jul-1945 Today's Date: 10/19/2023 Time: 1400-1500 SLP Time Calculation (min) (ACUTE ONLY): 60 min  Assessment / Plan / Recommendation Clinical Impression  Pt seen for ongoing assessment of swallowing; education on general aspiration precautions and strategies for conservation of energy during oral intake. Pt has been off BiPAP more during the day w/ less anxiety about needing to use per her report; NSG report. She was alert, verbally responsive and engaged in this tx session w/ this SLP; Family and NSG present. Min; increased WOB/effort on O2 support- Baseline, Chronic Pulmonary decline.  On 4L [place] O2 support, afebrile. WBC slightly elevated.    Pt explained general aspiration precautions and agreed verbally to the need for following them especially sitting upright for all oral intake and taking Rest Breaks during oral intake w/ careful monitoring of her breathing during the exertion of eating/drinking. She consumed thin liquids via cup w/ no overt clinical s/s of aspiration noted during intake; respiratory presentation remained sustained at her Baseline w/ min increased WOB. Rest Breaks and strategies for conservation of energy were discussed; education and examples given/modeled. Pt continues w/ Medications CRUSHED in Puree -- the Puree providing cohesion for swallowing. Oral phase appeared Monica Stewart for bolus management and timely A-P transfer for swallowing.  OF NOTE: this morning during the breakfast meal, pt exhibited coughing while eating eggs/sausage. Pt explained that she was talking, someone was asking me a lot of questions and standing there waiting for me to answer and when I did, I choked on my food. [name] respiratory status recovered afterwards per NSG; she did not require BiPAP per NSG. Discussed behavioral options and strategies she could chose vs talking during oral intake.    Pt  appears at reduced risk for aspiration when following general aspiration precautions and using strategies to promote safer swallowing during eating/drinking, especially strategies focusing on conservation of energy in setting of her Chronic, declined Pulmonary status and requiring O2 support at Baseline. Recommend continue a Dysphagia level 2 (MINCED foods) diet for ease and less exertion of mastication, w/ gravies added to moisten foods; Thin liquids via Cup. Less Straw use d/t Pulmonary status. Recommend general aspiration precautions- NO TALKING during eating/drinking; Pills CRUSHED in Puree; tray setup and positioning assistance for meals. REFLUX precautions.  ST services can be available for further education while admitted and at her next venue of care. Precautions posted in room; Family given handout on Dysphagia level 2 diet. MD/NSG updated.       HPI HPI: Per H&P: 78 year old female with a past medical history of Chronic respiratory failure on 3 L home oxygen , chronic obstructive pulmonary disorder FEV1 of 0.6 L, pulmonary nodule, gastroesophageal reflux disease who presents to the emergency department with increasing shortness of breath and dependent edema the patient is talking fluently on BiPAP reporting shortness of breath worsening since yesterday she reports increased sputum production and slightly increased cough denies any fever or chills she also reports approximately 2 months of increasing dependent Edema she does endorse orthopnea but no paroxysmal nocturnal dyspnea.  She denies any recent medication changes reports compliance to her medications including her chronic prednisone  5 mg daily.  The patient denies any chest pain or any palpitations.  DG Chest 7/19:  Pulmonary hyperinflation with mild bibasilar interstitial  prominence.  2. Possible small pleural effusions.      SLP Plan  Continue with current plan of care  Recommendations  Diet recommendations: Dysphagia 2  (fine chop);Thin liquid (added gravies; purees) Liquids provided via: Cup;No straw Medication Administration: Crushed with puree (vs Whole in Puree) Supervision: Patient able to self feed;Staff to assist with self feeding;Intermittent supervision to cue for compensatory strategies Compensations: Minimize environmental distractions;Slow rate;Small sips/bites;Lingual sweep for clearance of pocketing;Multiple dry swallows after each bite/sip;Follow solids with liquid (Rest Breaks to calm breathing when needed) Postural Changes and/or Swallow Maneuvers: Out of bed for meals;Seated upright 90 degrees;Upright 30-60 min after meal                Rehab consult (PT/OT) Oral care BID;Oral care before and after PO;Staff/trained caregiver to provide oral care (support pt)   Intermittent Supervision/Assistance Dysphagia, unspecified (R13.10) (impacted by declined Pulmonary functioning- Baseline)     Continue with current plan of care      Comer Portugal, MS, CCC-SLP Speech Language Pathologist Rehab Services; [facility] [phone] (ascom) Mazzie Brodrick  10/19/2023, 5:58 PM

## 2023-10-19 NOTE — Progress Notes (Addendum)
 Daily Progress Note   Patient Name: Monica Stewart       Date: 10/19/2023 DOB: 12-12-1945  Age: 78 y.o. MRN#: [id number] Attending Physician: Tobie Calix, MD Primary Care Physician: Rudolpho Norleen BIRCH, MD Admit Date: 10/15/2023  Reason for Consultation/Follow-up: Establishing goals of care  Subjective: Notes and labs reviewed.  Case reviewed with attending.  In to see patient.  She is currently sitting in bed at this time with a family member present.  She is currently on the nasal cannula and satting 99%.  She is tachycardic with heart rate is between 120s and 150 during my visit.  She discusses updates she has been provided with me; cardiology following this. Patient states she continues to feel better and is hopeful to be ready to discharge soon.  Patient maintains desire for full code and full scope.  Length of Stay: 3  Current Medications: Scheduled Meds:   apixaban   5 mg Oral BID   bisoprolol   5 mg Oral Daily   calcium -vitamin D   1 tablet Oral Daily   Chlorhexidine  Gluconate Cloth  6 each Topical Daily   dapagliflozin  propanediol  10 mg Oral Daily   feeding supplement  237 mL Oral BID BM   fluticasone   2 spray Each Nare Daily   furosemide   40 mg Oral Daily   insulin  aspart  0-5 Units Subcutaneous QHS   insulin  aspart  0-9 Units Subcutaneous TID WC   levalbuterol   0.63 mg Nebulization Q6H   losartan   100 mg Oral Daily   multivitamin with minerals  1 tablet Oral Daily   predniSONE   40 mg Oral Q breakfast   spironolactone   25 mg Oral Daily   umeclidinium-vilanterol  1 puff Inhalation Daily    Continuous Infusions:   PRN Meds: acetaminophen  **OR** acetaminophen , albuterol , ALPRAZolam , ondansetron  **OR** ondansetron  (ZOFRAN ) IV, polyethylene glycol  Physical Exam Pulmonary:      Effort: Pulmonary effort is normal.  Skin:    General: Skin is warm and dry.  Neurological:     Mental Status: She is alert.             Vital Signs: BP 109/73 (BP Location: Right Arm)   Pulse 95   Temp (!) 97.2 F (36.2 C)   Resp (!) 21   Ht 5' 4 (1.626 m)  Wt 56.2 kg   SpO2 100%   BMI 21.27 kg/m  SpO2: SpO2: 100 % O2 Device: O2 Device: Nasal Cannula O2 Flow Rate: O2 Flow Rate (L/min): 4 L/min  Intake/output summary:  Intake/Output Summary (Last 24 hours) at 10/19/2023 1214 Last data filed at 10/19/2023 0330 Gross per 24 hour  Intake --  Output 2400 ml  Net -2400 ml   LBM: Last BM Date : 10/18/23 Baseline Weight: Weight: 50.9 kg Most recent weight: Weight: 56.2 kg        Patient Active Problem List   Diagnosis Date Noted   Acute respiratory failure (HCC) 10/16/2023   Acute pulmonary embolism (HCC) 10/16/2023   DM (diabetes mellitus), type 2 (HCC) 10/16/2023   Acute on chronic diastolic CHF (congestive heart failure) (HCC) 10/16/2023   Non-ischemic cardiomyopathy (HCC) 10/16/2023   Sinus tachycardia 10/16/2023   Transaminitis 10/16/2023   HTN (hypertension) 10/16/2023   HLD (hyperlipidemia) 10/16/2023   Osteoporosis 10/16/2023   Acute clinical systolic heart failure (HCC) 10/16/2023   Palliative care encounter 10/16/2023   COPD with acute exacerbation (HCC) 02/23/2020    Palliative Care Assessment & Plan    Recommendations/Plan:  Continue full code and full scope care  Code Status:    Code Status Orders  (From admission, onward)           Start     Ordered   10/16/23 2027  Full code  (Code Status)  Continuous       Question:  By:  Answer:  Consent: discussion documented in EHR   10/16/23 2026           Code Status History     Date Active Date Inactive Code Status Order ID Comments User Context   10/16/2023 0000 10/16/2023 2026 Limited: Do not attempt resuscitation (DNR) -DNR-LIMITED -Do Not Intubate/DNI  [id number]  Core, Prentice BROCKS, MD  ED   02/23/2020 1410 02/25/2020 1956 Full Code [id number]  Acheampong, Maude POUR, MD ED       Thank you for allowing the Palliative Medicine Team to assist in the care of this patient.    Camelia Lewis, NP  Please contact Palliative Medicine Team phone at [phone] for questions and concerns.

## 2023-10-19 NOTE — TOC Progression Note (Signed)
 Transition of Care [facility]) - Progression Note    Patient Details  Name: Monica Stewart MRN: [id number] Date of Birth: 12/24/1945  [facility] CM/SW Contact  Tomasa JAYSON Childes, RN Phone Number: 10/19/2023, 3:45 PM  Clinical Narrative:   Spoke with patient's daughter Nat at the bedside regarding therapy's recommendation for SNF. Patient would prefer to go home but is agreeable to SNF. Nat would like a search to include [facility], [facility], [facility] and [facility]. Nat was advised insurance shara will have to be approved for SNF stay.                         Expected Discharge Plan and Services                                               Social Drivers of Health (SDOH) Interventions SDOH Screenings   Food Insecurity: Food Insecurity Present (06/27/2023)   Received from [facility]  Housing: Low Risk  (06/27/2023)   Received from [facility]  Transportation Needs: No Transportation Needs (06/27/2023)   Received from [facility]  Utilities: Not At Risk (06/27/2023)   Received from [facility]  Financial Resource Strain: Low Risk  (06/27/2023)   Received from [facility]  Tobacco Use: Medium Risk (10/15/2023)    Readmission Risk Interventions     No data to display

## 2023-10-20 DIAGNOSIS — J441 Chronic obstructive pulmonary disease with (acute) exacerbation: Secondary | ICD-10-CM | POA: Diagnosis not present

## 2023-10-20 DIAGNOSIS — E43 Unspecified severe protein-calorie malnutrition: Secondary | ICD-10-CM | POA: Insufficient documentation

## 2023-10-20 DIAGNOSIS — J9601 Acute respiratory failure with hypoxia: Secondary | ICD-10-CM | POA: Diagnosis not present

## 2023-10-20 DIAGNOSIS — I428 Other cardiomyopathies: Secondary | ICD-10-CM | POA: Diagnosis not present

## 2023-10-20 DIAGNOSIS — I5021 Acute systolic (congestive) heart failure: Secondary | ICD-10-CM | POA: Diagnosis not present

## 2023-10-20 LAB — BASIC METABOLIC PANEL WITH GFR
Anion gap: 12 (ref 5–15)
BUN: 41 mg/dL — ABNORMAL HIGH (ref 8–23)
CO2: 41 mmol/L — ABNORMAL HIGH (ref 22–32)
Calcium: 10.6 mg/dL — ABNORMAL HIGH (ref 8.9–10.3)
Chloride: 89 mmol/L — ABNORMAL LOW (ref 98–111)
Creatinine, Ser: 0.62 mg/dL (ref 0.44–1.00)
GFR, Estimated: >60 mL/min (ref >60)
Glucose, Bld: 126 mg/dL — ABNORMAL HIGH (ref 70–99)
Potassium: 5 mmol/L (ref 3.5–5.1)
Sodium: 142 mmol/L (ref 135–145)

## 2023-10-20 LAB — GLUCOSE, CAPILLARY
Glucose-Capillary: 124 mg/dL — ABNORMAL HIGH (ref 70–99)
Glucose-Capillary: 126 mg/dL — ABNORMAL HIGH (ref 70–99)
Glucose-Capillary: 179 mg/dL — ABNORMAL HIGH (ref 70–99)
Glucose-Capillary: 181 mg/dL — ABNORMAL HIGH (ref 70–99)
Glucose-Capillary: 194 mg/dL — ABNORMAL HIGH (ref 70–99)
Glucose-Capillary: 203 mg/dL — ABNORMAL HIGH (ref 70–99)

## 2023-10-20 LAB — HEPATIC FUNCTION PANEL
ALT: 113 U/L — ABNORMAL HIGH (ref 0–44)
AST: 50 U/L — ABNORMAL HIGH (ref 15–41)
Albumin: 3.2 g/dL — ABNORMAL LOW (ref 3.5–5.0)
Alkaline Phosphatase: 66 U/L (ref 38–126)
Bilirubin, Direct: <0.1 mg/dL (ref 0.0–0.2)
Total Bilirubin: 0.4 mg/dL (ref 0.0–1.2)
Total Protein: 6.2 g/dL — ABNORMAL LOW (ref 6.5–8.1)

## 2023-10-20 LAB — PROCALCITONIN: Procalcitonin: 0.11 ng/mL

## 2023-10-20 MED ORDER — SACUBITRIL-VALSARTAN 24-26 MG PO TABS
1.0000 | ORAL_TABLET | Freq: Two times a day (BID) | ORAL | Status: DC
  Administered 2023-10-21 – 2023-10-22 (×3): 1 via ORAL
  Filled 2023-10-20 (×3): qty 1

## 2023-10-20 MED ORDER — BISOPROLOL FUMARATE 5 MG PO TABS: 7.5000 mg | ORAL_TABLET | Freq: Every day | ORAL | Status: DC

## 2023-10-20 MED ORDER — AMIODARONE HCL 200 MG PO TABS: 200.0000 mg | ORAL_TABLET | Freq: Every day | ORAL | Status: DC

## 2023-10-20 MED ORDER — AMIODARONE HCL 200 MG PO TABS
400.0000 mg | ORAL_TABLET | Freq: Two times a day (BID) | ORAL | Status: DC
  Administered 2023-10-20 – 2023-10-25 (×11): 400 mg via ORAL
  Filled 2023-10-20 (×11): qty 2

## 2023-10-20 MED ORDER — BISOPROLOL FUMARATE 5 MG PO TABS
5.0000 mg | ORAL_TABLET | Freq: Every day | ORAL | Status: DC
  Administered 2023-10-21 – 2023-10-22 (×2): 5 mg via ORAL
  Filled 2023-10-20 (×3): qty 1

## 2023-10-20 NOTE — Progress Notes (Signed)
 [facility]  - [place] at [facility]   PATIENT NAME: Monica Stewart    MR#:  [id number]  DATE OF BIRTH:  03/07/1946  SUBJECTIVE:  [place]/ at bedside. Spoke with dter Nat on the phone Patient improving. Weaned off BiPAP will use it as needed. Currently sats stable on 4 L nasal cannula oxygen .  Ate good BF today  VITALS:  Blood pressure 118/78, pulse 94, temperature 98.6 F (37 C), resp. rate 17, height 5' 4 (1.626 m), weight 52.9 kg, SpO2 99%.  PHYSICAL EXAMINATION:   GENERAL:  78 y.o.-year-old patient with moderate acute distress. Frail, malnourished LUNGS: distant breath sounds bilaterally, no wheezing on [place] oxygen  CARDIOVASCULAR: S1, S2 normal. No murmur tachycardia ABDOMEN: Soft, nontender, nondistended.  EXTREMITIES: no edema b/l.    NEUROLOGIC: nonfocal  patient is alert and awake, anxious   LABORATORY PANEL:  CBC Recent Labs  Lab 10/19/23 0408  WBC 12.6*  HGB 11.9*  HCT 38.1  PLT 208    Chemistries  Recent Labs  Lab 10/16/23 0149 10/17/23 1015 10/18/23 0548 10/19/23 0408 10/20/23 0431  NA 137   < > 141   < > 142  K 4.4   < > 4.2   < > 5.0  CL 90*   < > 91*   < > 89*  CO2 35*   < > 37*   < > 41*  GLUCOSE 140*   < > 103*   < > 126*  BUN 22   < > 43*   < > 41*  CREATININE 0.83   < > 0.91   < > 0.62  CALCIUM  9.6   < > 9.5   < > 10.6*  MG  --   --  2.4  --   --   AST 148*  --   --   --   --   ALT 156*  --   --   --   --   ALKPHOS 56  --   --   --   --   BILITOT 0.5  --   --   --   --    < > = values in this interval not displayed.    Assessment and Plan  78 year old female with a past medical history of chronic respiratory failure on 3 L home oxygen , chronic obstructive pulmonary disorder FEV1 of 0.6 L, pulmonary nodule, gastroesophageal reflux disease who presents to the emergency department with increasing shortness of breath and dependent edema the patient is talking fluently on BiPAP reporting shortness of breath worsening  since yesterday she reports increased sputum production and slightly increased cough denies any fever or chills she also reports approximately 2 months of increasing dependent edema she does endorse orthopnea. Patient does not smoke currently however was a heavy smoker many years ago. She follows with Dr. Theotis of pulmonary as outpatient  Acute hypoxic Hypercapneic respiratory failure severe end-stage COPD/emphysema chronic respiratory failure oxygen  dependent 3 L at home/steroid dependent COPD elevated D dimer ?PE -- patient currently on BiPAP. She is using accessory muscles for breathing. -- Pulmonary  consultation with Dr. DELENA. -- Palliative care consultation noted -- IV Solu-Medrol , IV Lasix , nebulizer, BiPAP--changed to po lasix  and po steroid -- Xanax  PRN now able to take orally --pt will need NIV at discharge--TOC aware  Acute systolic congestive heart failure-- new atrial fibrillation new onset -- elevated BNP, orthopnea, pulmonary vascular congestion noted on chest x-ray and EF of 35 to 40% with severe global hypokinesis --  IV Lasix  40 mg BID -- currently on losartan  and metoprolol  if patient can tolerate PO --  cardiology consultation with [facility]. -- Started on Spironolactone ,farxiga , losartan ,bisoprolol  -- per cardiology now on eliquis  --po lasix  --cardiac monitor to be placed prior to d/c per cardiology PA  Failure to thrive/malnourished in the setting of severe COPD Difficulty swallowing --  ST input noted  Elevated D dimer/? suspected PE Atria fibrillation--new -- now on eliquis   Hyperlipidemia -- statins   DM (diabetes mellitus), type 2 (HCC) Type 2 diabetes mellitus with anticipated steroid-induced hyperglycemia --Have placed on sliding scale insulin   FTT/moderate to severe malnutrition --RD to see    Palliative care consultation appreciated. PT/OT recommends rehab TOC for d/c planning to rehab with NIV      Family communication :Nat on  the phone Consults : pulmonary, cardiology CODE STATUS: FULL DVT Prophylaxis : eliquis  Level of care: Progressive Status is: Inpatient Remains inpatient appropriate because: awaitng rehab beds and NIV    TOTAL critical TIME TAKING CARE OF THIS PATIENT: 35 minutes.  >50% time spent on counselling and coordination of care  Note: This dictation was prepared with Dragon dictation along with smaller phrase technology. Any transcriptional errors that result from this process are unintentional.  Leita Blanch M.D    [facility]   CC: Primary care physician; Rudolpho Norleen BIRCH, MD

## 2023-10-20 NOTE — Plan of Care (Signed)

## 2023-10-20 NOTE — Progress Notes (Addendum)
 [facility] CLINIC CARDIOLOGY PROGRESS NOTE       Patient ID: Monica Stewart MRN: [id number] DOB/AGE: Sep 27, 1945 78 y.o.  Admit date: 10/15/2023 Referring Physician Dr. Tobie Primary Physician Rudolpho Norleen BIRCH, MD Primary Cardiologist Dr. Bosie (2015) Reason for Consultation Newly reduced EF  HPI: Monica Stewart is a 78 y.o. female  with a past medical history of hypertension, hyperlipidemia, chronic respiratory failure (3L), COPD, pulmonary nodule and patient reports hx of atrial flutter (years ago) who presented to the ED on 10/15/2023 for worsening fatigue, lower extremity swelling, fatigue for months.  Patient endorses orthopnea.  EKG on 07/21 and telemetry new onset atrial flutter relation.  Echo during this admission revealed a reduced EF 35-45%. Cardiology was consulted for further evaluation.   Interval History: -Patient seen and examined this AM and sitting in bedside chair with family at bedside. Patient states she continues to feel better each day. States SOB is improving.  Continues to deny chest pain, palpitations or lightheadedness.  -LEE much improved. -Patients BP and HR stable this AM. Per tele showed a few events of AF/AFL RVR last night.  -Patient remains on 3L with stable SpO2. -Recommend patient continue to work with PT.    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   CARDIAC CATHETERIZATION     CATARACT EXTRACTION W/PHACO Right 01/23/2015   Procedure: CATARACT EXTRACTION PHACO AND INTRAOCULAR LENS PLACEMENT (IOC);  Surgeon: Newell Ovens, MD;  Location: [facility] ORS;  Service:  Ophthalmology;  Laterality: Right;  US             1.09 AP             17.7 CDE         12.16 casette lot # [id number]   EYE SURGERY     TUBAL LIGATION      Medications Prior to Admission  Medication Sig Dispense Refill Last Dose/Taking   albuterol  (VENTOLIN  HFA) 108 (90 Base) MCG/ACT inhaler Inhale 1-2 puffs into the lungs every 6 (six) hours as needed for wheezing or shortness of breath.   10/16/2023   alendronate (FOSAMAX) 70 MG tablet Take 70 mg by mouth once a week.   10/15/2023 Evening   amLODipine (NORVASC) 2.5 MG tablet Take 2.5 mg by mouth daily.   10/15/2023 Evening   Azelastine HCl 137 MCG/SPRAY SOLN Place 1 spray into both nostrils 2 (two) times daily.   10/15/2023 Evening   calcium  citrate-vitamin D  500-400 MG-UNIT chewable tablet Chew 1 tablet by mouth daily.    10/15/2023 Evening   Ferrous Fumarate (HEMOCYTE - 106 MG FE) 324 (106 Fe) MG TABS tablet Take 1 tablet by mouth every other day.   Unknown   fluticasone  (FLONASE ) 50 MCG/ACT nasal spray Place 2 sprays into both nostrils daily.   10/15/2023 Evening   furosemide  (LASIX ) 20 MG tablet Take 20 mg by mouth daily.   10/16/2023 Morning   Iron-Vitamins (GERITOL PO) Take 1 tablet by mouth daily.   Unknown   loratadine  (CLARITIN ) 10 MG tablet Take 10 mg by mouth  daily.   10/15/2023 Evening   losartan  (COZAAR ) 100 MG tablet Take 100 mg by mouth daily.   10/15/2023 Evening   lovastatin (MEVACOR) 40 MG tablet Take 40 mg by mouth at bedtime.   10/15/2023 Evening   meloxicam (MOBIC) 7.5 MG tablet Take 7.5 mg by mouth daily.   10/16/2023 Morning   Multiple Vitamins-Minerals (MULTIVITAMIN WITH MINERALS) tablet Take 1 tablet by mouth daily.   10/16/2023 Morning   NON FORMULARY Take 1 capsule by mouth daily.   Unknown   predniSONE  (DELTASONE ) 5 MG tablet Take 5 mg by mouth daily.   10/16/2023 Morning   Roflumilast 250 MCG TABS Take 250 mcg by mouth daily.   10/15/2023 Evening   TRELEGY ELLIPTA 100-62.5-25 MCG/INH AEPB Inhale 1 puff into the lungs daily.    10/16/2023 Morning   hydrochlorothiazide  (HYDRODIURIL ) 25 MG tablet Take 25 mg by mouth daily. (Patient not taking: Reported on 10/16/2023)   Not Taking   Ipratropium-Albuterol  (COMBIVENT ) 20-100 MCG/ACT AERS respimat Inhale 1 puff into the lungs every 6 (six) hours for 7 days.  0    Social History   Socioeconomic History   Marital status: Divorced    Spouse name: Not on file   Number of children: Not on file   Years of education: Not on file   Highest education level: Not on file  Occupational History   Not on file  Tobacco Use   Smoking status: Former    Current packs/day: 0.00    Types: Cigarettes    Quit date: 01/22/1989    Years since quitting: 34.7   Smokeless tobacco: Never  Substance and Sexual Activity   Alcohol  use: No   Drug use: No   Sexual activity: Not on file  Other Topics Concern   Not on file  Social History Narrative   Not on file   Social Drivers of Health   Financial Resource Strain: Low Risk  (06/27/2023)   Received from [facility]   Overall Financial Resource Strain (CARDIA)    Difficulty of Paying Living Expenses: Not very hard  Food Insecurity: Food Insecurity Present (06/27/2023)   [facility] from [facility]   Hunger Vital Sign    Within the past 12 months, you worried that your food would run out before you got the money to buy more.: Never true    Within the past 12 months, the food you bought just didn't last and you didn't have money to get more.: Sometimes true  Transportation Needs: No Transportation Needs (06/27/2023)   Received from [facility] - Transportation    In the past 12 months, has lack of transportation kept you from medical appointments or from getting medications?: No    Lack of Transportation (Non-Medical): No  Physical Activity: Not on file  Stress: Not on file  Social Connections: Not on file  Intimate Partner Violence: Not on file    Family History  Problem  Relation Age of Onset   Breast cancer Sister 21   Breast cancer Paternal Aunt      Vitals:   10/20/23 0435 10/20/23 0500 10/20/23 0727 10/20/23 1057  BP:   (!) 128/91 118/78  Pulse:   92 94  Resp:   14 17  Temp: 98.2 F (36.8 C)  97.7 F (36.5 C) 98.6 F (37 C)  TempSrc: Oral     SpO2:   96% 99%  Weight:  52.9 kg    Height:  PHYSICAL EXAM General: Chronically ill appearing female, well nourished, in no acute distress. HEENT: Normocephalic and atraumatic. Neck: No JVD.   Lungs: Normal respiratory effort on 3L. Diminished breath sounds bilaterally Heart: HRR, elevated HR. Normal S1 and S2 without gallops or murmurs.  Abdomen: Non-distended appearing.  Msk: Normal strength and tone for age. Extremities: Warm and well perfused. No clubbing, cyanosis, edema.  Neuro: Alert and oriented X 3. Psych: Answers questions appropriately.   Labs: Basic Metabolic Panel: Recent Labs    10/18/23 0548 10/19/23 0408 10/20/23 0431  NA 141 141 142  K 4.2 3.7 5.0  CL 91* 87* 89*  CO2 37* 39* 41*  GLUCOSE 103* 133* 126*  BUN 43* 48* 41*  CREATININE 0.91 0.87 0.62  CALCIUM  9.5 10.2 10.6*  MG 2.4  --   --   PHOS 4.5  --   --    Liver Function Tests: No results for input(s): AST, ALT, ALKPHOS, BILITOT, PROT, ALBUMIN in the last 72 hours.  No results for input(s): LIPASE, AMYLASE in the last 72 hours. CBC: Recent Labs    10/18/23 0548 10/19/23 0408  WBC 10.9* 12.6*  HGB 11.4* 11.9*  HCT 35.4* 38.1  MCV 91.2 93.6  PLT 201 208   Cardiac Enzymes: No results for input(s): CKTOTAL, CKMB, CKMBINDEX, TROPONINIHS in the last 72 hours.  BNP: No results for input(s): BNP in the last 72 hours.  D-Dimer: No results for input(s): DDIMER in the last 72 hours.  Hemoglobin A1C: No results for input(s): HGBA1C in the last 72 hours.  Fasting Lipid Panel: No results for input(s): CHOL, HDL, LDLCALC, TRIG, CHOLHDL, LDLDIRECT in the last 72  hours. Thyroid Function Tests: No results for input(s): TSH, T4TOTAL, T3FREE, THYROIDAB in the last 72 hours.  Invalid input(s): FREET3  Anemia Panel: No results for input(s): VITAMINB12, FOLATE, FERRITIN, TIBC, IRON, RETICCTPCT in the last 72 hours.   Radiology: ECHOCARDIOGRAM COMPLETE Result Date: 10/16/2023    ECHOCARDIOGRAM REPORT   Patient Name:   Monica Stewart Date of Exam: 10/16/2023 Medical Rec #:  [id number]       Height:       64.0 in Accession #:    [id number]      Weight:       112.2 lb Date of Birth:  Nov 14, 1945       BSA:          1.530 m Patient Age:    77 years        BP:           137/88 mmHg Patient Gender: F               HR:           115 bpm. Exam Location:  [facility] Procedure: 2D Echo, 3D Echo, Cardiac Doppler, Color Doppler and Strain Analysis            (Both Spectral and Color Flow Doppler were utilized during            procedure). Indications:     Elevated Troponin  History:         Patient has prior history of Echocardiogram examinations, most                  recent 05/03/2023.  Sonographer:     Thedora Louder RDCS, FASE Referring Phys:  [id number] PRENTICE BROCKS CORE Diagnosing Phys: Cara JONETTA Lovelace MD  Sonographer Comments: Global longitudinal strain was attempted. IMPRESSIONS  1. Left ventricular ejection  fraction, by estimation, is 35 to 40%. The left ventricle has moderately decreased function. The left ventricle demonstrates global hypokinesis. The left ventricular internal cavity size was mildly dilated. Left ventricular diastolic function could not be evaluated. The average left ventricular global longitudinal strain is 11.1 %. The global longitudinal strain is abnormal.  2. Right ventricular systolic function is low normal. The right ventricular size is mildly enlarged.  3. The mitral valve is normal in structure. Trivial mitral valve regurgitation.  4. The aortic valve is normal in structure. Aortic valve regurgitation is not visualized. Aortic valve  sclerosis is present, with no evidence of aortic valve stenosis. FINDINGS  Left Ventricle: Left ventricular ejection fraction, by estimation, is 35 to 40%. The left ventricle has moderately decreased function. The left ventricle demonstrates global hypokinesis. The average left ventricular global longitudinal strain is 11.1 %.  Strain was performed and the global longitudinal strain is abnormal. The left ventricular internal cavity size was mildly dilated. There is no left ventricular hypertrophy. Left ventricular diastolic function could not be evaluated. Right Ventricle: The right ventricular size is mildly enlarged. No increase in right ventricular wall thickness. Right ventricular systolic function is low normal. Left Atrium: Left atrial size was normal in size. Right Atrium: Right atrial size was normal in size. Pericardium: There is no evidence of pericardial effusion. Mitral Valve: The mitral valve is normal in structure. Trivial mitral valve regurgitation. Tricuspid Valve: The tricuspid valve is normal in structure. Tricuspid valve regurgitation is mild. Aortic Valve: The aortic valve is normal in structure. Aortic valve regurgitation is not visualized. Aortic valve sclerosis is present, with no evidence of aortic valve stenosis. Aortic valve peak gradient measures 5.8 mmHg. Pulmonic Valve: The pulmonic valve was normal in structure. Pulmonic valve regurgitation is not visualized. Aorta: The ascending aorta was not well visualized. IAS/Shunts: No atrial level shunt detected by color flow Doppler. Additional Comments: 3D was performed not requiring image post processing on an independent workstation and was abnormal.  LEFT VENTRICLE PLAX 2D LVIDd:         2.75 cm     Diastology LVIDs:         3.60 cm     LV e' medial:    19.00 cm/s LV PW:         1.00 cm     LV E/e' medial:  5.6 LV IVS:        3.15 cm     LV e' lateral:   13.80 cm/s LVOT diam:     1.80 cm     LV E/e' lateral: 7.7 LV SV:         45 LV SV  Index:   29          2D Longitudinal Strain LVOT Area:     2.54 cm    2D Strain GLS (A4C):   10.2 %                            2D Strain GLS (A3C):   11.4 %                            2D Strain GLS (A2C):   11.7 % LV Volumes (MOD)           2D Strain GLS Avg:     11.1 % LV vol d, MOD A2C: 68.8 ml LV vol d, MOD A4C: 81.0 ml  LV vol s, MOD A2C: 42.5 ml LV vol s, MOD A4C: 47.9 ml 3D Volume EF: LV SV MOD A2C:     26.3 ml 3D EF:        40 % LV SV MOD A4C:     81.0 ml LV EDV:       102 ml LV SV MOD BP:      29.1 ml LV ESV:       61 ml                            LV SV:        41 ml RIGHT VENTRICLE RV Basal diam:  3.00 cm RV S prime:     14.90 cm/s TAPSE (M-mode): 2.0 cm LEFT ATRIUM             Index        RIGHT ATRIUM           Index LA diam:        3.20 cm 2.09 cm/m   RA Area:     10.30 cm LA Vol (A2C):   51.0 ml 33.33 ml/m  RA Volume:   26.00 ml  16.99 ml/m LA Vol (A4C):   35.8 ml 23.39 ml/m LA Biplane Vol: 44.5 ml 29.08 ml/m  AORTIC VALVE                 PULMONIC VALVE AV Area (Vmax): 2.23 cm     PV Vmax:        0.87 m/s AV Vmax:        120.00 cm/s  PV Peak grad:   3.0 mmHg AV Peak Grad:   5.8 mmHg     RVOT Peak grad: 2 mmHg LVOT Vmax:      105.00 cm/s LVOT Vmean:     65.800 cm/s LVOT VTI:       0.176 m  AORTA Ao Root diam: 3.40 cm MITRAL VALVE MV Area (PHT): 6.37 cm     SHUNTS MV Decel Time: 119 msec     Systemic VTI:  0.18 m MV E velocity: 106.00 cm/s  Systemic Diam: 1.80 cm Cara JONETTA Lovelace MD Electronically signed by Cara JONETTA Lovelace MD Signature Date/Time: 10/16/2023/9:49:51 AM    Final    DG Chest Portable 1 View Result Date: 10/15/2023 CLINICAL DATA:  COPD exacerbation EXAM: PORTABLE CHEST - 1 VIEW COMPARISON:  02/23/2020 FINDINGS: Pulmonary hyperinflation with attenuated bronchovascular markings in both upper lobes. No focal airspace disease. Mild interstitial prominence in the lung bases, increased from previous. Heart size and mediastinal contours are within normal limits. Aortic Atherosclerosis  (ICD10-170.0). Mild blunting of the lateral costophrenic angles. Visualized bones unremarkable. IMPRESSION: 1. Pulmonary hyperinflation with mild bibasilar interstitial prominence. 2. Possible small pleural effusions. Electronically Signed   By: JONETTA Faes M.D.   On: 10/15/2023 21:15    ECHO as above  TELEMETRY reviewed by me 10/20/2023: sinus rhythm with PVCs, rate 90s (Per tele a few episodes of AF/AFL RVR last night and early this morning)  EKG reviewed by me: atrial flutter, rate 158 bpm (new onset).  Data reviewed by me 10/20/2023: last 24h vitals tele labs imaging I/O hospitalist progress notes.  Principal Problem:   Acute respiratory failure (HCC) Active Problems:   COPD with acute exacerbation (HCC)   Acute pulmonary embolism (HCC)   DM (diabetes mellitus), type 2 (HCC)   Acute on chronic diastolic CHF (congestive heart failure) (HCC)  Non-ischemic cardiomyopathy (HCC)   Sinus tachycardia   Transaminitis   HTN (hypertension)   HLD (hyperlipidemia)   Osteoporosis   Acute clinical systolic heart failure (HCC)   Palliative care encounter   Protein-calorie malnutrition, severe    ASSESSMENT AND PLAN:  Monica Stewart is a 78 y.o. female  with a past medical history of hypertension, hyperlipidemia, chronic respiratory failure (3L), COPD, pulmonary nodule and patient reports hx of atrial flutter (years ago) who presented to the ED on 10/15/2023 for worsening fatigue, lower extremity swelling, fatigue for months.  Patient endorses orthopnea.  EKG on 07/21 and telemetry new onset atrial flutter relation.  Echo during this admission revealed a reduced EF 35-45%. Cardiology was consulted for further evaluation.  # New onset HFrEF # Acute on chronic respiratory failure # COPD exacerbation BNP elevated at 1100.  Chest x-ray with pulmonary vascular congestion.  This admission reveals newly reduced EF of 35-40% with global hypokinesis. - Continue p.o. Lasix  40 mg daily. - Continue  dapagliflozin  10 mg daily. (Copay $0) - Transition losartan  50 mg daily to Entresto  24-26 mg BID tomorrow. (Copay $0) - Continue bisoprolol  5 mg daily. Will hold off on uptitarte, due to concern for low CO. - Continue spironolactone  25 mg daily. -Pulmonology following and managing COPD/Lung disease.   # New onset Atrial fibrillation/flutter RVR # Paroxsymal atrial fibrillation EKG (07/21) with atrial flutter, rate 158 bpm (new onset). Per tele episode atrial fibrillation RVR. Per tele now remains in sinus tachycardia rate 100s. Per tele a few episodes of AF/AFL RVR last night and early this morning (07/23 & 07/24) -Continue Eliquis  5 mg twice daily for stroke risk reduction.  CHA2DS2-VASc score is at least 6. Patient denies any recent falls, or prior major bleeding.  -Continue bisoprolol  as stated above. -Rhythm control medications limited due to severe underlying lung disease. -Ordered Amio 400 mg BID for 10 days, then 200 mg daily for rhythm control. Not a long term option due to patients severe COPD. Consulted pulmonology and states no evidence of pulmonary fibrosis and gave okay to start Amio. -Will consider cardiac monitor to assess AF/AFL, PVC burden.   # Hypertension # Hyperlipidemia Troponins minimally elevated and flat 33 > 48. EKG without acute ischemic changes. BP stable. -Continue losartan , bisoprolol  as stated above.  This patient's plan of care was discussed and created with Dr. Ammon and he is in agreement.  Signed: Dorene Comfort, PA-C  10/20/2023, 11:06 AM [facility] Cardiology

## 2023-10-20 NOTE — NC FL2 (Signed)
 [place]  MEDICAID FL2 LEVEL OF CARE FORM     IDENTIFICATION  Patient Name: Monica Stewart Birthdate: 04/04/1945 Sex: female Admission Date (Current Location): 10/15/2023  [facility] and [place] Number:  Chiropodist and Address:  [facility], [address] [place], [place] [address]      Provider Number: [id number]  Attending Physician Name and Address:  Tobie Calix, MD  Relative Name and Phone Number:  Jama Garre  [phone]    Current Level of Care: Hospital Recommended Level of Care: Skilled Nursing Facility Prior Approval Number:    Date Approved/Denied:   PASRR Number: [id number]  Discharge Plan: SNF    Current Diagnoses: Patient Active Problem List   Diagnosis Date Noted   Protein-calorie malnutrition, severe 10/20/2023   Acute respiratory failure (HCC) 10/16/2023   Acute pulmonary embolism (HCC) 10/16/2023   DM (diabetes mellitus), type 2 (HCC) 10/16/2023   Acute on chronic diastolic CHF (congestive heart failure) (HCC) 10/16/2023   Non-ischemic cardiomyopathy (HCC) 10/16/2023   Sinus tachycardia 10/16/2023   Transaminitis 10/16/2023   HTN (hypertension) 10/16/2023   HLD (hyperlipidemia) 10/16/2023   Osteoporosis 10/16/2023   Acute clinical systolic heart failure (HCC) 10/16/2023   Palliative care encounter 10/16/2023   COPD with acute exacerbation (HCC) 02/23/2020    Orientation RESPIRATION BLADDER Height & Weight     Self, Time, Situation, Place  O2 (O22L per nasal cannula) External catheter, Incontinent Weight: 52.9 kg Height:  5' 4 (162.6 cm)  BEHAVIORAL SYMPTOMS/MOOD NEUROLOGICAL BOWEL NUTRITION STATUS      Continent Diet  AMBULATORY STATUS COMMUNICATION OF NEEDS Skin   Limited Assist Verbally Other (Comment) (Erythema to buttocks)                       Personal Care Assistance Level of Assistance  Bathing, Feeding, Dressing Bathing Assistance: Limited assistance Feeding assistance: Limited  assistance Dressing Assistance: Limited assistance     Functional Limitations Info  Sight, Hearing Sight Info: Impaired Hearing Info: Adequate      SPECIAL CARE FACTORS FREQUENCY  PT (By licensed PT), OT (By licensed OT)     PT Frequency: Min 2x weekly OT Frequency: Min 2x weekly            Contractures Contractures Info: Not present    Additional Factors Info  Code Status, Allergies Code Status Info: FULL Allergies Info: Lisinopril-hydrochlorothiazide            Current Medications (10/20/2023):  This is the current hospital active medication list Current Facility-Administered Medications  Medication Dose Route Frequency Provider Last Rate Last Admin   acetaminophen  (TYLENOL ) tablet 650 mg  650 mg Oral Q6H PRN Core, Prentice BROCKS, MD       Or   acetaminophen  (TYLENOL ) suppository 650 mg  650 mg Rectal Q6H PRN Core, Prentice BROCKS, MD       albuterol  (PROVENTIL ) (2.5 MG/3ML) 0.083% nebulizer solution 2.5 mg  2.5 mg Nebulization Q2H PRN Core, Prentice BROCKS, MD   2.5 mg at 10/16/23 1106   ALPRAZolam  (XANAX ) tablet 0.25 mg  0.25 mg Oral TID PRN Patel, Sona, MD   0.25 mg at 10/19/23 1405   apixaban  (ELIQUIS ) tablet 5 mg  5 mg Oral BID Decoste, Gabriella, PA-C   5 mg at 10/20/23 0957   [START ON 10/21/2023] bisoprolol  (ZEBETA ) tablet 7.5 mg  7.5 mg Oral Daily Decoste, Gabriella, PA-C       calcium -vitamin D  (OSCAL WITH D) 500-5 MG-MCG per tablet 1 tablet  1 tablet Oral Daily Core, Prentice BROCKS, MD   1 tablet at 10/20/23 0957   Chlorhexidine  Gluconate Cloth 2 % PADS 6 each  6 each Topical Daily Patel, Sona, MD   6 each at 10/19/23 1003   dapagliflozin  propanediol (FARXIGA ) tablet 10 mg  10 mg Oral Daily Decoste, Gabriella, PA-C   10 mg at 10/20/23 0956   fluticasone  (FLONASE ) 50 MCG/ACT nasal spray 2 spray  2 spray Each Nare Daily Core, Prentice BROCKS, MD   2 spray at 10/19/23 1003   furosemide  (LASIX ) tablet 40 mg  40 mg Oral Daily Decoste, Gabriella, PA-C   40 mg at 10/20/23 9043   insulin  aspart  (novoLOG ) injection 0-5 Units  0-5 Units Subcutaneous QHS Patel, Sona, MD       insulin  aspart (novoLOG ) injection 0-9 Units  0-9 Units Subcutaneous TID WC Patel, Sona, MD   1 Units at 10/20/23 0906   lactose free nutrition (BOOST PLUS) liquid 237 mL  237 mL Oral BID BM Patel, Sona, MD   237 mL at 10/20/23 1002   levalbuterol  (XOPENEX ) nebulizer solution 0.63 mg  0.63 mg Nebulization Q6H Patel, Sona, MD   0.63 mg at 10/20/23 0735   losartan  (COZAAR ) tablet 50 mg  50 mg Oral Daily Decoste, Gabriella, PA-C   50 mg at 10/20/23 0956   multivitamin with minerals tablet 1 tablet  1 tablet Oral Daily Core, Prentice BROCKS, MD   1 tablet at 10/20/23 1002   ondansetron  (ZOFRAN ) tablet 4 mg  4 mg Oral Q6H PRN Core, Prentice BROCKS, MD       Or   ondansetron  (ZOFRAN ) injection 4 mg  4 mg Intravenous Q6H PRN Core, Prentice BROCKS, MD       polyethylene glycol (MIRALAX  / GLYCOLAX ) packet 17 g  17 g Oral Daily PRN Core, Prentice BROCKS, MD       spironolactone  (ALDACTONE ) tablet 25 mg  25 mg Oral Daily Decoste, Gabriella, PA-C   25 mg at 10/20/23 9043   umeclidinium-vilanterol (ANORO ELLIPTA ) 62.5-25 MCG/ACT 1 puff  1 puff Inhalation Daily Core, Prentice BROCKS, MD   1 puff at 10/20/23 1007     Discharge Medications: Please see discharge summary for a list of discharge medications.  Relevant Imaging Results:  Relevant Lab Results:   Additional Information SSN# [id number]  Josias Tomerlin C Jadynn Epping, RN

## 2023-10-20 NOTE — TOC Progression Note (Signed)
 Transition of Care [facility]) - Progression Note    Patient Details  Name: Monica Stewart MRN: [id number] Date of Birth: [birth year]  Transition of [facility] CM/SW Contact  Tomasa JAYSON Childes, RN Phone Number: 10/20/2023, 2:27 PM  Clinical Narrative:    Unable to contact patient's daughter, Nat.                      Expected Discharge Plan and Services                                               Social Drivers of Health (SDOH) Interventions SDOH Screenings   Food Insecurity: Food Insecurity Present (06/27/2023)   Received from [facility]  Housing: Low Risk  (06/27/2023)   Received from [facility]  Transportation Needs: No Transportation Needs (06/27/2023)   Received from [facility]  Utilities: Not At Risk (06/27/2023)   Received from [facility]  Financial Resource Strain: Low Risk  (06/27/2023)   Received from [facility]  Tobacco Use: Medium Risk (10/15/2023)    Readmission Risk Interventions     No data to display

## 2023-10-20 NOTE — Plan of Care (Signed)
 PMT shadowing.  Patient's status continues to improve.  TOC is working on discharge to rehab.  Patient has been clear she desires full code and full scope status.  PMT will sign off at this time.  Please reconsult if needs arise.

## 2023-10-20 NOTE — Progress Notes (Signed)
 Heart Failure Stewardship Pharmacy Note  PCP: Rudolpho Norleen BIRCH, MD PCP-Cardiologist: None  HPI: Monica Stewart is a 78 y.o. female with hypertension, hyperlipidemia, chronic respiratory failure (3L), COPD, pulmonary nodule, atrial flutter who presented with several month history of progressive fatigue, LEE, and orthopnea. On admission, BNP was 1150, HS-troponin was 33, AST 132, ALT 145, CRP 1.9, A1c of 6.7, and TSH was 0.627. Chest x-ray noted possible small pleural effusions. TTE this admission noted LVEF reduced to 35-40% with low normal RV function.   Pertinent Lab Values: Creatinine, Ser  Date Value Ref Range Status  10/20/2023 0.62 0.44 - 1.00 mg/dL Final   BUN  Date Value Ref Range Status  10/20/2023 41 (H) 8 - 23 mg/dL Final   Potassium  Date Value Ref Range Status  10/20/2023 5.0 3.5 - 5.1 mmol/L Final  07/18/2013 3.5 3.5 - 5.1 mmol/L Final   Sodium  Date Value Ref Range Status  10/20/2023 142 135 - 145 mmol/L Final   B Natriuretic Peptide  Date Value Ref Range Status  10/15/2023 1,150.0 (H) 0.0 - 100.0 pg/mL Final    Comment:    Performed at [facility], [address], [place], [place] [address]   Magnesium   Date Value Ref Range Status  10/18/2023 2.4 1.7 - 2.4 mg/dL Final    Comment:    Performed at [facility], [address] [place], [place] [address]   Hgb A1c MFr Bld  Date Value Ref Range Status  10/16/2023 6.7 (H) 4.8 - 5.6 % Final    Comment:    (NOTE) Diagnosis of Diabetes The following HbA1c ranges recommended by the American Diabetes Association (ADA) Monica be used as an aid in the diagnosis of diabetes mellitus.  Hemoglobin             Suggested A1C NGSP%              Diagnosis  <5.7                   Non Diabetic  5.7-6.4                Pre-Diabetic  >6.4                   Diabetic  <7.0                   Glycemic control for                       adults with diabetes.     TSH  Date Value Ref Range Status   10/15/2023 0.627 0.350 - 4.500 uIU/mL Final    Comment:    Performed by a 3rd Generation assay with a functional sensitivity of <=0.01 uIU/mL. Performed at [facility], [address] [place], [place] [address]    LDH  Date Value Ref Range Status  02/23/2020 220 (H) 98 - 192 U/L Final    Comment:    Performed at [facility], [address] [place], [place] [address]    Vital Signs: Temp:  [97.2 F (36.2 C)-98.5 F (36.9 C)] 97.7 F (36.5 C) (07/24 0727) Pulse Rate:  [69-114] 92 (07/24 0727) Cardiac Rhythm: Sinus tachycardia (07/23 1922) Resp:  [12-23] 14 (07/24 0727) BP: (106-128)/(71-91) 128/91 (07/24 0727) SpO2:  [93 %-100 %] 96 % (07/24 0727) FiO2 (%):  [30 %] 30 % (07/23 1600) Weight:  [52.9 kg (116 lb 10 oz)] 52.9  kg (116 lb 10 oz) (07/24 0500)  Intake/Output Summary (Last 24 hours) at 10/20/2023 0738 Last data filed at 10/19/2023 1920 Gross per 24 hour  Intake 480 ml  Output 900 ml  Net -420 ml    Current Heart Failure Medications:  Loop diuretic: furosemide  40 mg PO daily Beta-Blocker: metoprolol  tartrate 25 mg BID ACEI/ARB/ARNI: losartan  50 mg daily MRA: spironolactone  25 mg daily SGLT2i: Farxiga  10 mg daily Other: none  Prior to admission Heart Failure Medications:  Loop diuretic: furosemide  20 mg daily Beta-Blocker: none ACEI/ARB/ARNI: losartan  100 mg daily MRA: none SGLT2i: none Other: amlodipine 2.5 mg daily  Assessment: 1. Acute on chronic systolic heart failure (LVEF 35-40%) low normal RV function, due to NICM. NYHA class IV symptoms.  -Symptoms: Patient off BiPAP today with 2L O2. Patient reports continued improvement. LEE is minimally present. Appetite is great. -Volume: Patient appears to be euvolemic. Creatinine and BUN are improving after transition to furosemide  40 mg daily. Contraction alkalosis is improving. Creatinine is stable, but BUN is trending up. Consider transition to oral diuretics. -Hemodynamics: BP and HR  stable this AM. -BB: Doing well on bisoprolol  5 mg daily.  -ACEI/ARB/ARNI: Losartan  reduced to 50 mg daily. Monica benefit from transition to Entresto . Entresto  24-26 mg BID is roughly equivalent in BP reduction to losartan . -MRA: Continue spironolactone  25 mg daily. Potassium is up likely due to oral K supp yesterday. If this does not improve tomorrow, can reduce spiro to 12.5 mg. -SGLT2i: Continue Farxiga  10 mg daily.  Plan: 1) Medication changes recommended at this time: -Consider transition from losartan  to Entresto  24-26 mg BID tomorrow if BP stable on losartan  50 mg daily.  2) Patient assistance: -Copays for Farxiga , Jardiance, Entresto  are $0 -Patient reports she would appreciate mail order prescriptions from [facility]  3) Education: - Patient has been educated on current HF medications and potential additions to HF medication regimen - Patient verbalizes understanding that over the next few months, these medication doses Monica change and more medications Monica be added to optimize HF regimen - Patient has been educated on basic disease state pathophysiology and goals of therapy  Medication Assistance / Insurance Benefits Check: Does the patient have prescription insurance?    Type of insurance plan:  Does the patient qualify for medication assistance through manufacturers or grants? No   Outpatient Pharmacy: Prior to admission outpatient pharmacy: [facility]      Please do not hesitate to reach out with questions or concerns,  Jaun Bash, PharmD, CPP, BCPS, [facility] Heart Failure Pharmacist  Phone - [phone] 10/20/2023 7:38 AM

## 2023-10-20 NOTE — Progress Notes (Signed)
 Occupational Therapy Treatment Patient Details Name: Monica Stewart MRN: [id number] DOB: 11/08/1945 Today's Date: 10/20/2023   History of present illness Monica Stewart is a 78 y.o. female  with a past medical history of hypertension, hyperlipidemia, chronic respiratory failure (3L), COPD, pulmonary nodule and patient reports hx of atrial flutter (years ago) who presented to the ED on 10/15/2023 for worsening fatigue, lower extremity swelling, fatigue for months. Admitted for mgmt of COPD and acute on chronic respiratory failure   OT comments  [name] was seen for OT treatment on this date. Upon arrival to room pt semi-supine in bed, family supports at bedside, agreeable to OT Tx session. OT facilitated ADL management with education and assistance as described below. See ADL section for additional details regarding occupational performance. Pt continues to be functionally limited by generalized weakness, limited cardiopulmonary status, and decreased activity tolerance. Pt return verbalizes understanding of education provided t/o session. Pt is progressing toward OT goals and continues to benefit from skilled OT services to maximize return to PLOF and minimize risk of future falls, injury, caregiver burden, and readmission. Will continue to follow POC as written. Discharge recommendation remains appropriate.        If plan is discharge home, recommend the following:  A lot of help with walking and/or transfers;A lot of help with bathing/dressing/bathroom;Assistance with cooking/housework;Direct supervision/assist for medications management;Direct supervision/assist for financial management;Help with stairs or ramp for entrance;Assist for transportation   Equipment Recommendations  BSC/3in1    Recommendations for Other Services      Precautions / Restrictions Precautions Precautions: Fall Recall of Precautions/Restrictions: Intact Restrictions Weight Bearing Restrictions Per Provider  Order: No       Mobility Bed Mobility Overal bed mobility: Needs Assistance Bed Mobility: Supine to Sit     Supine to sit: HOB elevated, Used rails, Supervision          Transfers Overall transfer level: Needs assistance Equipment used: Rolling walker (2 wheels) Transfers: Sit to/from Stand Sit to Stand: Supervision                 Balance Overall balance assessment: Needs assistance Sitting-balance support: Feet supported, Bilateral upper extremity supported Sitting balance-Leahy Scale: Good     Standing balance support: Bilateral upper extremity supported, During functional activity Standing balance-Leahy Scale: Fair Standing balance comment: standing using RW, fatigues quickly                           ADL either performed or assessed with clinical judgement   ADL Overall ADL's : Needs assistance/impaired Eating/Feeding: Set up;Supervision/ safety;Sitting                       Toilet Transfer: Set up;Supervision/safety;BSC/3in1;Rolling walker (2 wheels) Toilet Transfer Details (indicate cue type and reason): simulated to/from recliner Toileting- Clothing Manipulation and Hygiene: Maximal assistance;Sit to/from stand Toileting - Clothing Manipulation Details (indicate cue type and reason): MAX A for peri-hygiene from STS after pt found to be soiled with BM upon sittig at EOB.     Functional mobility during ADLs: Supervision/safety;Set up;Rolling walker (2 wheels) General ADL Comments: SUPERVISION for bed/functional transfer to recliner with RW. Educated on safety, falls prevention, energy conservation strategies and safe use of AE/DME for ADL management t/o session.    Extremity/Trunk Assessment Upper Extremity Assessment Upper Extremity Assessment: Generalized weakness   Lower Extremity Assessment Lower Extremity Assessment: Generalized weakness   Cervical / Trunk Assessment Cervical /  Trunk Assessment: Kyphotic    Vision  Baseline Vision/History: 1 Wears glasses Ability to See in Adequate Light: 0 Adequate Patient Visual Report: No change from baseline     Perception     Praxis     Communication Communication Communication: No apparent difficulties   Cognition Arousal: Alert Behavior During Therapy: WFL for tasks assessed/performed Cognition: No apparent impairments                               Following commands: Intact        Cueing   Cueing Techniques: Verbal cues  Exercises Other Exercises Other Exercises: OT facilitated ADL management with education and assist as described above.    Shoulder Instructions       General Comments VSS t/o session. Pt on 2L [place] and O2 sats remain >/=88%, HR up to 102 at with mobility.    Pertinent Vitals/ Pain       Pain Assessment Pain Assessment: No/denies pain  Home Living                                          Prior Functioning/Environment              Frequency  Min 2X/week        Progress Toward Goals  OT Goals(current goals can now be found in the care plan section)  Progress towards OT goals: Progressing toward goals  Acute Rehab OT Goals OT Goal Formulation: With patient Time For Goal Achievement: 11/01/23 Potential to Achieve Goals: Fair  Plan      Co-evaluation                 AM-PAC OT 6 Clicks Daily Activity     Outcome Measure   Help from another person eating meals?: A Little Help from another person taking care of personal grooming?: A Little Help from another person toileting, which includes using toliet, bedpan, or urinal?: A Lot Help from another person bathing (including washing, rinsing, drying)?: A Lot Help from another person to put on and taking off regular upper body clothing?: A Little Help from another person to put on and taking off regular lower body clothing?: A Lot 6 Click Score: 15    End of Session Equipment Utilized During Treatment: Rolling walker  (2 wheels);Oxygen   OT Visit Diagnosis: Unsteadiness on feet (R26.81);Other abnormalities of gait and mobility (R26.89);Muscle weakness (generalized) (M62.81)   Activity Tolerance Patient tolerated treatment well   Patient Left in bed;with call bell/phone within reach;with bed alarm set;with family/visitor present   Nurse Communication Mobility status        Time: 9054-8981 OT Time Calculation (min): 33 min  Charges: OT General Charges $OT Visit: 1 Visit OT Treatments $Self Care/Home Management : 23-37 mins  Jhonny Pelton, M.S., OTR/L 10/20/23, 12:42 PM

## 2023-10-20 NOTE — Progress Notes (Signed)
 PULMONOLOGY         Date: 10/20/2023,   MRN# [id number] Monica Stewart [birth year]     AdmissionWeight: 50.9 kg                 CurrentWeight: 52.9 kg  Referring provider: Dr Core   CHIEF COMPLAINT:    Acute on chronic hypoxemic and hypercapnic respiratory failure  HISTORY OF PRESENT ILLNESS   This is a 78 yo F with stage 4 COPD and chronic hypoxemia on 3L/min [place] and recurrent hypercapnic encephalopathy.  Daughter reports courses of confusion at home even at rest. She is weak with deconditioning with baseline mMRC at 4.  She also has advanced Systolic CHF with EF <35% And a list of additional comorbidities. She came in to ER with worsening respiratory distress.  She has been compliant with combination ICS/LABA/LAMA at home with Trelegy once dialy. She also takes chronic prednisone  5mg  and Daliresp . She required BIPAP on arrival and is in respiratory distress. ABG with severe hypercapnic hypoxemic resp failure.  PCCM consultation for further evaluation and management.   10/19/23- patient seen at bedside, grandson is present during interview and examination.  She is sitting up in chair on nasal canula in NAD tripoding on 4L/min .  Was ambulatory in room.  ABG was done yesterday with persistent hypercapnic respiratory failure.  She has PT/OT and is slowly improving towards dc home. She will need NIV upon discharge.  Prednisone  reduced to 35mg  today.   10/20/23- patient in AFrVR during my eval this am. She had cardiology evaluation and plan is to start amiodarone  which is ok for her due to absence of lung fibrosis. She is ambulatory in room on nasal canula in no distress. 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   CARDIAC CATHETERIZATION     CATARACT EXTRACTION W/PHACO Right 01/23/2015   Procedure: CATARACT EXTRACTION PHACO AND INTRAOCULAR LENS PLACEMENT (IOC);  Surgeon: Newell Ovens, MD;  Location: [facility] ORS;  Service: Ophthalmology;  Laterality: Right;  US             1.09 AP             17.7 CDE         12.16 casette lot # [id number]   EYE SURGERY     TUBAL LIGATION       FAMILY HISTORY   Family History  Problem Relation Age of Onset   Breast cancer Sister 55   Breast cancer Paternal Aunt      SOCIAL HISTORY   Social History   Tobacco Use   Smoking status: Former    Current packs/day: 0.00    Types: Cigarettes    Quit date: 01/22/1989    Years since quitting: 34.7   Smokeless tobacco: Never  Substance Use Topics   Alcohol  use: No   Drug use: No     MEDICATIONS    Home Medication:    Current Medication:  Current Facility-Administered Medications:  acetaminophen  (TYLENOL ) tablet 650 mg, 650 mg, Oral, Q6H PRN **OR** acetaminophen  (TYLENOL ) suppository 650 mg, 650 mg, Rectal, Q6H PRN, Core, Prentice BROCKS, MD   albuterol  (PROVENTIL ) (2.5 MG/3ML) 0.083% nebulizer solution 2.5 mg, 2.5 mg, Nebulization, Q2H PRN, Core, Prentice BROCKS, MD, 2.5 mg at 10/16/23 1106   ALPRAZolam  (XANAX ) tablet 0.25 mg, 0.25 mg, Oral, TID PRN, Patel, Sona, MD, 0.25 mg at 10/19/23 1405   apixaban  (ELIQUIS ) tablet 5 mg, 5 mg, Oral, BID, Decoste, Gabriella, PA-C, 5 mg at 10/20/23 0957   bisoprolol  (ZEBETA ) tablet 5 mg, 5 mg, Oral, Daily, Decoste, Gabriella, PA-C, 5 mg at 10/20/23 9043   calcium -vitamin D  (OSCAL WITH D) 500-5 MG-MCG per tablet 1 tablet, 1 tablet, Oral, Daily, Core, Prentice BROCKS, MD, 1 tablet at 10/20/23 0957   Chlorhexidine  Gluconate Cloth 2 % PADS 6 each, 6 each, Topical, Daily, Tobie Calix, MD, 6 each at 10/19/23 1003    dapagliflozin  propanediol (FARXIGA ) tablet 10 mg, 10 mg, Oral, Daily, Decoste, Gabriella, PA-C, 10 mg at 10/20/23 0956   fluticasone  (FLONASE ) 50 MCG/ACT nasal spray 2 spray, 2 spray, Each Nare, Daily, Core, Prentice BROCKS, MD, 2 spray at 10/19/23 1003   furosemide  (LASIX ) tablet 40 mg, 40 mg, Oral, Daily, Decoste, Gabriella, PA-C, 40 mg at 10/20/23 9043   insulin  aspart (novoLOG ) injection 0-5 Units, 0-5 Units, Subcutaneous, QHS, Patel, Sona, MD   insulin  aspart (novoLOG ) injection 0-9 Units, 0-9 Units, Subcutaneous, TID WC, Patel, Sona, MD, 1 Units at 10/20/23 0906   lactose free nutrition (BOOST PLUS) liquid 237 mL, 237 mL, Oral, BID BM, Patel, Sona, MD, 237 mL at 10/20/23 1002   levalbuterol  (XOPENEX ) nebulizer solution 0.63 mg, 0.63 mg, Nebulization, Q6H, Patel, Sona, MD, 0.63 mg at 10/20/23 9264   losartan  (COZAAR ) tablet 50 mg, 50 mg, Oral, Daily, Decoste, Gabriella, PA-C, 50 mg at 10/20/23 9043   multivitamin with minerals tablet 1 tablet, 1 tablet, Oral, Daily, Core, Prentice BROCKS, MD, 1 tablet at 10/20/23 1002   ondansetron  (ZOFRAN ) tablet 4 mg, 4 mg, Oral, Q6H PRN **OR** ondansetron  (ZOFRAN ) injection 4 mg, 4 mg, Intravenous, Q6H PRN, Core, Prentice BROCKS, MD   polyethylene glycol (MIRALAX  / GLYCOLAX ) packet 17 g, 17 g, Oral, Daily PRN, Core, Prentice BROCKS, MD   spironolactone  (ALDACTONE ) tablet 25 mg, 25 mg, Oral, Daily, Decoste, Gabriella, PA-C, 25 mg at 10/20/23 9043   umeclidinium-vilanterol (ANORO ELLIPTA ) 62.5-25 MCG/ACT 1 puff, 1 puff, Inhalation, Daily, Core, Prentice BROCKS, MD, 1 puff at 10/20/23 1007    ALLERGIES   Lisinopril-hydrochlorothiazide      REVIEW OF SYSTEMS    Review of Systems:  Gen:  Denies  fever, sweats, chills weigh loss  HEENT: Denies blurred vision, double vision, ear pain, eye pain, hearing loss, nose bleeds, sore throat Cardiac:  No dizziness, chest pain or heaviness, chest tightness,edema Resp:   reports dyspnea chronically  Gi: Denies swallowing difficulty, stomach pain,  nausea or vomiting, diarrhea, constipation, bowel incontinence Gu:  Denies bladder incontinence, burning urine Ext:   Denies Joint pain, stiffness or swelling Skin: Denies  skin rash, easy bruising or bleeding or hives Endoc:  Denies polyuria, polydipsia , polyphagia or weight change Psych:   Denies depression, insomnia or hallucinations   Other:  All other systems negative   VS: BP (!) 128/91 (BP Location: Left Arm)   Pulse 92   Temp 97.7 F (36.5 C)   Resp 14   Ht 5' 4 (1.626 m)   Wt 52.9 kg   SpO2 96%  BMI 20.02 kg/m      PHYSICAL EXAM    GENERAL:NAD, no fevers, chills, no weakness no fatigue HEAD: Normocephalic, atraumatic.  EYES: Pupils equal, round, reactive to light. Extraocular muscles intact. No scleral icterus.  MOUTH: Moist mucosal membrane. Dentition intact. No abscess noted.  EAR, NOSE, THROAT: Clear without exudates. No external lesions.  NECK: Supple. No thyromegaly. No nodules. No JVD.  PULMONARY: decreased breath sounds with mild rhonchi worse at bases bilaterally.  CARDIOVASCULAR: S1 and S2. Regular rate and rhythm. No murmurs, rubs, or gallops. No edema. Pedal pulses 2+ bilaterally.  GASTROINTESTINAL: Soft, nontender, nondistended. No masses. Positive bowel sounds. No hepatosplenomegaly.  MUSCULOSKELETAL: No swelling, clubbing, or edema. Range of motion full in all extremities.  NEUROLOGIC: Cranial nerves II through XII are intact. No gross focal neurological deficits. Sensation intact. Reflexes intact.  SKIN: No ulceration, lesions, rashes, or cyanosis. Skin warm and dry. Turgor intact.  PSYCHIATRIC: Mood, affect within normal limits. The patient is awake, alert and oriented x 3. Insight, judgment intact.       IMAGING   CLINICAL DATA: COPD exacerbation  EXAM: PORTABLE CHEST - 1 VIEW  COMPARISON: 02/23/2020  FINDINGS: Pulmonary hyperinflation with attenuated bronchovascular markings in both upper lobes. No focal airspace disease. Mild  interstitial prominence in the lung bases, increased from previous.  Heart size and mediastinal contours are within normal limits. Aortic Atherosclerosis (ICD10-170.0).  Mild blunting of the lateral costophrenic angles.  Visualized bones unremarkable.  IMPRESSION: 1. Pulmonary hyperinflation with mild bibasilar interstitial prominence. 2. Possible small pleural effusions.   Electronically Signed By: JONETTA Faes M.D. On: 10/15/2023 21:15   ASSESSMENT/PLAN   Acute on chronic hypoxemic hypercapnic respiratory failure    -due to Severe Acute COPD exacerbation    -possible culprit is viral vs bacterial infection     - CRP trend    - procalcitonin trend    - respiratory culture    - resp viral panel    - COVID/FLU/RSV negative    - continue COPD care path with steroids, nebs, abx    - IS as able , flutter as able     - repeat CXR in 48hr    -legionella and strep pneumoniae testing    -BODE score >8 with <20% long term survival    - palliative care consultation for possible comfort care / hospice evaluation    -pred reduced to 35mg    Hypercapnic respiratory failure with Encephalopathy    - continue BIPAP     - repeat VBG in 6 hr   Bibasilar atelelctasis      Chest physiotherapy      - continue BIPAP      - PT/OT     Advanced Systolic CHF      - continue diuresis prn      - trans thoracic echo with both left and right heart dysfunction - ddimer to rule out PE      - LVEF <35%      Thank you for allowing me to participate in the care of this patient.   Patient/Family are satisfied with care plan and all questions have been answered.    Provider disclosure: Patient with at least one acute or chronic illness or injury that poses a threat to life or bodily function and is being managed actively during this encounter.  All of the below services have been performed independently by signing provider:  review of prior documentation from internal and or external health  records.  Review  of previous and current lab results.  Interview and comprehensive assessment during patient visit today. Review of current and previous chest radiographs/CT scans. Discussion of management and test interpretation with health care team and patient/family.   This document was prepared using Dragon voice recognition software and may include unintentional dictation errors.     Monica Stewart, M.D.  Division of Pulmonary & Critical Care Medicine

## 2023-10-21 DIAGNOSIS — J9601 Acute respiratory failure with hypoxia: Secondary | ICD-10-CM | POA: Diagnosis not present

## 2023-10-21 DIAGNOSIS — J441 Chronic obstructive pulmonary disease with (acute) exacerbation: Secondary | ICD-10-CM | POA: Diagnosis not present

## 2023-10-21 DIAGNOSIS — I5021 Acute systolic (congestive) heart failure: Secondary | ICD-10-CM | POA: Diagnosis not present

## 2023-10-21 DIAGNOSIS — I428 Other cardiomyopathies: Secondary | ICD-10-CM | POA: Diagnosis not present

## 2023-10-21 DIAGNOSIS — I4891 Unspecified atrial fibrillation: Secondary | ICD-10-CM | POA: Diagnosis not present

## 2023-10-21 LAB — BASIC METABOLIC PANEL WITH GFR
Anion gap: 11 (ref 5–15)
BUN: 43 mg/dL — ABNORMAL HIGH (ref 8–23)
CO2: 44 mmol/L — ABNORMAL HIGH (ref 22–32)
Calcium: 10.5 mg/dL — ABNORMAL HIGH (ref 8.9–10.3)
Chloride: 87 mmol/L — ABNORMAL LOW (ref 98–111)
Creatinine, Ser: 0.75 mg/dL (ref 0.44–1.00)
GFR, Estimated: >60 mL/min (ref >60)
Glucose, Bld: 155 mg/dL — ABNORMAL HIGH (ref 70–99)
Potassium: 5 mmol/L (ref 3.5–5.1)
Sodium: 142 mmol/L (ref 135–145)

## 2023-10-21 LAB — GLUCOSE, CAPILLARY
Glucose-Capillary: 204 mg/dL — ABNORMAL HIGH (ref 70–99)
Glucose-Capillary: 132 mg/dL — ABNORMAL HIGH (ref 70–99)
Glucose-Capillary: 98 mg/dL (ref 70–99)
Glucose-Capillary: 296 mg/dL — ABNORMAL HIGH (ref 70–99)
Glucose-Capillary: 136 mg/dL — ABNORMAL HIGH (ref 70–99)

## 2023-10-21 MED ORDER — FUROSEMIDE 20 MG PO TABS
20.0000 mg | ORAL_TABLET | Freq: Every day | ORAL | Status: DC
  Administered 2023-10-23: 20 mg via ORAL
  Filled 2023-10-21: qty 1

## 2023-10-21 MED ORDER — SPIRONOLACTONE 12.5 MG HALF TABLET
12.5000 mg | ORAL_TABLET | Freq: Every day | ORAL | Status: DC
  Administered 2023-10-21 – 2023-10-25 (×5): 12.5 mg via ORAL
  Filled 2023-10-21 (×5): qty 1

## 2023-10-21 MED ORDER — PREDNISONE 20 MG PO TABS
30.0000 mg | ORAL_TABLET | Freq: Every day | ORAL | Status: DC
  Administered 2023-10-22 – 2023-10-24 (×3): 30 mg via ORAL
  Filled 2023-10-21 (×3): qty 1

## 2023-10-21 MED ORDER — FUROSEMIDE 20 MG PO TABS: 20.0000 mg | ORAL_TABLET | Freq: Every day | ORAL | Status: DC

## 2023-10-21 NOTE — Progress Notes (Signed)
 Physical Therapy Treatment Patient Details Name: Monica Stewart MRN: [id number] DOB: 10/06/1945 Today's Date: 10/21/2023   History of Present Illness Monica Stewart is a 78 y.o. female  with a past medical history of hypertension, hyperlipidemia, chronic respiratory failure (3L), COPD, pulmonary nodule and patient reports hx of atrial flutter (years ago) who presented to the ED on 10/15/2023 for worsening fatigue, lower extremity swelling, fatigue for months. Admitted for mgmt of COPD and acute on chronic respiratory failure    PT Comments  Patient alert, agreeable to PT, denied pain. Oriented, with family in room complained of being wet. Noted for wet linens and gown, minA to change gown and out of bed to recliner, tech in room to change bed. She was able to stand twice, CGA-supervision with RW, and ambulated in room ~41ft. No LOB, decreased gait velocity and mildly SOB with activity, spO2 >90%. Pt up in recliner with needs in reach. The patient would benefit from further skilled PT intervention to continue to progress towards goals.      If plan is discharge home, recommend the following: A lot of help with walking and/or transfers;A lot of help with bathing/dressing/bathroom;Assist for transportation;Help with stairs or ramp for entrance   Can travel by private vehicle     Yes  Equipment Recommendations  None recommended by PT    Recommendations for Other Services       Precautions / Restrictions Precautions Precautions: Fall Recall of Precautions/Restrictions: Intact Restrictions Weight Bearing Restrictions Per Provider Order: No     Mobility  Bed Mobility Overal bed mobility: Needs Assistance Bed Mobility: Supine to Sit     Supine to sit: HOB elevated, Used rails, Supervision          Transfers Overall transfer level: Needs assistance Equipment used: Rolling walker (2 wheels), 1 person hand held assist Transfers: Sit to/from Stand Sit to Stand: Supervision, Contact  guard assist           General transfer comment: CGA with handheld assist to stand to recliner in room    Ambulation/Gait Ambulation/Gait assistance: Contact guard assist Gait Distance (Feet): 20 Feet Assistive device: Rolling walker (2 wheels)         General Gait Details: slow step through pattern, fatigued but able to complete without physical assistance   Stairs             Wheelchair Mobility     Tilt Bed    Modified Rankin (Stroke Patients Only)       Balance Overall balance assessment: Needs assistance Sitting-balance support: Feet supported, Bilateral upper extremity supported Sitting balance-Leahy Scale: Good     Standing balance support: Bilateral upper extremity supported, During functional activity Standing balance-Leahy Scale: Fair                              Hotel manager: No apparent difficulties  Cognition Arousal: Alert Behavior During Therapy: WFL for tasks assessed/performed   PT - Cognitive impairments: No apparent impairments                         Following commands: Intact      Cueing Cueing Techniques: Verbal cues  Exercises      General Comments        Pertinent Vitals/Pain Pain Assessment Pain Assessment: No/denies pain    Home Living  Prior Function            PT Goals (current goals can now be found in the care plan section) Progress towards PT goals: Progressing toward goals    Frequency    Min 2X/week      PT Plan      Co-evaluation              AM-PAC PT 6 Clicks Mobility   Outcome Measure  Help needed turning from your back to your side while in a flat bed without using bedrails?: None Help needed moving from lying on your back to sitting on the side of a flat bed without using bedrails?: None Help needed moving to and from a bed to a chair (including a wheelchair)?: A Little Help needed  standing up from a chair using your arms (e.g., wheelchair or bedside chair)?: A Little Help needed to walk in hospital room?: A Little Help needed climbing 3-5 steps with a railing? : A Lot 6 Click Score: 19    End of Session Equipment Utilized During Treatment: Oxygen  Activity Tolerance: Patient tolerated treatment well Patient left: in chair;with call bell/phone within reach;with chair alarm set;with family/visitor present Nurse Communication: Mobility status PT Visit Diagnosis: Unsteadiness on feet (R26.81);Other abnormalities of gait and mobility (R26.89);Muscle weakness (generalized) (M62.81)     Time: 8894-8871 PT Time Calculation (min) (ACUTE ONLY): 23 min  Charges:    $Therapeutic Activity: 23-37 mins PT General Charges $$ ACUTE PT VISIT: 1 Visit                     Doyal Shams PT, DPT 12:16 PM,10/21/23

## 2023-10-21 NOTE — Progress Notes (Signed)
 [facility]  - [facility] [place] [facility]   PATIENT NAME: Monica Stewart    MR#:  [id number]  DATE OF BIRTH:  May 08, 1945  SUBJECTIVE:  [place]/ at bedside. Spoke with dter Nat on the phone Patient improving. Weaned off BiPAP will use it as needed. Currently sats stable on 4 L nasal cannula oxygen . cardiac monitor  VITALS:  Blood pressure 97/83, pulse 78, temperature 98.3 F (36.8 C), resp. rate 14, height 5' 4 (1.626 m), weight 52.9 kg, SpO2 99%.  PHYSICAL EXAMINATION:   GENERAL:  78 y.o.-year-old patient with moderate acute distress. Frail, malnourished LUNGS: distant breath sounds bilaterally, no wheezing on [place] oxygen  CARDIOVASCULAR: S1, S2 normal. No murmur tachycardia ABDOMEN: Soft, nontender, nondistended.  EXTREMITIES: no edema b/l.    NEUROLOGIC: nonfocal  patient is alert and awake, anxious   LABORATORY PANEL:  CBC Recent Labs  Lab 10/19/23 0408  WBC 12.6*  HGB 11.9*  HCT 38.1  PLT 208    Chemistries  Recent Labs  Lab 10/18/23 0548 10/19/23 0408 10/20/23 0431 10/21/23 0607  NA 141   < > 142 142  K 4.2   < > 5.0 5.0  CL 91*   < > 89* 87*  CO2 37*   < > 41* 44*  GLUCOSE 103*   < > 126* 155*  BUN 43*   < > 41* 43*  CREATININE 0.91   < > 0.62 0.75  CALCIUM  9.5   < > 10.6* 10.5*  MG 2.4  --   --   --   AST  --   --  50*  --   ALT  --   --  113*  --   ALKPHOS  --   --  66  --   BILITOT  --   --  0.4  --    < > = values in this interval not displayed.    Assessment and Plan  78 year old female with a past medical history of chronic respiratory failure on 3 L home oxygen , chronic obstructive pulmonary disorder FEV1 of 0.6 L, pulmonary nodule, gastroesophageal reflux disease who presents to the emergency department with increasing shortness of breath and dependent edema the patient is talking fluently on BiPAP reporting shortness of breath worsening since yesterday she reports increased sputum production and slightly increased cough denies  any fever or chills she also reports approximately 2 months of increasing dependent edema she does endorse orthopnea. Patient does not smoke currently however was a heavy smoker many years ago. She follows with Dr. Theotis of pulmonary as outpatient  Acute hypoxic Hypercapneic respiratory failure severe end-stage COPD/emphysema chronic respiratory failure oxygen  dependent 3 L at home/steroid dependent COPD elevated D dimer ?PE -- patient currently on BiPAP. She is using accessory muscles for breathing. -- Pulmonary  consultation with Dr. DELENA. -- Palliative care consultation noted -- was on IV Solu-Medrol , IV Lasix , nebulizer, BiPAP--changed to po lasix  and po steroid -- Xanax  PRN now able to take orally --pt will need NIV at discharge--TOC aware  Acute systolic congestive heart failure-- new atrial fibrillation new onset -- elevated BNP, orthopnea, pulmonary vascular congestion noted on chest x-ray and EF of 35 to 40% with severe global hypokinesis -- IV Lasix  40 mg BID -- currently on losartan  and metoprolol  if patient can tolerate PO --  cardiology consultation with [facility]. -- Started on Spironolactone ,farxiga , losartan ,bisoprolol  -- per cardiology now on eliquis  --po lasix  --cardiac monitor placed per cardiology PA --now on Amiodarone  400 mg bid x7  days -->then 200 mg po qd  Failure to thrive/malnourished in the setting of severe COPD Difficulty swallowing --  ST input noted  Elevated D dimer Atria fibrillation--new -- now on eliquis   Hyperlipidemia -- statins   DM (diabetes mellitus), type 2 (HCC) Type 2 diabetes mellitus with anticipated steroid-induced hyperglycemia --Have placed on sliding scale insulin   FTT/moderate to severe malnutrition --RD to see    Palliative care consultation appreciated. PT/OT recommends rehab TOC for d/c planning to rehab with NIV--pt is medically best at baseline for d/c.      Family communication :Nat on the  phone Consults : pulmonary, cardiology CODE STATUS: FULL DVT Prophylaxis : eliquis  Level of care: Progressive Status is: Inpatient Remains inpatient appropriate because: awaitng rehab bed and NIV    TOTAL critical TIME TAKING CARE OF THIS PATIENT: 35 minutes.  >50% time spent on counselling and coordination of care  Note: This dictation was prepared with Dragon dictation along with smaller phrase technology. Any transcriptional errors that result from this process are unintentional.  Leita Blanch M.D    [facility]   CC: Primary care physician; Rudolpho Norleen BIRCH, MD

## 2023-10-21 NOTE — Progress Notes (Signed)
 Speech Language Pathology Treatment: Dysphagia  Patient Details Name: Monica Stewart MRN: [id number] DOB: [birth year] Today's Date: 10/21/2023 Time: 9144-9079 SLP Time Calculation (min) (ACUTE ONLY): 25 min  Assessment / Plan / Recommendation Clinical Impression  Pt seen for ongoing assessment of swallowing; education on general aspiration precautions and strategies for conservation of energy during oral intake and safer swallowing. Pt has been off BiPAP for ~2 days w/ less anxiety about needing to use per her report. She was alert, verbally responsive and engaged in this tx session w/ this SLP; Family present. Min; increased WOB/effort on O2 support- Baseline, Chronic Pulmonary decline.  Pt had recently finished breakfast meal w/ No swallowing issues reported; Educaion on precautions and strategies completed w/ pt/Family. On 2-3L [place] O2 support, afebrile.     Pt/Family given explanation and handouts on general aspiration precautions and strategies for conservation of energy; also food consistencies for ease oral intake and discussion on the Dysphagia level 2 (minced) foods diet. Pt and Family agreed verbally to the need for following aspiration precautions to lessen risk for aspiration/aspiration pneumonia including sitting upright for all oral intake and taking Rest Breaks during oral intake w/ careful monitoring of her breathing during the exertion of eating/drinking. Rest Breaks and strategies for conservation of energy were discussed; education and examples given/modeled. Pt agreed that Medications CRUSHED in Puree were easiest right now -- the Puree providing cohesion for swallowing. Discussed behavioral choices to lessen risk for aspiration including NO TALKING when eating/drinking- focus on clearing mouth fully/stop eating and drinking b/f deciding to talk.     Pt appears at reduced risk for aspiration when following general aspiration precautions and using strategies to promote safer  swallowing during eating/drinking, especially strategies focusing on conservation of energy in setting of her Chronic, declined Pulmonary status and requiring O2 support at Baseline. Recommend continue a Dysphagia level 2 (MINCED foods) diet at this time for ease and less exertion of mastication, w/ gravies added to moisten foods; Thin liquids via Cup. Less Straw use d/t Pulmonary status. Recommend general aspiration precautions- NO TALKING during eating/drinking; Pills CRUSHED in Puree; tray setup and positioning assistance for meals. REFLUX precautions.  No further Acute ST services indicated at this time; can be had for further education if indicated/desired at her next venue of care. Precautions posted in room; Family/pt given Handouts on Dysphagia level 2 diet, precautions, and suggestions re: oral intake w/ COPD. MD/NSG updated.      HPI HPI: Per H&P: 78 year old female with a past medical history of Chronic respiratory failure on 3 L home oxygen , chronic obstructive pulmonary disorder FEV1 of 0.6 L, pulmonary nodule, gastroesophageal reflux disease who presents to the emergency department with increasing shortness of breath and dependent edema the patient is talking fluently on BiPAP reporting shortness of breath worsening since yesterday she reports increased sputum production and slightly increased cough denies any fever or chills she also reports approximately 2 months of increasing dependent Edema she does endorse orthopnea but no paroxysmal nocturnal dyspnea.  She denies any recent medication changes reports compliance to her medications including her chronic prednisone  5 mg daily.  The patient denies any chest pain or any palpitations.  DG Chest 7/19:  Pulmonary hyperinflation with mild bibasilar interstitial  prominence.  2. Possible small pleural effusions.      SLP Plan  All goals met          Recommendations  Diet recommendations: Dysphagia 2 (fine chop);Thin liquid  (gravies) Liquids provided via: Cup;No straw  Medication Administration: Crushed with puree (vs whole in puree if able) Supervision: Patient able to self feed;Staff to assist with self feeding;Intermittent supervision to cue for compensatory strategies Compensations: Minimize environmental distractions;Slow rate;Small sips/bites;Lingual sweep for clearance of pocketing;Multiple dry swallows after each bite/sip;Follow solids with liquid (Rest Breaks during to calm breathing) Postural Changes and/or Swallow Maneuvers: Out of bed for meals;Seated upright 90 degrees;Upright 30-60 min after meal                Rehab consult (PT/OT) Oral care BID;Oral care before and after PO;Staff/trained caregiver to provide oral care (support pt)   Intermittent Supervision/Assistance Dysphagia, unspecified (R13.10) (impacted by Baseline Pulmonary Decline/functioning)     All goals met      Comer Portugal, MS, CCC-SLP Speech Language Pathologist Rehab Services; [facility] [phone] (ascom) Tauni Sanks  10/21/2023, 2:54 PM

## 2023-10-21 NOTE — Plan of Care (Signed)
  Problem: Metabolic: Goal: Ability to maintain appropriate glucose levels will improve Outcome: Progressing   Problem: Nutritional: Goal: Maintenance of adequate nutrition will improve Outcome: Progressing   Problem: Skin Integrity: Goal: Risk for impaired skin integrity will decrease Outcome: Progressing   Problem: Clinical Measurements: Goal: Respiratory complications will improve Outcome: Progressing Goal: Cardiovascular complication will be avoided Outcome: Progressing   Problem: Activity: Goal: Risk for activity intolerance will decrease Outcome: Progressing   Problem: Nutrition: Goal: Adequate nutrition will be maintained Outcome: Progressing   Problem: Safety: Goal: Ability to remain free from injury will improve Outcome: Progressing

## 2023-10-21 NOTE — Plan of Care (Signed)

## 2023-10-21 NOTE — Progress Notes (Signed)
 [facility] CARDIOLOGY PROGRESS NOTE       Patient ID: Monica Stewart MRN: [id number] DOB/AGE: Jun 01, 1945 78 y.o.  Admit date: 10/15/2023 Referring Physician Dr. Tobie Primary Physician Rudolpho Norleen BIRCH, MD Primary Cardiologist Dr. Bosie (2015) Reason for Consultation Newly reduced EF  HPI: Monica Stewart is a 78 y.o. female  with a past medical history of hypertension, hyperlipidemia, chronic respiratory failure (3L), COPD, pulmonary nodule and patient reports hx of atrial flutter (years ago) who presented to the ED on 10/15/2023 for worsening fatigue, lower extremity swelling, fatigue for months.  Patient endorses orthopnea.  EKG on 07/21 and telemetry new onset atrial flutter relation.  Echo during this admission revealed a reduced EF 35-45%. Cardiology was consulted for further evaluation.   Interval History: -Patient seen and examined this AM and laying in hospital bed nearly flat with family at bedside. Patient states she feels good today.  States SOB is improving.  Continues to deny chest pain, palpitations or lightheadedness.  -LEE much improved. -Patients BP and HR stable this AM.  -Patient remains on 2L with stable SpO2. -Recommend patient continue to work with PT.    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   CARDIAC CATHETERIZATION     CATARACT EXTRACTION W/PHACO Right 01/23/2015   Procedure: CATARACT EXTRACTION PHACO AND INTRAOCULAR LENS PLACEMENT (IOC);  Surgeon: Newell Ovens, MD;  Location: [facility] ORS;  Service: Ophthalmology;  Laterality: Right;  US             1.09 AP              17.7 CDE         12.16 casette lot # [id number]   EYE SURGERY     TUBAL LIGATION      Medications Prior to Admission  Medication Sig Dispense Refill Last Dose/Taking   albuterol  (VENTOLIN  HFA) 108 (90 Base) MCG/ACT inhaler Inhale 1-2 puffs into the lungs every 6 (six) hours as needed for wheezing or shortness of breath.   10/16/2023   alendronate (FOSAMAX) 70 MG tablet Take 70 mg by mouth once a week.   10/15/2023 Evening   amLODipine (NORVASC) 2.5 MG tablet Take 2.5 mg by mouth daily.   10/15/2023 Evening   Azelastine HCl 137 MCG/SPRAY SOLN Place 1 spray into both nostrils 2 (two) times daily.   10/15/2023 Evening   calcium  citrate-vitamin D  500-400 MG-UNIT chewable tablet Chew 1 tablet by mouth daily.    10/15/2023 Evening   Ferrous Fumarate (HEMOCYTE - 106 MG FE) 324 (106 Fe) MG TABS tablet Take 1 tablet by mouth every other day.   Unknown   fluticasone  (FLONASE ) 50 MCG/ACT nasal spray Place 2 sprays into both nostrils daily.   10/15/2023 Evening   furosemide  (LASIX ) 20 MG tablet Take 20 mg by mouth daily.   10/16/2023 Morning   Iron-Vitamins (GERITOL PO) Take 1 tablet by mouth daily.   Unknown   loratadine  (CLARITIN ) 10 MG tablet Take 10 mg by mouth daily.   10/15/2023 Evening   losartan  (COZAAR ) 100 MG  tablet Take 100 mg by mouth daily.   10/15/2023 Evening   lovastatin (MEVACOR) 40 MG tablet Take 40 mg by mouth at bedtime.   10/15/2023 Evening   meloxicam (MOBIC) 7.5 MG tablet Take 7.5 mg by mouth daily.   10/16/2023 Morning   Multiple Vitamins-Minerals (MULTIVITAMIN WITH MINERALS) tablet Take 1 tablet by mouth daily.   10/16/2023 Morning   NON FORMULARY Take 1 capsule by mouth daily.   Unknown   predniSONE  (DELTASONE ) 5 MG tablet Take 5 mg by mouth daily.   10/16/2023 Morning   Roflumilast 250 MCG TABS Take 250 mcg by mouth daily.   10/15/2023 Evening   TRELEGY ELLIPTA 100-62.5-25 MCG/INH AEPB Inhale 1 puff into the lungs daily.   10/16/2023 Morning   hydrochlorothiazide  (HYDRODIURIL ) 25 MG tablet  Take 25 mg by mouth daily. (Patient not taking: Reported on 10/16/2023)   Not Taking   Ipratropium-Albuterol  (COMBIVENT ) 20-100 MCG/ACT AERS respimat Inhale 1 puff into the lungs every 6 (six) hours for 7 days.  0    Social History   Socioeconomic History   Marital status: Divorced    Spouse name: Not on file   Number of children: Not on file   Years of education: Not on file   Highest education level: Not on file  Occupational History   Not on file  Tobacco Use   Smoking status: Former    Current packs/day: 0.00    Types: Cigarettes    Quit date: 01/22/1989    Years since quitting: 34.7   Smokeless tobacco: Never  Substance and Sexual Activity   Alcohol  use: No   Drug use: No   Sexual activity: Not on file  Other Topics Concern   Not on file  Social History Narrative   Not on file   Social Drivers of Health   Financial Resource Strain: Low Risk  (06/27/2023)   Received from [facility]   Overall Financial Resource Strain (CARDIA)    Difficulty of Paying Living Expenses: Not very hard  Food Insecurity: Food Insecurity Present (06/27/2023)   Received from [facility]   Hunger Vital Sign    Within the past 12 months, you worried that your food would run out before you got the money to buy more.: Never true    Within the past 12 months, the food you bought just didn't last and you didn't have money to get more.: Sometimes true  Transportation Needs: No Transportation Needs (06/27/2023)   Received from [facility] - Transportation    In the past 12 months, has lack of transportation kept you from medical appointments or from getting medications?: No    Lack of Transportation (Non-Medical): No  Physical Activity: Not on file  Stress: Not on file  Social Connections: Not on file  Intimate Partner Violence: Not on file    Family History  Problem Relation Age of Onset   Breast cancer Sister 61   Breast cancer  Paternal Aunt      Vitals:   10/21/23 0500 10/21/23 0725 10/21/23 0800 10/21/23 1118  BP: 109/70 106/70 108/67 97/83  Pulse: 79  82 78  Resp: 20 12 (!) 21 14  Temp: 97.6 F (36.4 C) 97.9 F (36.6 C)  98.3 F (36.8 C)  TempSrc: Oral     SpO2: 100%  100% 99%  Weight:      Height:        PHYSICAL EXAM General: Chronically ill appearing female,  well nourished, in no acute distress. HEENT: Normocephalic and atraumatic. Neck: No JVD.   Lungs: Normal respiratory effort on 2L. Diminished breath sounds bilaterally Heart: HRR, elevated HR. Normal S1 and S2 without gallops or murmurs.  Abdomen: Non-distended appearing.  Msk: Normal strength and tone for age. Extremities: Warm and well perfused. No clubbing, cyanosis, edema.  Neuro: Alert and oriented X 3. Psych: Answers questions appropriately.   Labs: Basic Metabolic Panel: Recent Labs    10/20/23 0431 10/21/23 0607  NA 142 142  K 5.0 5.0  CL 89* 87*  CO2 41* 44*  GLUCOSE 126* 155*  BUN 41* 43*  CREATININE 0.62 0.75  CALCIUM  10.6* 10.5*   Liver Function Tests: Recent Labs    10/20/23 0431  AST 50*  ALT 113*  ALKPHOS 66  BILITOT 0.4  PROT 6.2*  ALBUMIN 3.2*    No results for input(s): LIPASE, AMYLASE in the last 72 hours. CBC: Recent Labs    10/19/23 0408  WBC 12.6*  HGB 11.9*  HCT 38.1  MCV 93.6  PLT 208   Cardiac Enzymes: No results for input(s): CKTOTAL, CKMB, CKMBINDEX, TROPONINIHS in the last 72 hours.  BNP: No results for input(s): BNP in the last 72 hours.  D-Dimer: No results for input(s): DDIMER in the last 72 hours.  Hemoglobin A1C: No results for input(s): HGBA1C in the last 72 hours.  Fasting Lipid Panel: No results for input(s): CHOL, HDL, LDLCALC, TRIG, CHOLHDL, LDLDIRECT in the last 72 hours. Thyroid Function Tests: No results for input(s): TSH, T4TOTAL, T3FREE, THYROIDAB in the last 72 hours.  Invalid input(s): FREET3  Anemia Panel: No  results for input(s): VITAMINB12, FOLATE, FERRITIN, TIBC, IRON, RETICCTPCT in the last 72 hours.   Radiology: ECHOCARDIOGRAM COMPLETE Result Date: 10/16/2023    ECHOCARDIOGRAM REPORT   Patient Name:   Monica Stewart Date of Exam: 10/16/2023 Medical Rec #:  [id number]       Height:       64.0 in Accession #:    [id number]      Weight:       112.2 lb Date of Birth:  [birth year]       BSA:          1.530 m Patient Age:    77 years        BP:           137/88 mmHg Patient Gender: F               HR:           115 bpm. Exam Location:  [facility] Procedure: 2D Echo, 3D Echo, Cardiac Doppler, Color Doppler and Strain Analysis            (Both Spectral and Color Flow Doppler were utilized during            procedure). Indications:     Elevated Troponin  History:         Patient has prior history of Echocardiogram examinations, most                  recent 05/03/2023.  Sonographer:     Thedora Louder RDCS, FASE Referring Phys:  [id number] PRENTICE BROCKS CORE Diagnosing Phys: Cara JONETTA Lovelace MD  Sonographer Comments: Global longitudinal strain was attempted. IMPRESSIONS  1. Left ventricular ejection fraction, by estimation, is 35 to 40%. The left ventricle has moderately decreased function. The left ventricle demonstrates global hypokinesis. The left ventricular internal cavity size was mildly dilated. Left  ventricular diastolic function could not be evaluated. The average left ventricular global longitudinal strain is 11.1 %. The global longitudinal strain is abnormal.  2. Right ventricular systolic function is low normal. The right ventricular size is mildly enlarged.  3. The mitral valve is normal in structure. Trivial mitral valve regurgitation.  4. The aortic valve is normal in structure. Aortic valve regurgitation is not visualized. Aortic valve sclerosis is present, with no evidence of aortic valve stenosis. FINDINGS  Left Ventricle: Left ventricular ejection fraction, by estimation, is 35 to 40%. The left ventricle  has moderately decreased function. The left ventricle demonstrates global hypokinesis. The average left ventricular global longitudinal strain is 11.1 %.  Strain was performed and the global longitudinal strain is abnormal. The left ventricular internal cavity size was mildly dilated. There is no left ventricular hypertrophy. Left ventricular diastolic function could not be evaluated. Right Ventricle: The right ventricular size is mildly enlarged. No increase in right ventricular wall thickness. Right ventricular systolic function is low normal. Left Atrium: Left atrial size was normal in size. Right Atrium: Right atrial size was normal in size. Pericardium: There is no evidence of pericardial effusion. Mitral Valve: The mitral valve is normal in structure. Trivial mitral valve regurgitation. Tricuspid Valve: The tricuspid valve is normal in structure. Tricuspid valve regurgitation is mild. Aortic Valve: The aortic valve is normal in structure. Aortic valve regurgitation is not visualized. Aortic valve sclerosis is present, with no evidence of aortic valve stenosis. Aortic valve peak gradient measures 5.8 mmHg. Pulmonic Valve: The pulmonic valve was normal in structure. Pulmonic valve regurgitation is not visualized. Aorta: The ascending aorta was not well visualized. IAS/Shunts: No atrial level shunt detected by color flow Doppler. Additional Comments: 3D was performed not requiring image post processing on an independent workstation and was abnormal.  LEFT VENTRICLE PLAX 2D LVIDd:         2.75 cm     Diastology LVIDs:         3.60 cm     LV e' medial:    19.00 cm/s LV PW:         1.00 cm     LV E/e' medial:  5.6 LV IVS:        3.15 cm     LV e' lateral:   13.80 cm/s LVOT diam:     1.80 cm     LV E/e' lateral: 7.7 LV SV:         45 LV SV Index:   29          2D Longitudinal Strain LVOT Area:     2.54 cm    2D Strain GLS (A4C):   10.2 %                            2D Strain GLS (A3C):   11.4 %                             2D Strain GLS (A2C):   11.7 % LV Volumes (MOD)           2D Strain GLS Avg:     11.1 % LV vol d, MOD A2C: 68.8 ml LV vol d, MOD A4C: 81.0 ml LV vol s, MOD A2C: 42.5 ml LV vol s, MOD A4C: 47.9 ml 3D Volume EF: LV SV MOD A2C:     26.3 ml 3D EF:  40 % LV SV MOD A4C:     81.0 ml LV EDV:       102 ml LV SV MOD BP:      29.1 ml LV ESV:       61 ml                            LV SV:        41 ml RIGHT VENTRICLE RV Basal diam:  3.00 cm RV S prime:     14.90 cm/s TAPSE (M-mode): 2.0 cm LEFT ATRIUM             Index        RIGHT ATRIUM           Index LA diam:        3.20 cm 2.09 cm/m   RA Area:     10.30 cm LA Vol (A2C):   51.0 ml 33.33 ml/m  RA Volume:   26.00 ml  16.99 ml/m LA Vol (A4C):   35.8 ml 23.39 ml/m LA Biplane Vol: 44.5 ml 29.08 ml/m  AORTIC VALVE                 PULMONIC VALVE AV Area (Vmax): 2.23 cm     PV Vmax:        0.87 m/s AV Vmax:        120.00 cm/s  PV Peak grad:   3.0 mmHg AV Peak Grad:   5.8 mmHg     RVOT Peak grad: 2 mmHg LVOT Vmax:      105.00 cm/s LVOT Vmean:     65.800 cm/s LVOT VTI:       0.176 m  AORTA Ao Root diam: 3.40 cm MITRAL VALVE MV Area (PHT): 6.37 cm     SHUNTS MV Decel Time: 119 msec     Systemic VTI:  0.18 m MV E velocity: 106.00 cm/s  Systemic Diam: 1.80 cm Cara JONETTA Lovelace MD Electronically signed by Cara JONETTA Lovelace MD Signature Date/Time: 10/16/2023/9:49:51 AM    Final    DG Chest Portable 1 View Result Date: 10/15/2023 CLINICAL DATA:  COPD exacerbation EXAM: PORTABLE CHEST - 1 VIEW COMPARISON:  02/23/2020 FINDINGS: Pulmonary hyperinflation with attenuated bronchovascular markings in both upper lobes. No focal airspace disease. Mild interstitial prominence in the lung bases, increased from previous. Heart size and mediastinal contours are within normal limits. Aortic Atherosclerosis (ICD10-170.0). Mild blunting of the lateral costophrenic angles. Visualized bones unremarkable. IMPRESSION: 1. Pulmonary hyperinflation with mild bibasilar interstitial prominence.  2. Possible small pleural effusions. Electronically Signed   By: JONETTA Faes M.D.   On: 10/15/2023 21:15    ECHO as above  TELEMETRY reviewed by me 10/21/2023: sinus rhythm with PVCs, rate 8-s  EKG reviewed by me: atrial flutter, rate 158 bpm (new onset).  Data reviewed by me 10/21/2023: last 24h vitals tele labs imaging I/O hospitalist progress notes.  Principal Problem:   Acute respiratory failure (HCC) Active Problems:   COPD with acute exacerbation (HCC)   Acute pulmonary embolism (HCC)   DM (diabetes mellitus), type 2 (HCC)   Acute on chronic diastolic CHF (congestive heart failure) (HCC)   Non-ischemic cardiomyopathy (HCC)   Sinus tachycardia   Transaminitis   HTN (hypertension)   HLD (hyperlipidemia)   Osteoporosis   Acute clinical systolic heart failure (HCC)   Palliative care encounter   Protein-calorie malnutrition, severe    ASSESSMENT AND PLAN:  Monica Perritt  Stewart is a 78 y.o. female  with a past medical history of hypertension, hyperlipidemia, chronic respiratory failure (3L), COPD, pulmonary nodule and patient reports hx of atrial flutter (years ago) who presented to the ED on 10/15/2023 for worsening fatigue, lower extremity swelling, fatigue for months.  Patient endorses orthopnea.  EKG on 07/21 and telemetry new onset atrial flutter relation.  Echo during this admission revealed a reduced EF 35-45%. Cardiology was consulted for further evaluation.  # New onset HFrEF # Acute on chronic respiratory failure # COPD exacerbation BNP elevated at 1100.  Chest x-ray with pulmonary vascular congestion.  This admission reveals newly reduced EF of 35-40% with global hypokinesis. - Will hold p.o. Lasix  tomorrow due to alkalosis, Resume at lower dose 20 mg daily on 07/27. - Continue dapagliflozin  10 mg daily. (Copay $0) - Ordered Entresto  24-26 mg BID. (Copay $0) - Continue bisoprolol  5 mg daily. Will hold off on uptitarte, due to concern for low CO. - Continue spironolactone  25  mg daily. - Pulmonology following and managing COPD/Lung disease.   # New onset Atrial fibrillation/flutter RVR # Paroxsymal atrial fibrillation EKG (07/21) with atrial flutter, rate 158 bpm (new onset). Per tele episode atrial fibrillation RVR. Per tele now remains in sinus tachycardia rate 100s. Per tele a few episodes of AF/AFL RVR last night and early this morning (07/23 & 07/24) -Continue Eliquis  5 mg twice daily for stroke risk reduction.  CHA2DS2-VASc score is at least 6. Patient denies any recent falls, or prior major bleeding.  -Continue bisoprolol  as stated above. -Rhythm control medications limited due to severe underlying lung disease. -Continue Amio 400 mg BID for 10 days, then 200 mg daily for rhythm control. Not a long term option due to patients severe COPD. Consulted pulmonology and states no evidence of pulmonary fibrosis and gave okay to start Amio. -Cardiac monitor placed to assess AF/AFL, PVC burden.   # Hypertension # Hyperlipidemia Troponins minimally elevated and flat 33 > 48. EKG without acute ischemic changes. BP stable. -Continue losartan , bisoprolol  as stated above.  This patient's plan of care was discussed and created with Dr. Ammon and he is in agreement.  Signed: Dorene Comfort, PA-C  10/21/2023, 11:54 AM [facility] Cardiology

## 2023-10-21 NOTE — Progress Notes (Signed)
 Heart Failure Stewardship Pharmacy Note  PCP: Rudolpho Norleen BIRCH, MD PCP-Cardiologist: None  HPI: Monica Stewart is a 78 y.o. female with hypertension, hyperlipidemia, chronic respiratory failure (3L), COPD, pulmonary nodule, atrial flutter who presented with several month history of progressive fatigue, LEE, and orthopnea. On admission, BNP was 1150, HS-troponin was 33, AST 132, ALT 145, CRP 1.9, A1c of 6.7, and TSH was 0.627. Chest x-ray noted possible small pleural effusions. TTE this admission noted LVEF reduced to 35-40% with low normal RV function.   Pertinent Lab Values: Creatinine, Ser  Date Value Ref Range Status  10/21/2023 0.75 0.44 - 1.00 mg/dL Final   BUN  Date Value Ref Range Status  10/21/2023 43 (H) 8 - 23 mg/dL Final   Potassium  Date Value Ref Range Status  10/21/2023 5.0 3.5 - 5.1 mmol/L Final  07/18/2013 3.5 3.5 - 5.1 mmol/L Final   Sodium  Date Value Ref Range Status  10/21/2023 142 135 - 145 mmol/L Final   B Natriuretic Peptide  Date Value Ref Range Status  10/15/2023 1,150.0 (H) 0.0 - 100.0 pg/mL Final    Comment:    Performed at [facility], [address] [place], [place] [address]   Magnesium   Date Value Ref Range Status  10/18/2023 2.4 1.7 - 2.4 mg/dL Final    Comment:    Performed at [facility], [address] [place], [place] [address]   Hgb A1c MFr Bld  Date Value Ref Range Status  10/16/2023 6.7 (H) 4.8 - 5.6 % Final    Comment:    (NOTE) Diagnosis of Diabetes The following HbA1c ranges recommended by the American Diabetes Association (ADA) may be used as an aid in the diagnosis of diabetes mellitus.  Hemoglobin             Suggested A1C NGSP%              Diagnosis  <5.7                   Non Diabetic  5.7-6.4                Pre-Diabetic  >6.4                   Diabetic  <7.0                   Glycemic control for                       adults with diabetes.     TSH  Date Value Ref Range Status   10/15/2023 0.627 0.350 - 4.500 uIU/mL Final    Comment:    Performed by a 3rd Generation assay with a functional sensitivity of <=0.01 uIU/mL. Performed at [facility], [address] [place], [place] [address]    LDH  Date Value Ref Range Status  02/23/2020 220 (H) 98 - 192 U/L Final    Comment:    Performed at [facility], [address] [place], [place] [address]    Vital Signs: Temp:  [97.6 F (36.4 C)-98.6 F (37 C)] 97.9 F (36.6 C) (07/25 0725) Pulse Rate:  [79-96] 82 (07/25 0800) Cardiac Rhythm: Normal sinus rhythm (07/24 1903) Resp:  [12-21] 21 (07/25 0800) BP: (106-118)/(67-84) 108/67 (07/25 0800) SpO2:  [99 %-100 %] 100 % (07/25 0800)  Intake/Output Summary (Last 24 hours) at 10/21/2023 1007 Last data filed at 10/20/2023 1943 Gross per  24 hour  Intake 600 ml  Output 500 ml  Net 100 ml    Current Heart Failure Medications:  Loop diuretic: furosemide  40 mg PO daily Beta-Blocker: bisoprolol  5 mg daily ACEI/ARB/ARNI: Entresto  24-26 mg BID MRA: spironolactone  25 mg daily SGLT2i: Farxiga  10 mg daily Other: none  Prior to admission Heart Failure Medications:  Loop diuretic: furosemide  20 mg daily Beta-Blocker: none ACEI/ARB/ARNI: losartan  100 mg daily MRA: none SGLT2i: none Other: amlodipine 2.5 mg daily  Assessment: 1. Acute on chronic systolic heart failure (LVEF 35-40%) low normal RV function, due to NICM. NYHA class IV symptoms.  -Symptoms: Shortness of breath is improved. Patient is feeling much improved. -Volume: Patient appears to be euvolemic to potentially somewhat dry. Creatinine and BUN are slightly up on furosemide  40 mg daily. Contraction alkalosis is worsening again. Consider decreasing to 20 mg daily. The addition of GDMT will augment diuresis compared to PTA. -Hemodynamics: BP and HR stable this AM. -BB: Doing well on bisoprolol  5 mg daily.  -ACEI/ARB/ARNI: Losartan  transitioned to Entresto  24-26 mg BID. -MRA: Continue  spironolactone  12.5 mg daily. Potassium is up likely due to oral K supp and beign hypovolemic.  -SGLT2i: Continue Farxiga  10 mg daily.  Plan: 1) Medication changes recommended at this time: -Consider holding furosemide  a day then resuming 20 mg daily  2) Patient assistance: -Copays for Farxiga , Jardiance, Entresto  are $0 -Patient reports she would appreciate mail order prescriptions from [facility]  3) Education: - Patient has been educated on current HF medications and potential additions to HF medication regimen - Patient verbalizes understanding that over the next few months, these medication doses may change and more medications may be added to optimize HF regimen - Patient has been educated on basic disease state pathophysiology and goals of therapy  Medication Assistance / Insurance Benefits Check: Does the patient have prescription insurance?    Type of insurance plan:  Does the patient qualify for medication assistance through manufacturers or grants? No   Outpatient Pharmacy: Prior to admission outpatient pharmacy: [facility]      Please do not hesitate to reach out with questions or concerns,  Jaun Bash, PharmD, CPP, BCPS, [facility] Heart Failure Pharmacist  Phone - [phone] 10/21/2023 10:07 AM

## 2023-10-21 NOTE — Plan of Care (Signed)
  Problem: Education: Goal: Knowledge of General Education information will improve Description: Including pain rating scale, medication(s)/side effects and non-pharmacologic comfort measures Outcome: Progressing   Problem: Health Behavior/Discharge Planning: Goal: Ability to manage health-related needs will improve Outcome: Progressing   Problem: Clinical Measurements: Goal: Will remain free from infection Outcome: Progressing   Problem: Clinical Measurements: Goal: Respiratory complications will improve Outcome: Progressing   Problem: Clinical Measurements: Goal: Cardiovascular complication will be avoided Outcome: Progressing   Problem: Pain Managment: Goal: General experience of comfort will improve and/or be controlled Outcome: Progressing   Problem: Safety: Goal: Ability to remain free from injury will improve Outcome: Progressing

## 2023-10-21 NOTE — Progress Notes (Signed)
 PULMONOLOGY         Date: 10/21/2023,   MRN# [id number] KIYONA MCNALL 1945/05/26     AdmissionWeight: 50.9 kg                 CurrentWeight: 52.9 kg  Referring provider: Dr Core   CHIEF COMPLAINT:    Acute on chronic hypoxemic and hypercapnic respiratory failure  HISTORY OF PRESENT ILLNESS   This is a 78 yo F with stage 4 COPD and chronic hypoxemia on 3L/min [place] and recurrent hypercapnic encephalopathy.  Daughter reports courses of confusion at home even at rest. She is weak with deconditioning with baseline mMRC at 4.  She also has advanced Systolic CHF with EF <35% And a list of additional comorbidities. She came in to ER with worsening respiratory distress.  She has been compliant with combination ICS/LABA/LAMA at home with Trelegy once dialy. She also takes chronic prednisone  5mg  and Daliresp . She required BIPAP on arrival and is in respiratory distress. ABG with severe hypercapnic hypoxemic resp failure.  PCCM consultation for further evaluation and management.   10/21/23- patient is now at 2-3L/min vi anasal canula and saturating >95%.  She is being optmized for dc home. NIV has been ordered.  Patient requires Rehab dc due to physical deconditioning.   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   CARDIAC CATHETERIZATION     CATARACT EXTRACTION W/PHACO Right 01/23/2015   Procedure: CATARACT EXTRACTION PHACO AND INTRAOCULAR LENS PLACEMENT (IOC);  Surgeon: Newell Ovens, MD;  Location: [facility] ORS;  Service: Ophthalmology;  Laterality: Right;  US             1.09 AP             17.7 CDE         12.16 casette lot  # [id number]   EYE SURGERY     TUBAL LIGATION       FAMILY HISTORY   Family History  Problem Relation Age of Onset   Breast cancer Sister 34   Breast cancer Paternal Aunt      SOCIAL HISTORY   Social History   Tobacco Use   Smoking status: Former    Current packs/day: 0.00    Types: Cigarettes    Quit date: 01/22/1989    Years since quitting: 34.7   Smokeless tobacco: Never  Substance Use Topics   Alcohol  use: No   Drug use: No     MEDICATIONS    Home Medication:    Current Medication:  Current Facility-Administered Medications:    acetaminophen  (TYLENOL ) tablet 650 mg, 650 mg, Oral, Q6H PRN **OR** acetaminophen  (TYLENOL ) suppository 650 mg, 650 mg, Rectal, Q6H PRN, Core, Prentice BROCKS, MD   albuterol  (PROVENTIL ) (2.5 MG/3ML) 0.083% nebulizer solution 2.5 mg, 2.5 mg, Nebulization, Q2H PRN, Core, Prentice BROCKS, MD, 2.5 mg at 10/16/23 1106   ALPRAZolam  (XANAX ) tablet 0.25 mg, 0.25 mg, Oral, TID PRN, Patel, Sona, MD, 0.25 mg at 10/20/23 1737   amiodarone  (PACERONE ) tablet 400 mg, 400 mg, Oral, BID, 400 mg at 10/21/23 0924 **  FOLLOWED BY** [START ON 10/30/2023] amiodarone  (PACERONE ) tablet 200 mg, 200 mg, Oral, Daily, Decoste, Gabriella, PA-C   apixaban  (ELIQUIS ) tablet 5 mg, 5 mg, Oral, BID, Decoste, Gabriella, PA-C, 5 mg at 10/21/23 9072   bisoprolol  (ZEBETA ) tablet 5 mg, 5 mg, Oral, Daily, Decoste, Gabriella, PA-C, 5 mg at 10/21/23 9070   calcium -vitamin D  (OSCAL WITH D) 500-5 MG-MCG per tablet 1 tablet, 1 tablet, Oral, Daily, Core, Prentice BROCKS, MD, 1 tablet at 10/21/23 9072   dapagliflozin  propanediol (FARXIGA ) tablet 10 mg, 10 mg, Oral, Daily, Decoste, Gabriella, PA-C, 10 mg at 10/21/23 9070   fluticasone  (FLONASE ) 50 MCG/ACT nasal spray 2 spray, 2 spray, Each Nare, Daily, Core, Prentice BROCKS, MD, 2 spray at 10/19/23 1003   [START ON 10/23/2023] furosemide  (LASIX ) tablet 20 mg, 20 mg, Oral, Daily, Decoste, Gabriella, PA-C   insulin  aspart (novoLOG ) injection 0-5 Units, 0-5 Units,  Subcutaneous, QHS, Patel, Sona, MD   insulin  aspart (novoLOG ) injection 0-9 Units, 0-9 Units, Subcutaneous, TID WC, Patel, Sona, MD, 1 Units at 10/21/23 9076   lactose free nutrition (BOOST PLUS) liquid 237 mL, 237 mL, Oral, BID BM, Patel, Sona, MD, 237 mL at 10/21/23 9056   levalbuterol  (XOPENEX ) nebulizer solution 0.63 mg, 0.63 mg, Nebulization, Q6H, Patel, Sona, MD, 0.63 mg at 10/21/23 1343   multivitamin with minerals tablet 1 tablet, 1 tablet, Oral, Daily, Core, Prentice BROCKS, MD, 1 tablet at 10/21/23 9073   ondansetron  (ZOFRAN ) tablet 4 mg, 4 mg, Oral, Q6H PRN **OR** ondansetron  (ZOFRAN ) injection 4 mg, 4 mg, Intravenous, Q6H PRN, Core, Prentice BROCKS, MD   polyethylene glycol (MIRALAX  / GLYCOLAX ) packet 17 g, 17 g, Oral, Daily PRN, Core, Prentice BROCKS, MD   sacubitril -valsartan  (ENTRESTO ) 24-26 mg per tablet, 1 tablet, Oral, BID, Decoste, Gabriella, PA-C, 1 tablet at 10/21/23 9073   spironolactone  (ALDACTONE ) tablet 12.5 mg, 12.5 mg, Oral, Daily, Decoste, Gabriella, PA-C, 12.5 mg at 10/21/23 9070   umeclidinium-vilanterol (ANORO ELLIPTA ) 62.5-25 MCG/ACT 1 puff, 1 puff, Inhalation, Daily, Core, Prentice BROCKS, MD, 1 puff at 10/21/23 0900    ALLERGIES   Lisinopril-hydrochlorothiazide      REVIEW OF SYSTEMS    Review of Systems:  Gen:  Denies  fever, sweats, chills weigh loss  HEENT: Denies blurred vision, double vision, ear pain, eye pain, hearing loss, nose bleeds, sore throat Cardiac:  No dizziness, chest pain or heaviness, chest tightness,edema Resp:   reports dyspnea chronically  Gi: Denies swallowing difficulty, stomach pain, nausea or vomiting, diarrhea, constipation, bowel incontinence Gu:  Denies bladder incontinence, burning urine Ext:   Denies Joint pain, stiffness or swelling Skin: Denies  skin rash, easy bruising or bleeding or hives Endoc:  Denies polyuria, polydipsia , polyphagia or weight change Psych:   Denies depression, insomnia or hallucinations   Other:  All other systems  negative   VS: BP 97/83 (BP Location: Left Arm)   Pulse 78   Temp 98.3 F (36.8 C)   Resp 14   Ht 5' 4 (1.626 m)   Wt 52.9 kg   SpO2 99%   BMI 20.02 kg/m      PHYSICAL EXAM    GENERAL:NAD, no fevers, chills, no weakness no fatigue HEAD: Normocephalic, atraumatic.  EYES: Pupils equal, round, reactive to light. Extraocular muscles intact. No scleral icterus.  MOUTH: Moist mucosal membrane. Dentition intact. No abscess noted.  EAR, NOSE, THROAT: Clear without exudates. No external lesions.  NECK: Supple. No thyromegaly. No nodules. No JVD.  PULMONARY: decreased breath sounds with mild rhonchi worse at  bases bilaterally.  CARDIOVASCULAR: S1 and S2. Regular rate and rhythm. No murmurs, rubs, or gallops. No edema. Pedal pulses 2+ bilaterally.  GASTROINTESTINAL: Soft, nontender, nondistended. No masses. Positive bowel sounds. No hepatosplenomegaly.  MUSCULOSKELETAL: No swelling, clubbing, or edema. Range of motion full in all extremities.  NEUROLOGIC: Cranial nerves II through XII are intact. No gross focal neurological deficits. Sensation intact. Reflexes intact.  SKIN: No ulceration, lesions, rashes, or cyanosis. Skin warm and dry. Turgor intact.  PSYCHIATRIC: Mood, affect within normal limits. The patient is awake, alert and oriented x 3. Insight, judgment intact.       IMAGING   CLINICAL DATA: COPD exacerbation  EXAM: PORTABLE CHEST - 1 VIEW  COMPARISON: 02/23/2020  FINDINGS: Pulmonary hyperinflation with attenuated bronchovascular markings in both upper lobes. No focal airspace disease. Mild interstitial prominence in the lung bases, increased from previous.  Heart size and mediastinal contours are within normal limits. Aortic Atherosclerosis (ICD10-170.0).  Mild blunting of the lateral costophrenic angles.  Visualized bones unremarkable.  IMPRESSION: 1. Pulmonary hyperinflation with mild bibasilar interstitial prominence. 2. Possible small pleural  effusions.   Electronically Signed By: JONETTA Faes M.D. On: 10/15/2023 21:15   ASSESSMENT/PLAN   Acute on chronic hypoxemic hypercapnic respiratory failure-RESOLVED     -due to Severe Acute COPD exacerbation    -possible culprit is viral vs bacterial infection     - CRP trend    - procalcitonin trend    - respiratory culture    - resp viral panel    - COVID/FLU/RSV negative    - continue COPD care path with steroids, nebs, abx    - IS as able , flutter as able     - repeat CXR in 48hr    -legionella and strep pneumoniae testing    -BODE score >8 with <20% long term survival    - palliative care consultation for possible comfort care / hospice evaluation    -pred reduced to 35mg    Hypercapnic respiratory failure with Encephalopathy    - continue BIPAP at bedtime at home via NIV     Bibasilar atelelctasis      Chest physiotherapy      - continue BIPAP      - PT/OT     Advanced Systolic CHF      - continue diuresis prn      - trans thoracic echo with both left and right heart dysfunction - ddimer to rule out PE      - LVEF <35%      Thank you for allowing me to participate in the care of this patient.   Patient/Family are satisfied with care plan and all questions have been answered.    Provider disclosure: Patient with at least one acute or chronic illness or injury that poses a threat to life or bodily function and is being managed actively during this encounter.  All of the below services have been performed independently by signing provider:  review of prior documentation from internal and or external health records.  Review of previous and current lab results.  Interview and comprehensive assessment during patient visit today. Review of current and previous chest radiographs/CT scans. Discussion of management and test interpretation with health care team and patient/family.   This document was prepared using Dragon voice recognition software and may include  unintentional dictation errors.     Amanii Snethen, M.D.  Division of Pulmonary & Critical Care Medicine

## 2023-10-22 ENCOUNTER — Inpatient Hospital Stay

## 2023-10-22 DIAGNOSIS — R7401 Elevation of levels of liver transaminase levels: Secondary | ICD-10-CM

## 2023-10-22 DIAGNOSIS — I5021 Acute systolic (congestive) heart failure: Secondary | ICD-10-CM

## 2023-10-22 DIAGNOSIS — J9622 Acute and chronic respiratory failure with hypercapnia: Secondary | ICD-10-CM

## 2023-10-22 DIAGNOSIS — I9589 Other hypotension: Secondary | ICD-10-CM

## 2023-10-22 DIAGNOSIS — I480 Paroxysmal atrial fibrillation: Secondary | ICD-10-CM | POA: Insufficient documentation

## 2023-10-22 DIAGNOSIS — J9621 Acute and chronic respiratory failure with hypoxia: Secondary | ICD-10-CM | POA: Diagnosis not present

## 2023-10-22 DIAGNOSIS — E43 Unspecified severe protein-calorie malnutrition: Secondary | ICD-10-CM

## 2023-10-22 DIAGNOSIS — I959 Hypotension, unspecified: Secondary | ICD-10-CM

## 2023-10-22 DIAGNOSIS — R319 Hematuria, unspecified: Secondary | ICD-10-CM | POA: Insufficient documentation

## 2023-10-22 DIAGNOSIS — J441 Chronic obstructive pulmonary disease with (acute) exacerbation: Secondary | ICD-10-CM | POA: Diagnosis not present

## 2023-10-22 LAB — URINALYSIS, COMPLETE (UACMP) WITH MICROSCOPIC
Bilirubin Urine: NEGATIVE
Glucose, UA: >=500 mg/dL — AB
Ketones, ur: NEGATIVE mg/dL
Nitrite: NEGATIVE
Protein, ur: NEGATIVE mg/dL
Specific Gravity, Urine: 1.026 (ref 1.005–1.030)
pH: 7 (ref 5.0–8.0)

## 2023-10-22 LAB — BASIC METABOLIC PANEL WITH GFR
Anion gap: 14 (ref 5–15)
BUN: 42 mg/dL — ABNORMAL HIGH (ref 8–23)
CO2: 42 mmol/L — ABNORMAL HIGH (ref 22–32)
Calcium: 10.3 mg/dL (ref 8.9–10.3)
Chloride: 87 mmol/L — ABNORMAL LOW (ref 98–111)
Creatinine, Ser: 0.64 mg/dL (ref 0.44–1.00)
GFR, Estimated: >60 mL/min (ref >60)
Glucose, Bld: 123 mg/dL — ABNORMAL HIGH (ref 70–99)
Potassium: 4.9 mmol/L (ref 3.5–5.1)
Sodium: 143 mmol/L (ref 135–145)

## 2023-10-22 LAB — CBC
HCT: 43.6 % (ref 36.0–46.0)
Hemoglobin: 13.6 g/dL (ref 12.0–15.0)
MCH: 29.2 pg (ref 26.0–34.0)
MCHC: 31.2 g/dL (ref 30.0–36.0)
MCV: 93.6 fL (ref 80.0–100.0)
Platelets: 237 K/uL (ref 150–400)
RBC: 4.66 MIL/uL (ref 3.87–5.11)
RDW: 14.2 % (ref 11.5–15.5)
WBC: 11.3 K/uL — ABNORMAL HIGH (ref 4.0–10.5)
nRBC: 0 % (ref 0.0–0.2)

## 2023-10-22 LAB — GLUCOSE, CAPILLARY
Glucose-Capillary: 127 mg/dL — ABNORMAL HIGH (ref 70–99)
Glucose-Capillary: 140 mg/dL — ABNORMAL HIGH (ref 70–99)
Glucose-Capillary: 220 mg/dL — ABNORMAL HIGH (ref 70–99)
Glucose-Capillary: 174 mg/dL — ABNORMAL HIGH (ref 70–99)

## 2023-10-22 MED ORDER — CALCIUM CARBONATE ANTACID 500 MG PO CHEW
1.0000 | CHEWABLE_TABLET | Freq: Three times a day (TID) | ORAL | Status: DC | PRN
  Administered 2023-10-22: 200 mg via ORAL
  Filled 2023-10-22: qty 1

## 2023-10-22 MED ORDER — FAMOTIDINE 20 MG PO TABS
20.0000 mg | ORAL_TABLET | Freq: Every day | ORAL | Status: DC
  Administered 2023-10-22 – 2023-10-25 (×4): 20 mg via ORAL
  Filled 2023-10-22 (×4): qty 1

## 2023-10-22 NOTE — Assessment & Plan Note (Signed)
 Blood pressure in the 70s after giving Entresto  today.  Spoke with cardiology and they will hold Entresto  for now.  Continue to monitor blood pressure.

## 2023-10-22 NOTE — Progress Notes (Addendum)
 Progress Note   Patient: Monica Stewart FMW:[id number] DOB: [birth year] DOA: 10/15/2023     6 DOS: the patient was seen and examined on 10/22/2023   Brief hospital course: 78 year old female with a past medical history of chronic respiratory failure on 3 L home oxygen , chronic obstructive pulmonary disorder FEV1 of 0.6 L, pulmonary nodule, gastroesophageal reflux disease who presents to the emergency department with increasing shortness of breath and dependent edema the patient is talking fluently on BiPAP reporting shortness of breath worsening since yesterday she reports increased sputum production and slightly increased cough denies any fever or chills she also reports approximately 2 months of increasing dependent edema she does endorse orthopnea. Patient does not smoke currently however was a heavy smoker many years ago. She follows with Dr. Theotis of pulmonary as outpatient  7/26.  Blood pressure in the 70s after Entresto  dose.  Case discussed with cardiology and they will hold Entresto  at this point and continue to monitor blood pressure.  Assessment and Plan: * Acute on chronic respiratory failure with hypoxia and hypercapnia (HCC) Chronically on 3 L.  Required BiPAP during the hospital course.  Currently on 2 L this morning.  Initially required IV steroids but now on oral  Acute systolic (congestive) heart failure (HCC) EF of 35 to 40%.  Blood pressure low after giving Entresto  today.  Hold Entresto .  Currently on spironolactone , oral Lasix , Farxiga  and Zebeta   Hypotension Blood pressure in the 70s after giving Entresto  today.  Spoke with cardiology and they will hold Entresto  for now.  Continue to monitor blood pressure.  COPD with acute exacerbation (HCC) Currently on oral prednisone .  Required IV Solu-Medrol  earlier in the hospital course.  DM (diabetes mellitus), type 2 (HCC) Continue sliding scale insulin .  Hemoglobin A1c 6.7.  Sugars elevated secondary to  steroids.  Hematuria Hesitant on stopping Eliquis  at this point.  Check a hemoglobin tomorrow morning.  Send off her urine analysis and reflex to culture.  Sonogram kidney and bladder.  Paroxysmal atrial fibrillation (HCC) Patient on amiodarone , Zebeta  and Eliquis .  Protein-calorie malnutrition, severe Continue supplements  Osteoporosis  Osteoporosis Continue bisphosphonate as outpatient   HLD (hyperlipidemia) Holding statin with elevated liver function test   Transaminitis Holding statin.  Continue to monitor liver function tests.  Likely from heart failure.        Subjective: Patient feeling okay.  Her leg swelling is better.  Admitted with heart failure.  This morning when I saw her blood pressure was in the 70s.  Did not feel lightheaded or short of breath.  Physical Exam: Vitals:   10/22/23 0500 10/22/23 0600 10/22/23 0827 10/22/23 0956  BP: 110/80  (!) 77/56 101/60  Pulse:   80 73  Resp:   (!) 21 19  Temp: (!) 97.1 F (36.2 C) 97.6 F (36.4 C) (!) 97.5 F (36.4 C)   TempSrc: Oral Oral Oral   SpO2:   100% 93%  Weight:      Height:       Physical Exam HENT:     Head: Normocephalic.     Mouth/Throat:     Pharynx: No oropharyngeal exudate.  Eyes:     General: Lids are normal.     Conjunctiva/sclera: Conjunctivae normal.  Cardiovascular:     Rate and Rhythm: Normal rate and regular rhythm.     Heart sounds: Normal heart sounds, S1 normal and S2 normal.  Pulmonary:     Breath sounds: Examination of the right-lower field reveals decreased breath sounds.  Examination of the left-lower field reveals decreased breath sounds. Decreased breath sounds present. No wheezing, rhonchi or rales.  Abdominal:     Palpations: Abdomen is soft.     Tenderness: There is no abdominal tenderness.  Musculoskeletal:     Right lower leg: No swelling.     Left lower leg: No swelling.  Skin:    General: Skin is warm.     Findings: No rash.  Neurological:     Mental Status:  She is alert.     Data Reviewed: Creatinine 0.64, potassium 4.9, CO2 42, white blood cell count 11.3, hemoglobin 13.6, platelet count 237, echocardiogram shows an EF of 35%  Family Communication: Spoke with family at the bedside  Disposition: Status is: Inpatient Remains inpatient appropriate because: Blood pressure in the 70s after given Entresto  this morning.  Case discussed with cardiology and they will hold Entresto  and continue to adjust medications.  Planned Discharge Destination: Potentially rehab    Time spent: 28 minutes Case discussed with cardiology and nursing staff.  Author: Charlie Patterson, MD 10/22/2023 11:58 AM  For on call review [email or web address].

## 2023-10-22 NOTE — Assessment & Plan Note (Signed)
 EF of 35 to 40%.  Blood pressure low after giving Entresto  today.  Hold Entresto .  Currently on spironolactone , oral Lasix , Farxiga  and Zebeta 

## 2023-10-22 NOTE — Assessment & Plan Note (Signed)
 Patient on amiodarone , Zebeta  and Eliquis .

## 2023-10-22 NOTE — Hospital Course (Signed)
 78 year old female with a past medical history of chronic respiratory failure on 3 L home oxygen , chronic obstructive pulmonary disorder FEV1 of 0.6 L, pulmonary nodule, gastroesophageal reflux disease who presents to the emergency department with increasing shortness of breath and dependent edema the patient is talking fluently on BiPAP reporting shortness of breath worsening since yesterday she reports increased sputum production and slightly increased cough denies any fever or chills she also reports approximately 2 months of increasing dependent edema she does endorse orthopnea. Patient does not smoke currently however was a heavy smoker many years ago. She follows with Dr. Theotis of pulmonary as outpatient  7/26.  Blood pressure in the 70s after Entresto  dose.  Case discussed with cardiology and they will hold Entresto  at this point and continue to monitor blood pressure.

## 2023-10-22 NOTE — Assessment & Plan Note (Signed)
 Hesitant on stopping Eliquis  at this point.  Check a hemoglobin tomorrow morning.  Send off her urine analysis and reflex to culture.  Sonogram kidney and bladder.

## 2023-10-22 NOTE — Assessment & Plan Note (Signed)
 Chronically on 3 L.  Required BiPAP during the hospital course.  Currently on 2 L this morning.  Initially required IV steroids but now on oral

## 2023-10-22 NOTE — Assessment & Plan Note (Signed)
 Continue supplements

## 2023-10-22 NOTE — Progress Notes (Signed)
 [facility] CLINIC CARDIOLOGY PROGRESS NOTE       Patient ID: Monica Stewart MRN: [id number] DOB/AGE: [birth year] 78 y.o.  Admit date: 10/15/2023 Referring Physician Dr. Tobie Primary Physician Rudolpho Norleen BIRCH, MD Primary Cardiologist Dr. Bosie (2015) Reason for Consultation Newly reduced EF  HPI: Monica Stewart is a 78 y.o. female  with a past medical history of hypertension, hyperlipidemia, chronic respiratory failure (3L), COPD, pulmonary nodule and patient reports hx of atrial flutter (years ago) who presented to the ED on 10/15/2023 for worsening fatigue, lower extremity swelling, fatigue for months.  Patient endorses orthopnea.  EKG on 07/21 and telemetry new onset atrial flutter relation.  Echo during this admission revealed a reduced EF 35-45%. Cardiology was consulted for further evaluation.   Interval History: -Patient seen and examined this AM sitting upright in bed with family at bedside. Patient states she feels good today.  States SOB is improving.  Continues to deny chest pain, palpitations or lightheadedness.  -LEE resolved -BP low this AM after receiving meds. Will stop Entresto .  -Patient remains on 2L with stable SpO2. Wean as able. -Recommend patient continue to work with PT.    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   CARDIAC CATHETERIZATION     CATARACT EXTRACTION W/PHACO Right 01/23/2015   Procedure: CATARACT EXTRACTION PHACO AND INTRAOCULAR LENS PLACEMENT (IOC);  Surgeon: Newell Ovens, MD;  Location: [facility] ORS;  Service: Ophthalmology;  Laterality: Right;  US             1.09 AP              17.7 CDE         12.16 casette lot # [id number]   EYE SURGERY     TUBAL LIGATION      Medications Prior to Admission  Medication Sig Dispense Refill Last Dose/Taking   albuterol  (VENTOLIN  HFA) 108 (90 Base) MCG/ACT inhaler Inhale 1-2 puffs into the lungs every 6 (six) hours as needed for wheezing or shortness of breath.   10/16/2023   alendronate (FOSAMAX) 70 MG tablet Take 70 mg by mouth once a week.   10/15/2023 Evening   amLODipine (NORVASC) 2.5 MG tablet Take 2.5 mg by mouth daily.   10/15/2023 Evening   Azelastine HCl 137 MCG/SPRAY SOLN Place 1 spray into both nostrils 2 (two) times daily.   10/15/2023 Evening   calcium  citrate-vitamin D  500-400 MG-UNIT chewable tablet Chew 1 tablet by mouth daily.    10/15/2023 Evening   Ferrous Fumarate (HEMOCYTE - 106 MG FE) 324 (106 Fe) MG TABS tablet Take 1 tablet by mouth every other day.   Unknown   fluticasone  (FLONASE ) 50 MCG/ACT nasal spray Place 2 sprays into both nostrils daily.   10/15/2023 Evening   furosemide  (LASIX ) 20 MG tablet Take 20 mg by mouth daily.   10/16/2023 Morning   Iron-Vitamins (GERITOL PO) Take 1 tablet by mouth daily.   Unknown   loratadine  (CLARITIN ) 10 MG tablet Take 10 mg by mouth daily.   10/15/2023 Evening   losartan  (COZAAR )  100 MG tablet Take 100 mg by mouth daily.   10/15/2023 Evening   lovastatin (MEVACOR) 40 MG tablet Take 40 mg by mouth at bedtime.   10/15/2023 Evening   meloxicam (MOBIC) 7.5 MG tablet Take 7.5 mg by mouth daily.   10/16/2023 Morning   Multiple Vitamins-Minerals (MULTIVITAMIN WITH MINERALS) tablet Take 1 tablet by mouth daily.   10/16/2023 Morning   NON FORMULARY Take 1 capsule by mouth daily.   Unknown   predniSONE  (DELTASONE ) 5 MG tablet Take 5 mg by mouth daily.   10/16/2023 Morning   Roflumilast 250 MCG TABS Take 250 mcg by mouth daily.   10/15/2023 Evening   TRELEGY ELLIPTA 100-62.5-25 MCG/INH AEPB Inhale 1 puff into the lungs daily.   10/16/2023 Morning   hydrochlorothiazide  (HYDRODIURIL ) 25 MG  tablet Take 25 mg by mouth daily. (Patient not taking: Reported on 10/16/2023)   Not Taking   Ipratropium-Albuterol  (COMBIVENT ) 20-100 MCG/ACT AERS respimat Inhale 1 puff into the lungs every 6 (six) hours for 7 days.  0    Social History   Socioeconomic History   Marital status: Divorced    Spouse name: Not on file   Number of children: Not on file   Years of education: Not on file   Highest education level: Not on file  Occupational History   Not on file  Tobacco Use   Smoking status: Former    Current packs/day: 0.00    Types: Cigarettes    Quit date: 01/22/1989    Years since quitting: 34.7   Smokeless tobacco: Never  Substance and Sexual Activity   Alcohol  use: No   Drug use: No   Sexual activity: Not on file  Other Topics Concern   Not on file  Social History Narrative   Not on file   Social Drivers of Health   Financial Resource Strain: Low Risk  (06/27/2023)   Received from [facility]   Overall Financial Resource Strain (CARDIA)    Difficulty of Paying Living Expenses: Not very hard  Food Insecurity: Food Insecurity Present (06/27/2023)   Received from [facility]   Hunger Vital Sign    Within the past 12 months, you worried that your food would run out before you got the money to buy more.: Never true    Within the past 12 months, the food you bought just didn't last and you didn't have money to get more.: Sometimes true  Transportation Needs: No Transportation Needs (06/27/2023)   Received from [facility] - Transportation    In the past 12 months, has lack of transportation kept you from medical appointments or from getting medications?: No    Lack of Transportation (Non-Medical): No  Physical Activity: Not on file  Stress: Not on file  Social Connections: Not on file  Intimate Partner Violence: Not on file    Family History  Problem Relation Age of Onset   Breast cancer Sister 27   Breast  cancer Paternal Aunt      Vitals:   10/22/23 0258 10/22/23 0500 10/22/23 0600 10/22/23 0827  BP:  110/80  (!) 77/56  Pulse:    80  Resp:    (!) 21  Temp:  (!) 97.1 F (36.2 C) 97.6 F (36.4 C) (!) 97.5 F (36.4 C)  TempSrc:  Oral Oral Oral  SpO2: 97%   100%  Weight:      Height:        PHYSICAL EXAM  General: Chronically ill appearing female, well nourished, in no acute distress. HEENT: Normocephalic and atraumatic. Neck: No JVD.   Lungs: Normal respiratory effort on 2L. Diminished breath sounds bilaterally Heart: HRR, elevated HR. Normal S1 and S2 without gallops or murmurs.  Abdomen: Non-distended appearing.  Msk: Normal strength and tone for age. Extremities: Warm and well perfused. No clubbing, cyanosis, edema.  Neuro: Alert and oriented X 3. Psych: Answers questions appropriately.   Labs: Basic Metabolic Panel: Recent Labs    10/21/23 0607 10/22/23 0525  NA 142 143  K 5.0 4.9  CL 87* 87*  CO2 44* 42*  GLUCOSE 155* 123*  BUN 43* 42*  CREATININE 0.75 0.64  CALCIUM  10.5* 10.3   Liver Function Tests: Recent Labs    10/20/23 0431  AST 50*  ALT 113*  ALKPHOS 66  BILITOT 0.4  PROT 6.2*  ALBUMIN 3.2*    No results for input(s): LIPASE, AMYLASE in the last 72 hours. CBC: Recent Labs    10/22/23 0525  WBC 11.3*  HGB 13.6  HCT 43.6  MCV 93.6  PLT 237   Cardiac Enzymes: No results for input(s): CKTOTAL, CKMB, CKMBINDEX, TROPONINIHS in the last 72 hours.  BNP: No results for input(s): BNP in the last 72 hours.  D-Dimer: No results for input(s): DDIMER in the last 72 hours.  Hemoglobin A1C: No results for input(s): HGBA1C in the last 72 hours.  Fasting Lipid Panel: No results for input(s): CHOL, HDL, LDLCALC, TRIG, CHOLHDL, LDLDIRECT in the last 72 hours. Thyroid Function Tests: No results for input(s): TSH, T4TOTAL, T3FREE, THYROIDAB in the last 72 hours.  Invalid input(s): FREET3  Anemia Panel: No  results for input(s): VITAMINB12, FOLATE, FERRITIN, TIBC, IRON, RETICCTPCT in the last 72 hours.   Radiology: ECHOCARDIOGRAM COMPLETE Result Date: 10/16/2023    ECHOCARDIOGRAM REPORT   Patient Name:   Monica Stewart Date of Exam: 10/16/2023 Medical Rec #:  [id number]       Height:       64.0 in Accession #:    [id number]      Weight:       112.2 lb Date of Birth:  1945/10/19       BSA:          1.530 m Patient Age:    77 years        BP:           137/88 mmHg Patient Gender: F               HR:           115 bpm. Exam Location:  [facility] Procedure: 2D Echo, 3D Echo, Cardiac Doppler, Color Doppler and Strain Analysis            (Both Spectral and Color Flow Doppler were utilized during            procedure). Indications:     Elevated Troponin  History:         Patient has prior history of Echocardiogram examinations, most                  recent 05/03/2023.  Sonographer:     Thedora Louder RDCS, FASE Referring Phys:  [id number] PRENTICE BROCKS CORE Diagnosing Phys: Cara JONETTA Lovelace MD  Sonographer Comments: Global longitudinal strain was attempted. IMPRESSIONS  1. Left ventricular ejection fraction, by estimation, is 35 to 40%. The left ventricle has moderately decreased function. The left ventricle demonstrates global hypokinesis. The left ventricular internal cavity  size was mildly dilated. Left ventricular diastolic function could not be evaluated. The average left ventricular global longitudinal strain is 11.1 %. The global longitudinal strain is abnormal.  2. Right ventricular systolic function is low normal. The right ventricular size is mildly enlarged.  3. The mitral valve is normal in structure. Trivial mitral valve regurgitation.  4. The aortic valve is normal in structure. Aortic valve regurgitation is not visualized. Aortic valve sclerosis is present, with no evidence of aortic valve stenosis. FINDINGS  Left Ventricle: Left ventricular ejection fraction, by estimation, is 35 to 40%. The left ventricle  has moderately decreased function. The left ventricle demonstrates global hypokinesis. The average left ventricular global longitudinal strain is 11.1 %.  Strain was performed and the global longitudinal strain is abnormal. The left ventricular internal cavity size was mildly dilated. There is no left ventricular hypertrophy. Left ventricular diastolic function could not be evaluated. Right Ventricle: The right ventricular size is mildly enlarged. No increase in right ventricular wall thickness. Right ventricular systolic function is low normal. Left Atrium: Left atrial size was normal in size. Right Atrium: Right atrial size was normal in size. Pericardium: There is no evidence of pericardial effusion. Mitral Valve: The mitral valve is normal in structure. Trivial mitral valve regurgitation. Tricuspid Valve: The tricuspid valve is normal in structure. Tricuspid valve regurgitation is mild. Aortic Valve: The aortic valve is normal in structure. Aortic valve regurgitation is not visualized. Aortic valve sclerosis is present, with no evidence of aortic valve stenosis. Aortic valve peak gradient measures 5.8 mmHg. Pulmonic Valve: The pulmonic valve was normal in structure. Pulmonic valve regurgitation is not visualized. Aorta: The ascending aorta was not well visualized. IAS/Shunts: No atrial level shunt detected by color flow Doppler. Additional Comments: 3D was performed not requiring image post processing on an independent workstation and was abnormal.  LEFT VENTRICLE PLAX 2D LVIDd:         2.75 cm     Diastology LVIDs:         3.60 cm     LV e' medial:    19.00 cm/s LV PW:         1.00 cm     LV E/e' medial:  5.6 LV IVS:        3.15 cm     LV e' lateral:   13.80 cm/s LVOT diam:     1.80 cm     LV E/e' lateral: 7.7 LV SV:         45 LV SV Index:   29          2D Longitudinal Strain LVOT Area:     2.54 cm    2D Strain GLS (A4C):   10.2 %                            2D Strain GLS (A3C):   11.4 %                             2D Strain GLS (A2C):   11.7 % LV Volumes (MOD)           2D Strain GLS Avg:     11.1 % LV vol d, MOD A2C: 68.8 ml LV vol d, MOD A4C: 81.0 ml LV vol s, MOD A2C: 42.5 ml LV vol s, MOD A4C: 47.9 ml 3D Volume EF: LV SV MOD A2C:  26.3 ml 3D EF:        40 % LV SV MOD A4C:     81.0 ml LV EDV:       102 ml LV SV MOD BP:      29.1 ml LV ESV:       61 ml                            LV SV:        41 ml RIGHT VENTRICLE RV Basal diam:  3.00 cm RV S prime:     14.90 cm/s TAPSE (M-mode): 2.0 cm LEFT ATRIUM             Index        RIGHT ATRIUM           Index LA diam:        3.20 cm 2.09 cm/m   RA Area:     10.30 cm LA Vol (A2C):   51.0 ml 33.33 ml/m  RA Volume:   26.00 ml  16.99 ml/m LA Vol (A4C):   35.8 ml 23.39 ml/m LA Biplane Vol: 44.5 ml 29.08 ml/m  AORTIC VALVE                 PULMONIC VALVE AV Area (Vmax): 2.23 cm     PV Vmax:        0.87 m/s AV Vmax:        120.00 cm/s  PV Peak grad:   3.0 mmHg AV Peak Grad:   5.8 mmHg     RVOT Peak grad: 2 mmHg LVOT Vmax:      105.00 cm/s LVOT Vmean:     65.800 cm/s LVOT VTI:       0.176 m  AORTA Ao Root diam: 3.40 cm MITRAL VALVE MV Area (PHT): 6.37 cm     SHUNTS MV Decel Time: 119 msec     Systemic VTI:  0.18 m MV E velocity: 106.00 cm/s  Systemic Diam: 1.80 cm Cara JONETTA Lovelace MD Electronically signed by Cara JONETTA Lovelace MD Signature Date/Time: 10/16/2023/9:49:51 AM    Final    DG Chest Portable 1 View Result Date: 10/15/2023 CLINICAL DATA:  COPD exacerbation EXAM: PORTABLE CHEST - 1 VIEW COMPARISON:  02/23/2020 FINDINGS: Pulmonary hyperinflation with attenuated bronchovascular markings in both upper lobes. No focal airspace disease. Mild interstitial prominence in the lung bases, increased from previous. Heart size and mediastinal contours are within normal limits. Aortic Atherosclerosis (ICD10-170.0). Mild blunting of the lateral costophrenic angles. Visualized bones unremarkable. IMPRESSION: 1. Pulmonary hyperinflation with mild bibasilar interstitial prominence.  2. Possible small pleural effusions. Electronically Signed   By: JONETTA Faes M.D.   On: 10/15/2023 21:15    ECHO as above  TELEMETRY reviewed by me 10/22/2023: sinus rhythm with PVCs, rate 80s  EKG reviewed by me: atrial flutter, rate 158 bpm (new onset).  Data reviewed by me 10/22/2023: last 24h vitals tele labs imaging I/O hospitalist progress notes.  Principal Problem:   Acute respiratory failure (HCC) Active Problems:   COPD with acute exacerbation (HCC)   Acute pulmonary embolism (HCC)   DM (diabetes mellitus), type 2 (HCC)   Acute on chronic diastolic CHF (congestive heart failure) (HCC)   Non-ischemic cardiomyopathy (HCC)   Sinus tachycardia   Transaminitis   HTN (hypertension)   HLD (hyperlipidemia)   Osteoporosis   Acute clinical systolic heart failure (HCC)   Palliative care encounter   Protein-calorie  malnutrition, severe    ASSESSMENT AND PLAN:  Monica Stewart is a 78 y.o. female  with a past medical history of hypertension, hyperlipidemia, chronic respiratory failure (3L), COPD, pulmonary nodule and patient reports hx of atrial flutter (years ago) who presented to the ED on 10/15/2023 for worsening fatigue, lower extremity swelling, fatigue for months.  Patient endorses orthopnea.  EKG on 07/21 and telemetry new onset atrial flutter relation.  Echo during this admission revealed a reduced EF 35-45%. Cardiology was consulted for further evaluation.  # New onset HFrEF # Acute on chronic respiratory failure # COPD exacerbation BNP elevated at 1100.  Chest x-ray with pulmonary vascular congestion.  This admission reveals newly reduced EF of 35-40% with global hypokinesis. - Will hold p.o. Lasix  due to alkalosis, Resume at lower dose 20 mg daily on 07/27. -Discontinue Entresto  due to hypotension. Will reassess BP tomorrow to evaluate tolerability of ARB vs ACE.  - Continue dapagliflozin  10 mg daily. (Copay $0) - Continue bisoprolol  5 mg daily. Will hold off on uptitarte,  due to concern for low CO. - Continue spironolactone  25 mg daily. - Pulmonology following and managing COPD/Lung disease.   # New onset Atrial fibrillation/flutter RVR # Paroxsymal atrial fibrillation EKG (07/21) with atrial flutter, rate 158 bpm (new onset). Per tele episode atrial fibrillation RVR. Per tele now remains in sinus tachycardia rate 100s. Per tele a few episodes of AF/AFL RVR last night and early this morning (07/23 & 07/24) -Continue Eliquis  5 mg twice daily for stroke risk reduction.  CHA2DS2-VASc score is at least 6. Patient denies any recent falls, or prior major bleeding.  -Continue bisoprolol  as stated above. -Rhythm control medications limited due to severe underlying lung disease. -Continue Amio 400 mg BID for 10 days, then 200 mg daily for rhythm control. Not a long term option due to patients severe COPD. Consulted pulmonology and states no evidence of pulmonary fibrosis and gave okay to start Amio. -Cardiac monitor placed to assess AF/AFL, PVC burden.   # Hypertension # Hyperlipidemia Troponins minimally elevated and flat 33 > 48. EKG without acute ischemic changes. BP stable. -Continue losartan , bisoprolol  as stated above.  This patient's plan of care was discussed and created with Dr. Florencio and he is in agreement.  Signed: Danita Bloch, PA-C  10/22/2023, 9:56 AM [facility] Cardiology

## 2023-10-23 DIAGNOSIS — J9622 Acute and chronic respiratory failure with hypercapnia: Secondary | ICD-10-CM | POA: Diagnosis not present

## 2023-10-23 DIAGNOSIS — J9621 Acute and chronic respiratory failure with hypoxia: Secondary | ICD-10-CM | POA: Diagnosis not present

## 2023-10-23 LAB — GLUCOSE, CAPILLARY
Glucose-Capillary: 90 mg/dL (ref 70–99)
Glucose-Capillary: 160 mg/dL — ABNORMAL HIGH (ref 70–99)
Glucose-Capillary: 231 mg/dL — ABNORMAL HIGH (ref 70–99)
Glucose-Capillary: 182 mg/dL — ABNORMAL HIGH (ref 70–99)

## 2023-10-23 LAB — COMPREHENSIVE METABOLIC PANEL WITH GFR
ALT: 47 U/L — ABNORMAL HIGH (ref 0–44)
AST: 23 U/L (ref 15–41)
Albumin: 2.9 g/dL — ABNORMAL LOW (ref 3.5–5.0)
Alkaline Phosphatase: 61 U/L (ref 38–126)
Anion gap: 9 (ref 5–15)
BUN: 49 mg/dL — ABNORMAL HIGH (ref 8–23)
CO2: 42 mmol/L — ABNORMAL HIGH (ref 22–32)
Calcium: 10.4 mg/dL — ABNORMAL HIGH (ref 8.9–10.3)
Chloride: 86 mmol/L — ABNORMAL LOW (ref 98–111)
Creatinine, Ser: 0.95 mg/dL (ref 0.44–1.00)
GFR, Estimated: >60 mL/min (ref >60)
Glucose, Bld: 129 mg/dL — ABNORMAL HIGH (ref 70–99)
Potassium: 4.6 mmol/L (ref 3.5–5.1)
Sodium: 137 mmol/L (ref 135–145)
Total Bilirubin: 0.5 mg/dL (ref 0.0–1.2)
Total Protein: 5.8 g/dL — ABNORMAL LOW (ref 6.5–8.1)

## 2023-10-23 LAB — HEMOGLOBIN: Hemoglobin: 12.7 g/dL (ref 12.0–15.0)

## 2023-10-23 MED ORDER — LEVALBUTEROL HCL 0.63 MG/3ML IN NEBU: 0.6300 mg | INHALATION_SOLUTION | Freq: Three times a day (TID) | RESPIRATORY_TRACT | Status: DC | PRN

## 2023-10-23 MED ORDER — BISOPROLOL FUMARATE 5 MG PO TABS
2.5000 mg | ORAL_TABLET | Freq: Every day | ORAL | Status: DC
  Filled 2023-10-23: qty 0.5

## 2023-10-23 MED ORDER — ALBUTEROL SULFATE (2.5 MG/3ML) 0.083% IN NEBU: 2.5000 mg | INHALATION_SOLUTION | Freq: Four times a day (QID) | RESPIRATORY_TRACT | Status: DC | PRN

## 2023-10-23 NOTE — Progress Notes (Signed)
 [facility] CLINIC CARDIOLOGY PROGRESS NOTE       Patient ID: Monica Stewart MRN: [id number] DOB/AGE: [birth year] 78 y.o.  Admit date: 10/15/2023 Referring Physician Dr. Tobie Primary Physician Rudolpho Norleen BIRCH, MD Primary Cardiologist Dr. Bosie (2015) Reason for Consultation Newly reduced EF  HPI: Monica Stewart is a 78 y.o. female  with a past medical history of hypertension, hyperlipidemia, chronic respiratory failure (3L), COPD, pulmonary nodule and patient reports hx of atrial flutter (years ago) who presented to the ED on 10/15/2023 for worsening fatigue, lower extremity swelling, fatigue for months.  Patient endorses orthopnea.  EKG on 07/21 and telemetry new onset atrial flutter relation.  Echo during this admission revealed a reduced EF 35-45%. Cardiology was consulted for further evaluation.   Interval History: -Patient seen and examined this AM resting comfortably in hospital bed.  -Denies SOB, no LE edema. Remains on 2L with stable SpO2. Wean as able. -BP low this AM, she is without dizziness/lightheadedness.  -Recommend patient continue to work with PT.    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   CARDIAC CATHETERIZATION     CATARACT EXTRACTION W/PHACO Right 01/23/2015   Procedure: CATARACT EXTRACTION PHACO AND INTRAOCULAR LENS PLACEMENT (IOC);  Surgeon: Newell Ovens, MD;  Location: [facility] ORS;  Service: Ophthalmology;  Laterality: Right;  US             1.09 AP             17.7 CDE         12.16 casette lot # [id number]   EYE SURGERY     TUBAL LIGATION      Medications Prior to Admission   Medication Sig Dispense Refill Last Dose/Taking   albuterol  (VENTOLIN  HFA) 108 (90 Base) MCG/ACT inhaler Inhale 1-2 puffs into the lungs every 6 (six) hours as needed for wheezing or shortness of breath.   10/16/2023   alendronate (FOSAMAX) 70 MG tablet Take 70 mg by mouth once a week.   10/15/2023 Evening   amLODipine (NORVASC) 2.5 MG tablet Take 2.5 mg by mouth daily.   10/15/2023 Evening   Azelastine HCl 137 MCG/SPRAY SOLN Place 1 spray into both nostrils 2 (two) times daily.   10/15/2023 Evening   calcium  citrate-vitamin D  500-400 MG-UNIT chewable tablet Chew 1 tablet by mouth daily.    10/15/2023 Evening   Ferrous Fumarate (HEMOCYTE - 106 MG FE) 324 (106 Fe) MG TABS tablet Take 1 tablet by mouth every other day.   Unknown   fluticasone  (FLONASE ) 50 MCG/ACT nasal spray Place 2 sprays into both nostrils daily.   10/15/2023 Evening   furosemide  (LASIX ) 20 MG tablet Take 20 mg by mouth daily.   10/16/2023 Morning   Iron-Vitamins (GERITOL PO) Take 1 tablet by mouth daily.   Unknown   loratadine  (CLARITIN ) 10 MG tablet Take 10 mg by mouth daily.   10/15/2023 Evening   losartan  (COZAAR ) 100 MG tablet Take 100 mg by mouth daily.   10/15/2023 Evening   lovastatin (MEVACOR) 40 MG tablet Take 40 mg  by mouth at bedtime.   10/15/2023 Evening   meloxicam (MOBIC) 7.5 MG tablet Take 7.5 mg by mouth daily.   10/16/2023 Morning   Multiple Vitamins-Minerals (MULTIVITAMIN WITH MINERALS) tablet Take 1 tablet by mouth daily.   10/16/2023 Morning   NON FORMULARY Take 1 capsule by mouth daily.   Unknown   predniSONE  (DELTASONE ) 5 MG tablet Take 5 mg by mouth daily.   10/16/2023 Morning   Roflumilast 250 MCG TABS Take 250 mcg by mouth daily.   10/15/2023 Evening   TRELEGY ELLIPTA 100-62.5-25 MCG/INH AEPB Inhale 1 puff into the lungs daily.   10/16/2023 Morning   hydrochlorothiazide  (HYDRODIURIL ) 25 MG tablet Take 25 mg by mouth daily. (Patient not taking: Reported on 10/16/2023)   Not Taking   Ipratropium-Albuterol  (COMBIVENT )  20-100 MCG/ACT AERS respimat Inhale 1 puff into the lungs every 6 (six) hours for 7 days.  0    Social History   Socioeconomic History   Marital status: Divorced    Spouse name: Not on file   Number of children: Not on file   Years of education: Not on file   Highest education level: Not on file  Occupational History   Not on file  Tobacco Use   Smoking status: Former    Current packs/day: 0.00    Types: Cigarettes    Quit date: 01/22/1989    Years since quitting: 34.7   Smokeless tobacco: Never  Substance and Sexual Activity   Alcohol  use: No   Drug use: No   Sexual activity: Not on file  Other Topics Concern   Not on file  Social History Narrative   Not on file   Social Drivers of Health   Financial Resource Strain: Low Risk  (06/27/2023)   Received from [facility]   Overall Financial Resource Strain (CARDIA)    Difficulty of Paying Living Expenses: Not very hard  Food Insecurity: Food Insecurity Present (06/27/2023)   Received from [facility]   Hunger Vital Sign    Within the past 12 months, you worried that your food would run out before you got the money to buy more.: Never true    Within the past 12 months, the food you bought just didn't last and you didn't have money to get more.: Sometimes true  Transportation Needs: No Transportation Needs (06/27/2023)   Received from [facility] - Transportation    In the past 12 months, has lack of transportation kept you from medical appointments or from getting medications?: No    Lack of Transportation (Non-Medical): No  Physical Activity: Not on file  Stress: Not on file  Social Connections: Not on file  Intimate Partner Violence: Not on file    Family History  Problem Relation Age of Onset   Breast cancer Sister 12   Breast cancer Paternal Aunt      Vitals:   10/23/23 0600 10/23/23 0700 10/23/23 0747 10/23/23 0800  BP: 91/61   (!) 85/57  Pulse: 72  71  75  Resp: 20 17  20   Temp:      TempSrc:      SpO2: 99% 96% 100% 100%  Weight:      Height:        PHYSICAL EXAM General: Chronically ill appearing female, well nourished, in no acute distress. HEENT: Normocephalic and atraumatic. Neck: No JVD.   Lungs: Normal respiratory effort on 2L. Diminished breath sounds bilaterally Heart: HRR, elevated HR. Normal  S1 and S2 without gallops or murmurs.  Abdomen: Non-distended appearing.  Msk: Normal strength and tone for age. Extremities: Warm and well perfused. No clubbing, cyanosis, edema.  Neuro: Alert and oriented X 3. Psych: Answers questions appropriately.   Labs: Basic Metabolic Panel: Recent Labs    10/22/23 0525 10/23/23 0551  NA 143 137  K 4.9 4.6  CL 87* 86*  CO2 42* 42*  GLUCOSE 123* 129*  BUN 42* 49*  CREATININE 0.64 0.95  CALCIUM  10.3 10.4*   Liver Function Tests: Recent Labs    10/23/23 0551  AST 23  ALT 47*  ALKPHOS 61  BILITOT 0.5  PROT 5.8*  ALBUMIN 2.9*    No results for input(s): LIPASE, AMYLASE in the last 72 hours. CBC: Recent Labs    10/22/23 0525 10/23/23 0551  WBC 11.3*  --   HGB 13.6 12.7  HCT 43.6  --   MCV 93.6  --   PLT 237  --    Cardiac Enzymes: No results for input(s): CKTOTAL, CKMB, CKMBINDEX, TROPONINIHS in the last 72 hours.  BNP: No results for input(s): BNP in the last 72 hours.  D-Dimer: No results for input(s): DDIMER in the last 72 hours.  Hemoglobin A1C: No results for input(s): HGBA1C in the last 72 hours.  Fasting Lipid Panel: No results for input(s): CHOL, HDL, LDLCALC, TRIG, CHOLHDL, LDLDIRECT in the last 72 hours. Thyroid Function Tests: No results for input(s): TSH, T4TOTAL, T3FREE, THYROIDAB in the last 72 hours.  Invalid input(s): FREET3  Anemia Panel: No results for input(s): VITAMINB12, FOLATE, FERRITIN, TIBC, IRON, RETICCTPCT in the last 72 hours.   Radiology: US  RENAL Result Date:  10/22/2023 CLINICAL DATA:  Hematuria EXAM: RENAL / URINARY TRACT ULTRASOUND COMPLETE COMPARISON:  CT scan 12/20/2018 FINDINGS: Right Kidney: Renal measurements: 8.3 by 3.7 by 4.8 cm = volume: 77 mL. Echogenicity within normal limits. No mass or hydronephrosis visualized. Left Kidney: Renal measurements: 9.8 by 4.1 by 4.4 cm = volume: 91 mL. Echogenicity within normal limits. No mass or hydronephrosis visualized. Sub-centimeter small single cysts in the mid kidneys are clinically inconsequential and merit no further imaging follow up. Bladder: Appears normal for degree of bladder distention. Other: A 3.7 by 3.0 by 3.2 cm nonspecific solid hypoechoic lesion is present in the spleen. This nonspecific finding could be further characterized with MRI of the abdomen with and without contrast, if clinically warranted. IMPRESSION: 1. No specific cause for hematuria is identified on today's ultrasound. 2. Nonspecific 3.7 cm solid hypoechoic lesion in the spleen. This nonspecific finding could be further characterized with MRI of the abdomen with and without contrast, if clinically warranted. Electronically Signed   By: Ryan Salvage M.D.   On: 10/22/2023 19:00   ECHOCARDIOGRAM COMPLETE Result Date: 10/16/2023    ECHOCARDIOGRAM REPORT   Patient Name:   Monica Stewart Date of Exam: 10/16/2023 Medical Rec #:  [id number]       Height:       64.0 in Accession #:    [id number]      Weight:       112.2 lb Date of Birth:  1945-05-12       BSA:          1.530 m Patient Age:    77 years        BP:           137/88 mmHg Patient Gender: F  HR:           115 bpm. Exam Location:  [facility] Procedure: 2D Echo, 3D Echo, Cardiac Doppler, Color Doppler and Strain Analysis            (Both Spectral and Color Flow Doppler were utilized during            procedure). Indications:     Elevated Troponin  History:         Patient has prior history of Echocardiogram examinations, most                  recent 05/03/2023.  Sonographer:      Thedora Louder RDCS, FASE Referring Phys:  [id number] PRENTICE BROCKS CORE Diagnosing Phys: Cara JONETTA Lovelace MD  Sonographer Comments: Global longitudinal strain was attempted. IMPRESSIONS  1. Left ventricular ejection fraction, by estimation, is 35 to 40%. The left ventricle has moderately decreased function. The left ventricle demonstrates global hypokinesis. The left ventricular internal cavity size was mildly dilated. Left ventricular diastolic function could not be evaluated. The average left ventricular global longitudinal strain is 11.1 %. The global longitudinal strain is abnormal.  2. Right ventricular systolic function is low normal. The right ventricular size is mildly enlarged.  3. The mitral valve is normal in structure. Trivial mitral valve regurgitation.  4. The aortic valve is normal in structure. Aortic valve regurgitation is not visualized. Aortic valve sclerosis is present, with no evidence of aortic valve stenosis. FINDINGS  Left Ventricle: Left ventricular ejection fraction, by estimation, is 35 to 40%. The left ventricle has moderately decreased function. The left ventricle demonstrates global hypokinesis. The average left ventricular global longitudinal strain is 11.1 %.  Strain was performed and the global longitudinal strain is abnormal. The left ventricular internal cavity size was mildly dilated. There is no left ventricular hypertrophy. Left ventricular diastolic function could not be evaluated. Right Ventricle: The right ventricular size is mildly enlarged. No increase in right ventricular wall thickness. Right ventricular systolic function is low normal. Left Atrium: Left atrial size was normal in size. Right Atrium: Right atrial size was normal in size. Pericardium: There is no evidence of pericardial effusion. Mitral Valve: The mitral valve is normal in structure. Trivial mitral valve regurgitation. Tricuspid Valve: The tricuspid valve is normal in structure. Tricuspid valve regurgitation is  mild. Aortic Valve: The aortic valve is normal in structure. Aortic valve regurgitation is not visualized. Aortic valve sclerosis is present, with no evidence of aortic valve stenosis. Aortic valve peak gradient measures 5.8 mmHg. Pulmonic Valve: The pulmonic valve was normal in structure. Pulmonic valve regurgitation is not visualized. Aorta: The ascending aorta was not well visualized. IAS/Shunts: No atrial level shunt detected by color flow Doppler. Additional Comments: 3D was performed not requiring image post processing on an independent workstation and was abnormal.  LEFT VENTRICLE PLAX 2D LVIDd:         2.75 cm     Diastology LVIDs:         3.60 cm     LV e' medial:    19.00 cm/s LV PW:         1.00 cm     LV E/e' medial:  5.6 LV IVS:        3.15 cm     LV e' lateral:   13.80 cm/s LVOT diam:     1.80 cm     LV E/e' lateral: 7.7 LV SV:         45 LV SV  Index:   29          2D Longitudinal Strain LVOT Area:     2.54 cm    2D Strain GLS (A4C):   10.2 %                            2D Strain GLS (A3C):   11.4 %                            2D Strain GLS (A2C):   11.7 % LV Volumes (MOD)           2D Strain GLS Avg:     11.1 % LV vol d, MOD A2C: 68.8 ml LV vol d, MOD A4C: 81.0 ml LV vol s, MOD A2C: 42.5 ml LV vol s, MOD A4C: 47.9 ml 3D Volume EF: LV SV MOD A2C:     26.3 ml 3D EF:        40 % LV SV MOD A4C:     81.0 ml LV EDV:       102 ml LV SV MOD BP:      29.1 ml LV ESV:       61 ml                            LV SV:        41 ml RIGHT VENTRICLE RV Basal diam:  3.00 cm RV S prime:     14.90 cm/s TAPSE (M-mode): 2.0 cm LEFT ATRIUM             Index        RIGHT ATRIUM           Index LA diam:        3.20 cm 2.09 cm/m   RA Area:     10.30 cm LA Vol (A2C):   51.0 ml 33.33 ml/m  RA Volume:   26.00 ml  16.99 ml/m LA Vol (A4C):   35.8 ml 23.39 ml/m LA Biplane Vol: 44.5 ml 29.08 ml/m  AORTIC VALVE                 PULMONIC VALVE AV Area (Vmax): 2.23 cm     PV Vmax:        0.87 m/s AV Vmax:        120.00 cm/s  PV Peak  grad:   3.0 mmHg AV Peak Grad:   5.8 mmHg     RVOT Peak grad: 2 mmHg LVOT Vmax:      105.00 cm/s LVOT Vmean:     65.800 cm/s LVOT VTI:       0.176 m  AORTA Ao Root diam: 3.40 cm MITRAL VALVE MV Area (PHT): 6.37 cm     SHUNTS MV Decel Time: 119 msec     Systemic VTI:  0.18 m MV E velocity: 106.00 cm/s  Systemic Diam: 1.80 cm Cara JONETTA Lovelace MD Electronically signed by Cara JONETTA Lovelace MD Signature Date/Time: 10/16/2023/9:49:51 AM    Final    DG Chest Portable 1 View Result Date: 10/15/2023 CLINICAL DATA:  COPD exacerbation EXAM: PORTABLE CHEST - 1 VIEW COMPARISON:  02/23/2020 FINDINGS: Pulmonary hyperinflation with attenuated bronchovascular markings in both upper lobes. No focal airspace disease. Mild interstitial prominence in the lung bases, increased from previous. Heart size and mediastinal contours are within normal limits. Aortic Atherosclerosis (  ICD10-170.0). Mild blunting of the lateral costophrenic angles. Visualized bones unremarkable. IMPRESSION: 1. Pulmonary hyperinflation with mild bibasilar interstitial prominence. 2. Possible small pleural effusions. Electronically Signed   By: JONETTA Faes M.D.   On: 10/15/2023 21:15    ECHO as above  TELEMETRY reviewed by me 10/23/2023: sinus rhythm rate 70s  EKG reviewed by me: atrial flutter, rate 158 bpm (new onset).  Data reviewed by me 10/23/2023: last 24h vitals tele labs imaging I/O hospitalist progress notes.  Principal Problem:   Acute on chronic respiratory failure with hypoxia and hypercapnia (HCC) Active Problems:   COPD with acute exacerbation (HCC)   DM (diabetes mellitus), type 2 (HCC)   Transaminitis   HTN (hypertension)   HLD (hyperlipidemia)   Osteoporosis   Palliative care encounter   Protein-calorie malnutrition, severe   Acute systolic (congestive) heart failure (HCC)   Paroxysmal atrial fibrillation (HCC)   Hypotension   Hematuria    ASSESSMENT AND PLAN:  Monica Stewart is a 78 y.o. female  with a past medical  history of hypertension, hyperlipidemia, chronic respiratory failure (3L), COPD, pulmonary nodule and patient reports hx of atrial flutter (years ago) who presented to the ED on 10/15/2023 for worsening fatigue, lower extremity swelling, fatigue for months.  Patient endorses orthopnea.  EKG on 07/21 and telemetry new onset atrial flutter relation.  Echo during this admission revealed a reduced EF 35-45%. Cardiology was consulted for further evaluation.  # New onset HFrEF # Acute on chronic respiratory failure # COPD exacerbation BNP elevated at 1100.  Chest x-ray with pulmonary vascular congestion.  This admission reveals newly reduced EF of 35-40% with global hypokinesis. -Resume home lasix  20 mg daily. -Discontinue Entresto  due to hypotension. Will reassess BP outpatient to evaluate tolerability of ARB vs ACE (had cough in past with lisinopril-hydrochlorothiazide ).  -Continue dapagliflozin  10 mg daily. (Copay $0) -Discontinubisoprolol  due to hypotension.  -Continue spironolactone  12.5 mg daily. -Pulmonology following and managing COPD/Lung disease.   # New onset Atrial fibrillation/flutter RVR # Paroxsymal atrial fibrillation EKG (07/21) with atrial flutter, rate 158 bpm (new onset). Per tele episode atrial fibrillation RVR. Per tele now remains in sinus tachycardia rate 100s. Per tele a few episodes of AF/AFL RVR last night and early this morning (07/23 & 07/24) -Continue Eliquis  5 mg twice daily for stroke risk reduction.  CHA2DS2-VASc score is at least 6. Patient denies any recent falls, or prior major bleeding.  -BP limiting rate control options. -Rhythm control medications limited due to severe underlying lung disease. -Continue Amio 400 mg BID for 10 days, then 200 mg daily for rhythm control. Not a long term option due to patients severe COPD. Consulted pulmonology and states no evidence of pulmonary fibrosis and gave okay to start Amio. -Cardiac monitor placed to assess AF/AFL, PVC  burden.   # Hypertension # Hyperlipidemia Troponins minimally elevated and flat 33 > 48. EKG without acute ischemic changes. BP stable. -See plan for meds as above.  This patient's plan of care was discussed and created with Dr. Florencio and he is in agreement.  Signed: Danita Bloch, PA-C  10/23/2023, 8:22 AM [facility]

## 2023-10-23 NOTE — Progress Notes (Signed)
 Progress Note   Patient: Monica Stewart FMW:[id number] DOB: 09/07/1945 DOA: 10/15/2023     7 DOS: the patient was seen and examined on 10/23/2023   Brief hospital course:  78 year old female with a past medical history of chronic respiratory failure on 3 L home oxygen , chronic obstructive pulmonary disorder FEV1 of 0.6 L, pulmonary nodule, gastroesophageal reflux disease who presents to the emergency department with increasing shortness of breath and dependent edema the patient is talking fluently on BiPAP reporting shortness of breath worsening since yesterday she reports increased sputum production and slightly increased cough denies any fever or chills she also reports approximately 2 months of increasing dependent edema she does endorse orthopnea. Patient does not smoke currently however was a heavy smoker many years ago. She follows with Dr. Theotis of pulmonary as outpatient   7/26.  Blood pressure in the 70s after Entresto  dose.  Case discussed with cardiology and they will hold Entresto  at this point and continue to monitor blood pressure.  7/27 Bisoprolol  discontinued by cardiology due to relative hypotension    Assessment and Plan:    Acute on chronic respiratory failure with hypoxia and hypercapnia (HCC) Chronically on 3 L.  Required BiPAP during the hospital course.  Currently on 2 L this morning.  Initially required IV steroids but now on oral steroids   Acute systolic (congestive) heart failure (HCC) EF of 35 to 40%.  Hold Entresto  and bisoprolol  due to relative hypotension.   Currently on spironolactone , oral Lasix , Farxiga  and Zebeta      COPD with acute exacerbation (HCC) Improved Currently on oral prednisone .  Required IV Solu-Medrol  earlier in the hospital course Continue as needed and scheduled bronchodilator therapy.   DM (diabetes mellitus), type 2 (HCC) Continue sliding scale insulin .  Hemoglobin A1c 6.7.  Improve glycemic control   Hematuria No further  episodes of hematuria Hemoglobin is stable    Paroxysmal atrial fibrillation (HCC) Patient on amiodarone , Zebeta  and Eliquis .   Protein-calorie malnutrition, severe Continue supplements   Osteoporosis Continue bisphosphonate as outpatient     HLD (hyperlipidemia) Holding statin with elevated liver function test     Transaminitis Holding statin.  Continue to monitor liver function tests.  Likely from heart failure.     Severe malnutrition Severe Malnutrition related to chronic illness as evidenced by severe fat depletion, severe muscle depletion. INTERVENTION:    Boost Plus chocolate po BID- Each supplement provides 360kcal and 14g protein.     Magic cup TID with meals, each supplement provides 290 kcal and 9 grams of protein   MVI po daily    Pt at high refeed risk; recommend monitor potassium, magnesium  and phosphorus labs daily until stable   Daily weights       Subjective: No new complaints.  Daughter at the bedside  Physical Exam: Vitals:   10/23/23 0600 10/23/23 0700 10/23/23 0747 10/23/23 0800  BP: 91/61   (!) 85/57  Pulse: 72 71  75  Resp: 20 17  20   Temp:      TempSrc:      SpO2: 99% 96% 100% 100%  Weight:      Height:       HENT:     Head: Normocephalic.     Mouth/Throat:     Pharynx: No oropharyngeal exudate.  Eyes:     General: Lids are normal.     Conjunctiva/sclera: Conjunctivae normal.  Cardiovascular:     Rate and Rhythm: Normal rate and regular rhythm.     Heart  sounds: Normal heart sounds, S1 normal and S2 normal.  Pulmonary:     Breath sounds: Examination of the right-lower field reveals decreased breath sounds. Examination of the left-lower field reveals decreased breath sounds. Decreased breath sounds present. No wheezing, rhonchi or rales.  Abdominal:     Palpations: Abdomen is soft.     Tenderness: There is no abdominal tenderness.  Musculoskeletal:     Right lower leg: No swelling.     Left lower leg: No swelling.  Skin:     General: Skin is warm.     Findings: No rash.  Neurological:     Mental Status: She is alert.       Data Reviewed: Labs reviewed.  Calcium  10.4, bicarb 42, Labs reviewed  Family Communication: Plan of care was discussed with patient and her daughter at the bedside.  Results of renal ultrasound was also discussed with them as well.  Patient is in agreement to go to skilled nursing facility for subacute rehab  Disposition: Status is: Inpatient Remains inpatient appropriate because: Awaiting discharge to SNF  Planned Discharge Destination: Skilled nursing facility    Time spent: 38 minutes  Author: Aimee Somerset, MD 10/23/2023 2:07 PM  For on call review [email or web address].

## 2023-10-23 NOTE — Plan of Care (Signed)
 Problem: Education: Goal: Ability to describe self-care measures that may prevent or decrease complications (Diabetes Survival Skills Education) will improve 10/23/2023 0321 by Ebb Laymon BIRCH, RN Outcome: Progressing 10/23/2023 0321 by Ebb Laymon BIRCH, RN Outcome: Progressing Goal: Individualized Educational Video(s) 10/23/2023 0321 by Ebb Laymon BIRCH, RN Outcome: Progressing 10/23/2023 0321 by Ebb Laymon BIRCH, RN Outcome: Progressing   Problem: Coping: Goal: Ability to adjust to condition or change in health will improve 10/23/2023 0321 by Ebb Laymon BIRCH, RN Outcome: Progressing 10/23/2023 0321 by Ebb Laymon BIRCH, RN Outcome: Progressing   Problem: Fluid Volume: Goal: Ability to maintain a balanced intake and output will improve 10/23/2023 0321 by Ebb Laymon BIRCH, RN Outcome: Progressing 10/23/2023 0321 by Ebb Laymon BIRCH, RN Outcome: Progressing   Problem: Health Behavior/Discharge Planning: Goal: Ability to identify and utilize available resources and services will improve 10/23/2023 0321 by Ebb Laymon BIRCH, RN Outcome: Progressing 10/23/2023 0321 by Ebb Laymon BIRCH, RN Outcome: Progressing Goal: Ability to manage health-related needs will improve 10/23/2023 0321 by Ebb Laymon BIRCH, RN Outcome: Progressing 10/23/2023 0321 by Ebb Laymon BIRCH, RN Outcome: Progressing   Problem: Metabolic: Goal: Ability to maintain appropriate glucose levels will improve 10/23/2023 0321 by Ebb Laymon BIRCH, RN Outcome: Progressing 10/23/2023 0321 by Ebb Laymon BIRCH, RN Outcome: Progressing   Problem: Nutritional: Goal: Maintenance of adequate nutrition will improve 10/23/2023 0321 by Ebb Laymon BIRCH, RN Outcome: Progressing 10/23/2023 0321 by Ebb Laymon BIRCH, RN Outcome: Progressing Goal: Progress toward achieving an optimal weight will improve 10/23/2023 0321 by Ebb Laymon BIRCH, RN Outcome: Progressing 10/23/2023 0321 by Ebb Laymon BIRCH, RN Outcome: Progressing   Problem:  Skin Integrity: Goal: Risk for impaired skin integrity will decrease 10/23/2023 0321 by Ebb Laymon BIRCH, RN Outcome: Progressing 10/23/2023 0321 by Ebb Laymon BIRCH, RN Outcome: Progressing   Problem: Tissue Perfusion: Goal: Adequacy of tissue perfusion will improve 10/23/2023 0321 by Ebb Laymon BIRCH, RN Outcome: Progressing 10/23/2023 0321 by Ebb Laymon BIRCH, RN Outcome: Progressing   Problem: Education: Goal: Knowledge of General Education information will improve Description: Including pain rating scale, medication(s)/side effects and non-pharmacologic comfort measures 10/23/2023 0321 by Ebb Laymon BIRCH, RN Outcome: Progressing 10/23/2023 0321 by Ebb Laymon BIRCH, RN Outcome: Progressing   Problem: Health Behavior/Discharge Planning: Goal: Ability to manage health-related needs will improve 10/23/2023 0321 by Ebb Laymon BIRCH, RN Outcome: Progressing 10/23/2023 0321 by Ebb Laymon BIRCH, RN Outcome: Progressing   Problem: Clinical Measurements: Goal: Ability to maintain clinical measurements within normal limits will improve 10/23/2023 0321 by Ebb Laymon BIRCH, RN Outcome: Progressing 10/23/2023 0321 by Ebb Laymon BIRCH, RN Outcome: Progressing Goal: Will remain free from infection 10/23/2023 0321 by Ebb Laymon BIRCH, RN Outcome: Progressing 10/23/2023 0321 by Ebb Laymon BIRCH, RN Outcome: Progressing Goal: Diagnostic test results will improve 10/23/2023 0321 by Ebb Laymon BIRCH, RN Outcome: Progressing 10/23/2023 0321 by Ebb Laymon BIRCH, RN Outcome: Progressing Goal: Respiratory complications will improve 10/23/2023 0321 by Ebb Laymon BIRCH, RN Outcome: Progressing 10/23/2023 0321 by Ebb Laymon BIRCH, RN Outcome: Progressing Goal: Cardiovascular complication will be avoided 10/23/2023 0321 by Ebb Laymon BIRCH, RN Outcome: Progressing 10/23/2023 0321 by Ebb Laymon BIRCH, RN Outcome: Progressing   Problem: Activity: Goal: Risk for activity intolerance will  decrease 10/23/2023 0321 by Ebb Laymon BIRCH, RN Outcome: Progressing 10/23/2023 0321 by Ebb Laymon BIRCH, RN Outcome: Progressing   Problem: Nutrition: Goal: Adequate nutrition will be maintained 10/23/2023 0321 by Ebb Laymon BIRCH, RN Outcome: Progressing 10/23/2023 0321 by Ebb Laymon BIRCH, RN Outcome: Progressing  Problem: Coping: Goal: Level of anxiety will decrease 10/23/2023 0321 by Ebb Laymon BIRCH, RN Outcome: Progressing 10/23/2023 0321 by Ebb Laymon BIRCH, RN Outcome: Progressing   Problem: Elimination: Goal: Will not experience complications related to bowel motility 10/23/2023 0321 by Ebb Laymon BIRCH, RN Outcome: Progressing 10/23/2023 0321 by Ebb Laymon BIRCH, RN Outcome: Progressing Goal: Will not experience complications related to urinary retention 10/23/2023 0321 by Ebb Laymon BIRCH, RN Outcome: Progressing 10/23/2023 0321 by Ebb Laymon BIRCH, RN Outcome: Progressing   Problem: Pain Managment: Goal: General experience of comfort will improve and/or be controlled 10/23/2023 0321 by Ebb Laymon BIRCH, RN Outcome: Progressing 10/23/2023 0321 by Ebb Laymon BIRCH, RN Outcome: Progressing   Problem: Safety: Goal: Ability to remain free from injury will improve 10/23/2023 0321 by Ebb Laymon BIRCH, RN Outcome: Progressing 10/23/2023 0321 by Ebb Laymon BIRCH, RN Outcome: Progressing   Problem: Skin Integrity: Goal: Risk for impaired skin integrity will decrease 10/23/2023 0321 by Ebb Laymon BIRCH, RN Outcome: Progressing 10/23/2023 0321 by Ebb Laymon BIRCH, RN Outcome: Progressing   Problem: Education: Goal: Knowledge of disease or condition will improve 10/23/2023 0321 by Ebb Laymon BIRCH, RN Outcome: Progressing 10/23/2023 0321 by Ebb Laymon BIRCH, RN Outcome: Progressing Goal: Knowledge of the prescribed therapeutic regimen will improve 10/23/2023 0321 by Ebb Laymon BIRCH, RN Outcome: Progressing 10/23/2023 0321 by Ebb Laymon BIRCH, RN Outcome:  Progressing Goal: Individualized Educational Video(s) 10/23/2023 0321 by Ebb Laymon BIRCH, RN Outcome: Progressing 10/23/2023 0321 by Ebb Laymon BIRCH, RN Outcome: Progressing   Problem: Activity: Goal: Ability to tolerate increased activity will improve 10/23/2023 0321 by Ebb Laymon BIRCH, RN Outcome: Progressing 10/23/2023 0321 by Ebb Laymon BIRCH, RN Outcome: Progressing Goal: Will verbalize the importance of balancing activity with adequate rest periods 10/23/2023 0321 by Ebb Laymon BIRCH, RN Outcome: Progressing 10/23/2023 0321 by Ebb Laymon BIRCH, RN Outcome: Progressing   Problem: Respiratory: Goal: Ability to maintain a clear airway will improve 10/23/2023 0321 by Ebb Laymon BIRCH, RN Outcome: Progressing 10/23/2023 0321 by Ebb Laymon BIRCH, RN Outcome: Progressing Goal: Levels of oxygenation will improve 10/23/2023 0321 by Ebb Laymon BIRCH, RN Outcome: Progressing 10/23/2023 0321 by Ebb Laymon BIRCH, RN Outcome: Progressing Goal: Ability to maintain adequate ventilation will improve 10/23/2023 0321 by Ebb Laymon BIRCH, RN Outcome: Progressing 10/23/2023 0321 by Ebb Laymon BIRCH, RN Outcome: Progressing

## 2023-10-24 DIAGNOSIS — J9622 Acute and chronic respiratory failure with hypercapnia: Secondary | ICD-10-CM | POA: Diagnosis not present

## 2023-10-24 DIAGNOSIS — J9621 Acute and chronic respiratory failure with hypoxia: Secondary | ICD-10-CM | POA: Diagnosis not present

## 2023-10-24 LAB — BASIC METABOLIC PANEL WITH GFR
Anion gap: 11 (ref 5–15)
BUN: 48 mg/dL — ABNORMAL HIGH (ref 8–23)
CO2: 42 mmol/L — ABNORMAL HIGH (ref 22–32)
Calcium: 10 mg/dL (ref 8.9–10.3)
Chloride: 88 mmol/L — ABNORMAL LOW (ref 98–111)
Creatinine, Ser: 1.12 mg/dL — ABNORMAL HIGH (ref 0.44–1.00)
GFR, Estimated: 51 mL/min — ABNORMAL LOW (ref >60)
Glucose, Bld: 105 mg/dL — ABNORMAL HIGH (ref 70–99)
Potassium: 5.1 mmol/L (ref 3.5–5.1)
Sodium: 141 mmol/L (ref 135–145)

## 2023-10-24 LAB — GLUCOSE, CAPILLARY
Glucose-Capillary: 106 mg/dL — ABNORMAL HIGH (ref 70–99)
Glucose-Capillary: 118 mg/dL — ABNORMAL HIGH (ref 70–99)
Glucose-Capillary: 135 mg/dL — ABNORMAL HIGH (ref 70–99)
Glucose-Capillary: 260 mg/dL — ABNORMAL HIGH (ref 70–99)

## 2023-10-24 MED ORDER — FUROSEMIDE 20 MG PO TABS
20.0000 mg | ORAL_TABLET | ORAL | Status: DC
  Administered 2023-10-25: 20 mg via ORAL
  Filled 2023-10-24: qty 1

## 2023-10-24 NOTE — Progress Notes (Signed)
 PULMONOLOGY         Date: 10/24/2023,   MRN# [id number] Monica Stewart 1945/08/08     AdmissionWeight: 50.9 kg                 CurrentWeight: 52.9 kg  Referring provider: Dr Core   CHIEF COMPLAINT:    Acute on chronic hypoxemic and hypercapnic respiratory failure  HISTORY OF PRESENT ILLNESS   This is a 78 yo F with stage 4 COPD and chronic hypoxemia on 3L/min [place] and recurrent hypercapnic encephalopathy.  Daughter reports courses of confusion at home even at rest. She is weak with deconditioning with baseline mMRC at 4.  She also has advanced Systolic CHF with EF <35% And a list of additional comorbidities. She came in to ER with worsening respiratory distress.  She has been compliant with combination ICS/LABA/LAMA at home with Trelegy once dialy. She also takes chronic prednisone  5mg  and Daliresp . She required BIPAP on arrival and is in respiratory distress. ABG with severe hypercapnic hypoxemic resp failure.  PCCM consultation for further evaluation and management.   10/24/23- patient is close to baseline now. She is speaking in full sentences during my evaluation today.  I was able to reach her daughter via phone and review medical plan. Specifically we talked about post discharge care, patient has already received nebulizer, IS, and has her oxygen  supplies from prior.  She has not yet received her NIV.  Daughter also asking regarding motorized medical bed for patient.  She does have Monica Stewart with case management working on this case to help with resources. Overall patient is close to baseline however is very deconditioned physically and is planning to start rehab post discharge.   PAST MEDICAL HISTORY   Past Medical History:  Diagnosis Date  . Anginal pain (HCC)   . Asthma   . GERD (gastroesophageal reflux disease)   . H/O wheezing   . History of orthopnea   . Hypertension   . Shortness of breath dyspnea      SURGICAL HISTORY   Past Surgical History:   Procedure Laterality Date  . 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 CARDIAC CATHETERIZATION    . CATARACT EXTRACTION W/PHACO Right 01/23/2015   Procedure: CATARACT EXTRACTION PHACO AND INTRAOCULAR LENS PLACEMENT (IOC);  Surgeon: Newell Ovens, MD;  Location: [facility] ORS;  Service: Ophthalmology;  Laterality: Right;  US             1.09 AP             17.7 CDE         12.16 casette lot # [id number]  . EYE SURGERY    . TUBAL LIGATION       FAMILY HISTORY   Family History  Problem Relation Age of Onset  . Breast cancer Sister 44  . Breast cancer Paternal Aunt      SOCIAL HISTORY   Social History   Tobacco Use  . Smoking status: Former    Current packs/day: 0.00    Types: Cigarettes    Quit date: 01/22/1989    Years since quitting: 34.7  . Smokeless tobacco: Never  Substance Use Topics  . Alcohol  use: No  . Drug use: No     MEDICATIONS    Home Medication:    Current Medication:  Current Facility-Administered Medications:  .  acetaminophen  (TYLENOL ) tablet 650 mg, 650 mg, Oral, Q6H PRN **OR** acetaminophen  (TYLENOL ) suppository 650  mg, 650 mg, Rectal, Q6H PRN, Core, Prentice BROCKS, MD .  ALPRAZolam  (XANAX ) tablet 0.25 mg, 0.25 mg, Oral, TID PRN, Patel, Sona, MD, 0.25 mg at 10/20/23 1737 .  amiodarone  (PACERONE ) tablet 400 mg, 400 mg, Oral, BID, 400 mg at 10/23/23 2215 **FOLLOWED BY** [START ON 10/30/2023] amiodarone  (PACERONE ) tablet 200 mg, 200 mg, Oral, Daily, Decoste, Gabriella, PA-C .  apixaban  (ELIQUIS ) tablet 5 mg, 5 mg, Oral, BID, Decoste, Gabriella, PA-C, 5 mg at 10/23/23 2216 .  calcium  carbonate (TUMS - dosed in mg elemental calcium ) chewable tablet 200 mg of elemental calcium , 1 tablet, Oral, TID PRN, Josette, Richard, MD, 200 mg of elemental calcium  at 10/22/23 1148 .  calcium -vitamin D  (OSCAL WITH D) 500-5 MG-MCG per tablet 1 tablet, 1 tablet, Oral, Daily, Core, Prentice BROCKS, MD, 1 tablet at 10/23/23 [phone] .   dapagliflozin  propanediol (FARXIGA ) tablet 10 mg, 10 mg, Oral, Daily, Decoste, Gabriella, PA-C, 10 mg at 10/23/23 9062 .  famotidine  (PEPCID ) tablet 20 mg, 20 mg, Oral, Daily, Wieting, Richard, MD, 20 mg at 10/23/23 0936 .  fluticasone  (FLONASE ) 50 MCG/ACT nasal spray 2 spray, 2 spray, Each Nare, Daily, Core, Prentice BROCKS, MD, 2 spray at 10/23/23 [phone] .  furosemide  (LASIX ) tablet 20 mg, 20 mg, Oral, Daily, Decoste, Gabriella, PA-C, 20 mg at 10/23/23 9062 .  insulin  aspart (novoLOG ) injection 0-5 Units, 0-5 Units, Subcutaneous, QHS, Patel, Sona, MD .  insulin  aspart (novoLOG ) injection 0-9 Units, 0-9 Units, Subcutaneous, TID WC, Patel, Sona, MD, 3 Units at 10/23/23 1754 .  lactose free nutrition (BOOST PLUS) liquid 237 mL, 237 mL, Oral, BID BM, Patel, Sona, MD, 237 mL at 10/23/23 0939 .  levalbuterol  (XOPENEX ) nebulizer solution 0.63 mg, 0.63 mg, Nebulization, Q8H PRN, Agbata, Tochukwu, MD .  multivitamin with minerals tablet 1 tablet, 1 tablet, Oral, Daily, Core, Prentice BROCKS, MD, 1 tablet at 10/23/23 9062 .  ondansetron  (ZOFRAN ) tablet 4 mg, 4 mg, Oral, Q6H PRN **OR** ondansetron  (ZOFRAN ) injection 4 mg, 4 mg, Intravenous, Q6H PRN, Core, Prentice BROCKS, MD .  polyethylene glycol (MIRALAX  / GLYCOLAX ) packet 17 g, 17 g, Oral, Daily PRN, Core, Prentice BROCKS, MD .  predniSONE  (DELTASONE ) tablet 30 mg, 30 mg, Oral, Q breakfast, [name], MD, 30 mg at 10/23/23 0935 .  spironolactone  (ALDACTONE ) tablet 12.5 mg, 12.5 mg, Oral, Daily, Decoste, Gabriella, PA-C, 12.5 mg at 10/23/23 0937 .  umeclidinium-vilanterol (ANORO ELLIPTA ) 62.5-25 MCG/ACT 1 puff, 1 puff, Inhalation, Daily, Core, Prentice BROCKS, MD, 1 puff at 10/23/23 0939    ALLERGIES   Lisinopril-hydrochlorothiazide      REVIEW OF SYSTEMS    Review of Systems:  Gen:  Denies  fever, sweats, chills weigh loss  HEENT: Denies blurred vision, double vision, ear pain, eye pain, hearing loss, nose bleeds, sore throat Cardiac:  No dizziness, chest pain or  heaviness, chest tightness,edema Resp:   reports dyspnea chronically  Gi: Denies swallowing difficulty, stomach pain, nausea or vomiting, diarrhea, constipation, bowel incontinence Gu:  Denies bladder incontinence, burning urine Ext:   Denies Joint pain, stiffness or swelling Skin: Denies  skin rash, easy bruising or bleeding or hives Endoc:  Denies polyuria, polydipsia , polyphagia or weight change Psych:   Denies depression, insomnia or hallucinations   Other:  All other systems negative   VS: BP 106/67 (BP Location: Right Arm)   Pulse 72   Temp 98.1 F (36.7 C) (Oral)   Resp (!) 23   Ht 5' 4 (1.626 m)   Wt 52.9 kg   SpO2  97%   BMI 20.02 kg/m      PHYSICAL EXAM    GENERAL:NAD, no fevers, chills, no weakness no fatigue HEAD: Normocephalic, atraumatic.  EYES: Pupils equal, round, reactive to light. Extraocular muscles intact. No scleral icterus.  MOUTH: Moist mucosal membrane. Dentition intact. No abscess noted.  EAR, NOSE, THROAT: Clear without exudates. No external lesions.  NECK: Supple. No thyromegaly. No nodules. No JVD.  PULMONARY: decreased breath sounds with mild rhonchi worse at bases bilaterally.  CARDIOVASCULAR: S1 and S2. Regular rate and rhythm. No murmurs, rubs, or gallops. No edema. Pedal pulses 2+ bilaterally.  GASTROINTESTINAL: Soft, nontender, nondistended. No masses. Positive bowel sounds. No hepatosplenomegaly.  MUSCULOSKELETAL: No swelling, clubbing, or edema. Range of motion full in all extremities.  NEUROLOGIC: Cranial nerves II through XII are intact. No gross focal neurological deficits. Sensation intact. Reflexes intact.  SKIN: No ulceration, lesions, rashes, or cyanosis. Skin warm and dry. Turgor intact.  PSYCHIATRIC: Mood, affect within normal limits. The patient is awake, alert and oriented x 3. Insight, judgment intact.       IMAGING   CLINICAL DATA: COPD exacerbation  EXAM: PORTABLE CHEST - 1 VIEW  COMPARISON:  02/23/2020  FINDINGS: Pulmonary hyperinflation with attenuated bronchovascular markings in both upper lobes. No focal airspace disease. Mild interstitial prominence in the lung bases, increased from previous.  Heart size and mediastinal contours are within normal limits. Aortic Atherosclerosis (ICD10-170.0).  Mild blunting of the lateral costophrenic angles.  Visualized bones unremarkable.  IMPRESSION: 1. Pulmonary hyperinflation with mild bibasilar interstitial prominence. 2. Possible small pleural effusions.   Electronically Signed By: JONETTA Faes M.D. On: 10/15/2023 21:15   ASSESSMENT/PLAN   Acute on chronic hypoxemic hypercapnic respiratory failure-RESOLVED     -due to Severe Acute COPD exacerbation    -possible culprit is viral vs bacterial infection     - CRP trend    - procalcitonin trend    - respiratory culture    - resp viral panel    - COVID/FLU/RSV negative    - continue COPD care path with steroids, nebs, abx    - IS as able , flutter as able     - repeat CXR in 48hr    -legionella and strep pneumoniae testing    -BODE score >8 with <20% long term survival    - palliative care consultation for possible comfort care / hospice evaluation    -pred reduced to 35mg    Hypercapnic respiratory failure with Encephalopathy    - continue BIPAP at bedtime at home via NIV     Bibasilar atelelctasis      Chest physiotherapy      - continue BIPAP      - PT/OT     Advanced Systolic CHF      - continue diuresis prn      - trans thoracic echo with both left and right heart dysfunction - ddimer to rule out PE      - LVEF <35%      Thank you for allowing me to participate in the care of this patient.   Patient/Family are satisfied with care plan and all questions have been answered.    Provider disclosure: Patient with at least one acute or chronic illness or injury that poses a threat to life or bodily function and is being managed actively during this  encounter.  All of the below services have been performed independently by signing provider:  review of prior documentation from internal and or external health records.  Review of previous and current lab results.  Interview and comprehensive assessment during patient visit today. Review of current and previous chest radiographs/CT scans. Discussion of management and test interpretation with health care team and patient/family.   This document was prepared using Dragon voice recognition software and may include unintentional dictation errors.     Mitul Hallowell, M.D.  Division of Pulmonary & Critical Care Medicine

## 2023-10-24 NOTE — Progress Notes (Signed)
 Occupational Therapy Treatment Patient Details Name: Monica Stewart MRN: [id number] DOB: 1946-03-26 Today's Date: 10/24/2023   History of present illness Monica Stewart is a 78 y.o. female  with a past medical history of hypertension, hyperlipidemia, chronic respiratory failure (3L), COPD, pulmonary nodule and patient reports hx of atrial flutter (years ago) who presented to the ED on 10/15/2023 for worsening fatigue, lower extremity swelling, fatigue for months. Admitted for mgmt of COPD and acute on chronic respiratory failure   OT comments  Pt seen for OT tx. Pt pleasant, agreeable, and endorses just being a bit tired. Pt completed bed mobility with supv and provided with set up for seated grooming task. Pt stood with SBA-CGA + RW to stand and take a few steps in the room to sit in the recliner. Pt notes recently having been up prior to this session. OT facilitated problem solving and instruction for ECS to support improved safety and ADL and IADL participation, including bathing, housekeeping, and meal prep. Pt verbalized understanding. Also educated in the RPE scale to support safety with ADL/IADL. Will continue to progress towards goals.       If plan is discharge home, recommend the following:  A lot of help with walking and/or transfers;A lot of help with bathing/dressing/bathroom;Assistance with cooking/housework;Direct supervision/assist for medications management;Direct supervision/assist for financial management;Help with stairs or ramp for entrance;Assist for transportation   Equipment Recommendations  BSC/3in1       Precautions / Restrictions Precautions Precautions: Fall Recall of Precautions/Restrictions: Intact Restrictions Weight Bearing Restrictions Per Provider Order: No       Mobility Bed Mobility Overal bed mobility: Needs Assistance Bed Mobility: Supine to Sit     Supine to sit: HOB elevated, Used rails, Supervision          Transfers Overall transfer  level: Needs assistance Equipment used: Rolling walker (2 wheels) Transfers: Sit to/from Stand Sit to Stand: Supervision      Balance Overall balance assessment: Needs assistance Sitting-balance support: Feet supported, Bilateral upper extremity supported, Single extremity supported Sitting balance-Leahy Scale: Good     Standing balance support: Bilateral upper extremity supported, During functional activity, Single extremity supported, Reliant on assistive device for balance Standing balance-Leahy Scale: Fair       ADL either performed or assessed with clinical judgement   ADL Overall ADL's : Needs assistance/impaired     Grooming: Wash/dry face;Wash/dry hands;Sitting;Set up;Supervision/safety      Communication Communication Communication: No apparent difficulties   Cognition Arousal: Alert Behavior During Therapy: WFL for tasks assessed/performed Cognition: No apparent impairments      Following commands: Intact        Cueing   Cueing Techniques: Verbal cues  Exercises Other Exercises Other Exercises: OT facilitated problem solving and instruction for ECS to support improved safety and ADL participation.            Pertinent Vitals/ Pain       Pain Assessment Pain Assessment: No/denies pain   Frequency  Min 2X/week        Progress Toward Goals  OT Goals(current goals can now be found in the care plan section)  Progress towards OT goals: Progressing toward goals  Acute Rehab OT Goals OT Goal Formulation: With patient Time For Goal Achievement: 11/01/23 Potential to Achieve Goals: Good   AM-PAC OT 6 Clicks Daily Activity     Outcome Measure   Help from another person eating meals?: None Help from another person taking care of personal grooming?: A Little Help  from another person toileting, which includes using toliet, bedpan, or urinal?: A Little Help from another person bathing (including washing, rinsing, drying)?: A Lot Help from  another person to put on and taking off regular upper body clothing?: A Little Help from another person to put on and taking off regular lower body clothing?: A Lot 6 Click Score: 17    End of Session Equipment Utilized During Treatment: Rolling walker (2 wheels);Oxygen ;Gait belt  OT Visit Diagnosis: Unsteadiness on feet (R26.81);Other abnormalities of gait and mobility (R26.89);Muscle weakness (generalized) (M62.81)   Activity Tolerance Patient tolerated treatment well   Patient Left in chair;with call bell/phone within reach;with chair alarm set   Time: 8497-8463 OT Time Calculation (min): 34 min  Charges: OT General Charges $OT Visit: 1 Visit OT Treatments $Self Care/Home Management : 23-37 mins    Warren SAUNDERS., MPH, MS, OTR/L ascom [phone] 10/24/23, 4:20 PM

## 2023-10-24 NOTE — Plan of Care (Signed)

## 2023-10-24 NOTE — Plan of Care (Signed)

## 2023-10-24 NOTE — Progress Notes (Signed)
 Daily Progress Note   Patient Name: Monica Stewart       Date: 10/24/2023 DOB: 07/23/1945  Age: 78 y.o. MRN#: [id number] Attending Physician: Lanetta Lingo, MD Primary Care Physician: Rudolpho Norleen BIRCH, MD Admit Date: 10/15/2023  Reason for Consultation/Follow-up: Establishing goals of care  Subjective: PMT has been shadowing.  Patient remains admitted.  Into see patient.  She is currently sitting up in bed with her grandson at bedside.  She denies complaint and smiles and jokes with me.  She is on 3 L of oxygen  and heart rate is in the 70s, last cycled blood pressure is well within normal limits.  She states she is thankful that God has answered her prayer and given her more time.  She states she has discussed home with home health PT and OT, as well as going to rehab.  She states she believes that she wants to go home with home health therapies.  Length of Stay: 8  Current Medications: Scheduled Meds:  . amiodarone   400 mg Oral BID   Followed by  . [START ON 10/30/2023] amiodarone   200 mg Oral Daily  . apixaban   5 mg Oral BID  . calcium -vitamin D   1 tablet Oral Daily  . dapagliflozin  propanediol  10 mg Oral Daily  . famotidine   20 mg Oral Daily  . fluticasone   2 spray Each Nare Daily  . [START ON 10/25/2023] furosemide   20 mg Oral QODAY  . insulin  aspart  0-5 Units Subcutaneous QHS  . insulin  aspart  0-9 Units Subcutaneous TID WC  . lactose free nutrition  237 mL Oral BID BM  . multivitamin with minerals  1 tablet Oral Daily  . predniSONE   30 mg Oral Q breakfast  . spironolactone   12.5 mg Oral Daily  . umeclidinium-vilanterol  1 puff Inhalation Daily    Continuous Infusions:   PRN Meds: acetaminophen  **OR** acetaminophen , ALPRAZolam , calcium  carbonate, levalbuterol ,  ondansetron  **OR** ondansetron  (ZOFRAN ) IV, polyethylene glycol  Physical Exam Pulmonary:     Effort: Pulmonary effort is normal.  Skin:    General: Skin is warm and dry.  Neurological:     Mental Status: She is alert.             Vital Signs: BP 106/67 (BP Location: Right Arm)   Pulse 72   Temp  98.1 F (36.7 C) (Oral)   Resp (!) 23   Ht 5' 4 (1.626 m)   Wt 52.9 kg   SpO2 97%   BMI 20.02 kg/m  SpO2: SpO2: 97 % O2 Device: O2 Device: Nasal Cannula O2 Flow Rate: O2 Flow Rate (L/min): 2 L/min  Intake/output summary:  Intake/Output Summary (Last 24 hours) at 10/24/2023 1431 Last data filed at 10/24/2023 0900 Gross per 24 hour  Intake 240 ml  Output 650 ml  Net -410 ml   LBM: Last BM Date : 10/23/23 Baseline Weight: Weight: 50.9 kg Most recent weight: Weight: 52.9 kg   Patient Active Problem List   Diagnosis Date Noted  . Acute on chronic respiratory failure with hypoxia and hypercapnia (HCC) 10/22/2023  . Acute systolic (congestive) heart failure (HCC) 10/22/2023  . Paroxysmal atrial fibrillation (HCC) 10/22/2023  . Hypotension 10/22/2023  . Hematuria 10/22/2023  . Protein-calorie malnutrition, severe 10/20/2023  . DM (diabetes mellitus), type 2 (HCC) 10/16/2023  . Transaminitis 10/16/2023  . HTN (hypertension) 10/16/2023  . HLD (hyperlipidemia) 10/16/2023  . Osteoporosis 10/16/2023  . Palliative care encounter 10/16/2023  . COPD with acute exacerbation (HCC) 02/23/2020    Palliative Care Assessment & Plan    Recommendations/Plan: Patient considering home with home health PT and OT.  Code Status:    Code Status Orders  (From admission, onward)           Start     Ordered   10/16/23 2027  Full code  (Code Status)  Continuous       Question:  By:  Answer:  Consent: discussion documented in EHR   10/16/23 2026           Code Status History     Date Active Date Inactive Code Status Order ID Comments User Context   10/16/2023 0000 10/16/2023  2026 Limited: Do not attempt resuscitation (DNR) -DNR-LIMITED -Do Not Intubate/DNI  [id number]  Core, Prentice BROCKS, MD ED   02/23/2020 1410 02/25/2020 1956 Full Code [id number]  Acheampong, Maude POUR, MD ED       Prognosis: Poor long-term    Thank you for allowing the Palliative Medicine Team to assist in the care of this patient.   Camelia Lewis, NP  Please contact Palliative Medicine Team phone at [phone] for questions and concerns.

## 2023-10-24 NOTE — Progress Notes (Addendum)
 [facility] CARDIOLOGY PROGRESS NOTE       Patient ID: Monica Stewart MRN: [id number] DOB/AGE: [birth year] 78 y.o.  Admit date: 10/15/2023 Referring Physician Dr. Tobie Primary Physician Monica Norleen BIRCH, MD Primary Cardiologist Dr. Bosie (2015) Reason for Consultation Newly reduced EF  HPI: Monica Stewart is a 78 y.o. female  with a past medical history of hypertension, hyperlipidemia, chronic respiratory failure (3L), COPD, pulmonary nodule and patient reports hx of atrial flutter (years ago) who presented to the ED on 10/15/2023 for worsening fatigue, lower extremity swelling, fatigue for months.  Patient endorses orthopnea.  EKG on 07/21 and telemetry new onset atrial flutter relation.  Echo during this admission revealed a reduced EF 35-45%. Cardiology was consulted for further evaluation.   Interval History: -Patient seen and examined this AM resting comfortably in bedside chair. -Denies SOB, no LE edema. Remains on 2L with stable SpO2. Wean as able. -BP improving, remains borderline this AM, she is without dizziness/lightheadedness.  -Recommend patient continue to work with PT.  -Patient appears near euvolemia.    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   CARDIAC CATHETERIZATION     CATARACT EXTRACTION W/PHACO Right 01/23/2015   Procedure: CATARACT EXTRACTION PHACO AND INTRAOCULAR LENS PLACEMENT (IOC);  Surgeon: Newell Ovens, MD;  Location: [facility] ORS;  Service: Ophthalmology;  Laterality: Right;  US             1.09 AP             17.7 CDE         12.16 casette lot # [id number]   EYE SURGERY      TUBAL LIGATION      Medications Prior to Admission  Medication Sig Dispense Refill Last Dose/Taking   albuterol  (VENTOLIN  HFA) 108 (90 Base) MCG/ACT inhaler Inhale 1-2 puffs into the lungs every 6 (six) hours as needed for wheezing or shortness of breath.   10/16/2023   alendronate (FOSAMAX) 70 MG tablet Take 70 mg by mouth once a week.   10/15/2023 Evening   amLODipine (NORVASC) 2.5 MG tablet Take 2.5 mg by mouth daily.   10/15/2023 Evening   Azelastine HCl 137 MCG/SPRAY SOLN Place 1 spray into both nostrils 2 (two) times daily.   10/15/2023 Evening   calcium  citrate-vitamin D  500-400 MG-UNIT chewable tablet Chew 1 tablet by mouth daily.    10/15/2023 Evening   Ferrous Fumarate (HEMOCYTE - 106 MG FE) 324 (106 Fe) MG TABS tablet Take 1 tablet by mouth every other day.   Unknown   fluticasone  (FLONASE ) 50 MCG/ACT nasal spray Place 2 sprays into both nostrils daily.   10/15/2023 Evening   furosemide  (LASIX ) 20 MG tablet Take 20 mg by mouth daily.   10/16/2023 Morning   Iron-Vitamins (GERITOL PO) Take 1 tablet by mouth daily.   Unknown   loratadine  (CLARITIN ) 10 MG tablet Take 10 mg by mouth daily.   10/15/2023 Evening   losartan  (COZAAR ) 100 MG tablet Take 100 mg by mouth daily.   10/15/2023 Evening   lovastatin (MEVACOR)  40 MG tablet Take 40 mg by mouth at bedtime.   10/15/2023 Evening   meloxicam (MOBIC) 7.5 MG tablet Take 7.5 mg by mouth daily.   10/16/2023 Morning   Multiple Vitamins-Minerals (MULTIVITAMIN WITH MINERALS) tablet Take 1 tablet by mouth daily.   10/16/2023 Morning   NON FORMULARY Take 1 capsule by mouth daily.   Unknown   predniSONE  (DELTASONE ) 5 MG tablet Take 5 mg by mouth daily.   10/16/2023 Morning   Roflumilast 250 MCG TABS Take 250 mcg by mouth daily.   10/15/2023 Evening   TRELEGY ELLIPTA 100-62.5-25 MCG/INH AEPB Inhale 1 puff into the lungs daily.   10/16/2023 Morning   hydrochlorothiazide  (HYDRODIURIL ) 25 MG tablet Take 25 mg by mouth daily. (Patient not taking: Reported on  10/16/2023)   Not Taking   Ipratropium-Albuterol  (COMBIVENT ) 20-100 MCG/ACT AERS respimat Inhale 1 puff into the lungs every 6 (six) hours for 7 days.  0    Social History   Socioeconomic History   Marital status: Divorced    Spouse name: Not on file   Number of children: Not on file   Years of education: Not on file   Highest education level: Not on file  Occupational History   Not on file  Tobacco Use   Smoking status: Former    Current packs/day: 0.00    Types: Cigarettes    Quit date: 01/22/1989    Years since quitting: 34.7   Smokeless tobacco: Never  Substance and Sexual Activity   Alcohol  use: No   Drug use: No   Sexual activity: Not on file  Other Topics Concern   Not on file  Social History Narrative   Not on file   Social Drivers of Health   Financial Resource Strain: Low Risk  (06/27/2023)   Received from [facility]   Overall Financial Resource Strain (CARDIA)    Difficulty of Paying Living Expenses: Not very hard  Food Insecurity: Food Insecurity Present (06/27/2023)   Received from [facility]   Hunger Vital Sign    Within the past 12 months, you worried that your food would run out before you got the money to buy more.: Never true    Within the past 12 months, the food you bought just didn't last and you didn't have money to get more.: Sometimes true  Transportation Needs: No Transportation Needs (06/27/2023)   Received from [facility] - Transportation    In the past 12 months, has lack of transportation kept you from medical appointments or from getting medications?: No    Lack of Transportation (Non-Medical): No  Physical Activity: Not on file  Stress: Not on file  Social Connections: Not on file  Intimate Partner Violence: Not on file    Family History  Problem Relation Age of Onset   Breast cancer Sister 71   Breast cancer Paternal Aunt      Vitals:   10/23/23 1412 10/23/23 1600  10/23/23 2035 10/24/23 0732  BP: 104/67 103/64  106/67  Pulse: (!) 50 72    Resp: 18 20 (!) 23   Temp:  98.1 F (36.7 C)  98.1 F (36.7 C)  TempSrc:  Oral  Oral  SpO2:  97%    Weight:      Height:        PHYSICAL EXAM General: Chronically ill appearing female, well nourished, in no acute distress. HEENT: Normocephalic and atraumatic. Neck: No JVD.   Lungs: Normal  respiratory effort on 2L. Diminished breath sounds bilaterally Heart: HRR, elevated HR. Normal S1 and S2 without gallops or murmurs.  Abdomen: Non-distended appearing.  Msk: Normal strength and tone for age. Extremities: Warm and well perfused. No clubbing, cyanosis, edema.  Neuro: Alert and oriented X 3. Psych: Answers questions appropriately.   Labs: Basic Metabolic Panel: Recent Labs    10/23/23 0551 10/24/23 0755  NA 137 141  K 4.6 5.1  CL 86* 88*  CO2 42* 42*  GLUCOSE 129* 105*  BUN 49* 48*  CREATININE 0.95 1.12*  CALCIUM  10.4* 10.0   Liver Function Tests: Recent Labs    10/23/23 0551  AST 23  ALT 47*  ALKPHOS 61  BILITOT 0.5  PROT 5.8*  ALBUMIN 2.9*    No results for input(s): LIPASE, AMYLASE in the last 72 hours. CBC: Recent Labs    10/22/23 0525 10/23/23 0551  WBC 11.3*  --   HGB 13.6 12.7  HCT 43.6  --   MCV 93.6  --   PLT 237  --    Cardiac Enzymes: No results for input(s): CKTOTAL, CKMB, CKMBINDEX, TROPONINIHS in the last 72 hours.  BNP: No results for input(s): BNP in the last 72 hours.  D-Dimer: No results for input(s): DDIMER in the last 72 hours.  Hemoglobin A1C: No results for input(s): HGBA1C in the last 72 hours.  Fasting Lipid Panel: No results for input(s): CHOL, HDL, LDLCALC, TRIG, CHOLHDL, LDLDIRECT in the last 72 hours. Thyroid Function Tests: No results for input(s): TSH, T4TOTAL, T3FREE, THYROIDAB in the last 72 hours.  Invalid input(s): FREET3  Anemia Panel: No results for input(s): VITAMINB12, FOLATE,  FERRITIN, TIBC, IRON, RETICCTPCT in the last 72 hours.   Radiology: US  RENAL Result Date: 10/22/2023 CLINICAL DATA:  Hematuria EXAM: RENAL / URINARY TRACT ULTRASOUND COMPLETE COMPARISON:  CT scan 12/20/2018 FINDINGS: Right Kidney: Renal measurements: 8.3 by 3.7 by 4.8 cm = volume: 77 mL. Echogenicity within normal limits. No mass or hydronephrosis visualized. Left Kidney: Renal measurements: 9.8 by 4.1 by 4.4 cm = volume: 91 mL. Echogenicity within normal limits. No mass or hydronephrosis visualized. Sub-centimeter small single cysts in the mid kidneys are clinically inconsequential and merit no further imaging follow up. Bladder: Appears normal for degree of bladder distention. Other: A 3.7 by 3.0 by 3.2 cm nonspecific solid hypoechoic lesion is present in the spleen. This nonspecific finding could be further characterized with MRI of the abdomen with and without contrast, if clinically warranted. IMPRESSION: 1. No specific cause for hematuria is identified on today's ultrasound. 2. Nonspecific 3.7 cm solid hypoechoic lesion in the spleen. This nonspecific finding could be further characterized with MRI of the abdomen with and without contrast, if clinically warranted. Electronically Signed   By: Ryan Salvage M.D.   On: 10/22/2023 19:00   ECHOCARDIOGRAM COMPLETE Result Date: 10/16/2023    ECHOCARDIOGRAM REPORT   Patient Name:   Monica Stewart Date of Exam: 10/16/2023 Medical Rec #:  [id number]       Height:       64.0 in Accession #:    [id number]      Weight:       112.2 lb Date of Birth:  [birth year]       BSA:          1.530 m Patient Age:    77 years        BP:           137/88 mmHg Patient Gender: F  HR:           115 bpm. Exam Location:  [facility] Procedure: 2D Echo, 3D Echo, Cardiac Doppler, Color Doppler and Strain Analysis            (Both Spectral and Color Flow Doppler were utilized during            procedure). Indications:     Elevated Troponin  History:         Patient has  prior history of Echocardiogram examinations, most                  recent 05/03/2023.  Sonographer:     Thedora Louder RDCS, FASE Referring Phys:  [id number] PRENTICE BROCKS CORE Diagnosing Phys: Cara JONETTA Lovelace MD  Sonographer Comments: Global longitudinal strain was attempted. IMPRESSIONS  1. Left ventricular ejection fraction, by estimation, is 35 to 40%. The left ventricle has moderately decreased function. The left ventricle demonstrates global hypokinesis. The left ventricular internal cavity size was mildly dilated. Left ventricular diastolic function could not be evaluated. The average left ventricular global longitudinal strain is 11.1 %. The global longitudinal strain is abnormal.  2. Right ventricular systolic function is low normal. The right ventricular size is mildly enlarged.  3. The mitral valve is normal in structure. Trivial mitral valve regurgitation.  4. The aortic valve is normal in structure. Aortic valve regurgitation is not visualized. Aortic valve sclerosis is present, with no evidence of aortic valve stenosis. FINDINGS  Left Ventricle: Left ventricular ejection fraction, by estimation, is 35 to 40%. The left ventricle has moderately decreased function. The left ventricle demonstrates global hypokinesis. The average left ventricular global longitudinal strain is 11.1 %.  Strain was performed and the global longitudinal strain is abnormal. The left ventricular internal cavity size was mildly dilated. There is no left ventricular hypertrophy. Left ventricular diastolic function could not be evaluated. Right Ventricle: The right ventricular size is mildly enlarged. No increase in right ventricular wall thickness. Right ventricular systolic function is low normal. Left Atrium: Left atrial size was normal in size. Right Atrium: Right atrial size was normal in size. Pericardium: There is no evidence of pericardial effusion. Mitral Valve: The mitral valve is normal in structure. Trivial mitral valve  regurgitation. Tricuspid Valve: The tricuspid valve is normal in structure. Tricuspid valve regurgitation is mild. Aortic Valve: The aortic valve is normal in structure. Aortic valve regurgitation is not visualized. Aortic valve sclerosis is present, with no evidence of aortic valve stenosis. Aortic valve peak gradient measures 5.8 mmHg. Pulmonic Valve: The pulmonic valve was normal in structure. Pulmonic valve regurgitation is not visualized. Aorta: The ascending aorta was not well visualized. IAS/Shunts: No atrial level shunt detected by color flow Doppler. Additional Comments: 3D was performed not requiring image post processing on an independent workstation and was abnormal.  LEFT VENTRICLE PLAX 2D LVIDd:         2.75 cm     Diastology LVIDs:         3.60 cm     LV e' medial:    19.00 cm/s LV PW:         1.00 cm     LV E/e' medial:  5.6 LV IVS:        3.15 cm     LV e' lateral:   13.80 cm/s LVOT diam:     1.80 cm     LV E/e' lateral: 7.7 LV SV:         45 LV SV  Index:   29          2D Longitudinal Strain LVOT Area:     2.54 cm    2D Strain GLS (A4C):   10.2 %                            2D Strain GLS (A3C):   11.4 %                            2D Strain GLS (A2C):   11.7 % LV Volumes (MOD)           2D Strain GLS Avg:     11.1 % LV vol d, MOD A2C: 68.8 ml LV vol d, MOD A4C: 81.0 ml LV vol s, MOD A2C: 42.5 ml LV vol s, MOD A4C: 47.9 ml 3D Volume EF: LV SV MOD A2C:     26.3 ml 3D EF:        40 % LV SV MOD A4C:     81.0 ml LV EDV:       102 ml LV SV MOD BP:      29.1 ml LV ESV:       61 ml                            LV SV:        41 ml RIGHT VENTRICLE RV Basal diam:  3.00 cm RV S prime:     14.90 cm/s TAPSE (M-mode): 2.0 cm LEFT ATRIUM             Index        RIGHT ATRIUM           Index LA diam:        3.20 cm 2.09 cm/m   RA Area:     10.30 cm LA Vol (A2C):   51.0 ml 33.33 ml/m  RA Volume:   26.00 ml  16.99 ml/m LA Vol (A4C):   35.8 ml 23.39 ml/m LA Biplane Vol: 44.5 ml 29.08 ml/m  AORTIC VALVE                  PULMONIC VALVE AV Area (Vmax): 2.23 cm     PV Vmax:        0.87 m/s AV Vmax:        120.00 cm/s  PV Peak grad:   3.0 mmHg AV Peak Grad:   5.8 mmHg     RVOT Peak grad: 2 mmHg LVOT Vmax:      105.00 cm/s LVOT Vmean:     65.800 cm/s LVOT VTI:       0.176 m  AORTA Ao Root diam: 3.40 cm MITRAL VALVE MV Area (PHT): 6.37 cm     SHUNTS MV Decel Time: 119 msec     Systemic VTI:  0.18 m MV E velocity: 106.00 cm/s  Systemic Diam: 1.80 cm Cara JONETTA Lovelace MD Electronically signed by Cara JONETTA Lovelace MD Signature Date/Time: 10/16/2023/9:49:51 AM    Final    DG Chest Portable 1 View Result Date: 10/15/2023 CLINICAL DATA:  COPD exacerbation EXAM: PORTABLE CHEST - 1 VIEW COMPARISON:  02/23/2020 FINDINGS: Pulmonary hyperinflation with attenuated bronchovascular markings in both upper lobes. No focal airspace disease. Mild interstitial prominence in the lung bases, increased from previous. Heart size and mediastinal contours are within normal limits. Aortic Atherosclerosis (  ICD10-170.0). Mild blunting of the lateral costophrenic angles. Visualized bones unremarkable. IMPRESSION: 1. Pulmonary hyperinflation with mild bibasilar interstitial prominence. 2. Possible small pleural effusions. Electronically Signed   By: JONETTA Faes M.D.   On: 10/15/2023 21:15    ECHO as above  TELEMETRY reviewed by me 10/24/2023: sinus rhythm rate 60s  EKG reviewed by me: atrial flutter, rate 158 bpm (new onset).  Data reviewed by me 10/24/2023: last 24h vitals tele labs imaging I/O hospitalist progress notes.  Principal Problem:   Acute on chronic respiratory failure with hypoxia and hypercapnia (HCC) Active Problems:   COPD with acute exacerbation (HCC)   DM (diabetes mellitus), type 2 (HCC)   Transaminitis   HTN (hypertension)   HLD (hyperlipidemia)   Osteoporosis   Palliative care encounter   Protein-calorie malnutrition, severe   Acute systolic (congestive) heart failure (HCC)   Paroxysmal atrial fibrillation (HCC)    Hypotension   Hematuria    ASSESSMENT AND PLAN:  Monica Stewart is a 78 y.o. female  with a past medical history of hypertension, hyperlipidemia, chronic respiratory failure (3L), COPD, pulmonary nodule and patient reports hx of atrial flutter (years ago) who presented to the ED on 10/15/2023 for worsening fatigue, lower extremity swelling, fatigue for months.  Patient endorses orthopnea.  EKG on 07/21 and telemetry new onset atrial flutter relation.  Echo during this admission revealed a reduced EF 35-45%. Cardiology was consulted for further evaluation.  # New onset HFrEF # Acute on chronic respiratory failure # COPD exacerbation BNP elevated at 1100.  Chest x-ray with pulmonary vascular congestion.  This admission reveals newly reduced EF of 35-40% with global hypokinesis likely due to AF/AFL RVR. -Decreased home PO lasix  20 mg to every other day due to alkalosis. -Continue to hold Entresto  due to borderline BP. Will reassess BP outpatient to evaluate tolerability of ARB vs ACE (had cough in past with lisinopril-hydrochlorothiazide ).  -Continue dapagliflozin  10 mg daily. (Copay $0) -Continue to hold bisoprolol  due to borderline BP. -Continue spironolactone  12.5 mg daily. -Pulmonology following and managing COPD/Lung disease.   # New onset Atrial fibrillation/flutter RVR # Paroxsymal atrial fibrillation EKG (07/21) with atrial flutter, rate 158 bpm (new onset). Per tele episode atrial fibrillation RVR. Per tele now remains in sinus tachycardia rate 100s. Per tele a few episodes of AF/AFL RVR last night and early this morning (07/23 & 07/24) -Continue Eliquis  5 mg twice daily for stroke risk reduction.  CHA2DS2-VASc score is at least 6. Patient denies any recent falls, or prior major bleeding. Per tele remains in SR with controlled rate s/p amio.  -BP limiting rate control options. -Rhythm control medications limited due to severe underlying lung disease. -Continue Amio 400 mg BID for 10  days, then 200 mg daily for rhythm control. Not a long term option due to patients severe COPD. Consulted pulmonology and states no evidence of pulmonary fibrosis and gave okay to start Amio. -Cardiac monitor placed to assess AF/AFL, PVC burden.   # Hypertension # Hyperlipidemia Troponins minimally elevated and flat 33 > 48. EKG without acute ischemic changes. BP stable. -See plan for meds as above.  This patient's plan of care was discussed and created with Dr. Wilburn and he is in agreement.  Signed: Dorene Comfort, PA-C  10/24/2023, 9:03 AM [facility] Card[facility]

## 2023-10-24 NOTE — Progress Notes (Signed)
 Progress Note   Patient: Monica Stewart FMW:[id number] DOB: 1946-01-02 DOA: 10/15/2023     8 DOS: the patient was seen and examined on 10/24/2023   Brief hospital course:  78 year old female with a past medical history of chronic respiratory failure on 3 L home oxygen , chronic obstructive pulmonary disorder FEV1 of 0.6 L, pulmonary nodule, gastroesophageal reflux disease who presents to the emergency department with increasing shortness of breath and dependent edema the patient is talking fluently on BiPAP reporting shortness of breath worsening since yesterday she reports increased sputum production and slightly increased cough denies any fever or chills she also reports approximately 2 months of increasing dependent edema she does endorse orthopnea. Patient does not smoke currently however was a heavy smoker many years ago. She follows with Dr. Theotis of pulmonary as outpatient   7/26.  Blood pressure in the 70s after Entresto  dose.  Case discussed with cardiology and they will hold Entresto  at this point and continue to monitor blood pressure.   7/27 Bisoprolol  discontinued by cardiology due to relative hypotension  7/28 No new complaints   Assessment and Plan:  Acute on chronic respiratory failure with hypoxia and hypercapnia (HCC) Chronically on 3 L.  Required BiPAP during the hospital course but has been weaned off.  Currently on 2 L .  Initially required IV steroids but now on oral steroids   Acute systolic (congestive) heart failure (HCC) EF of 35 to 40%. Entresto  and bisoprolol  remain on hold due to relative hypotension.   Currently on spironolactone , oral Lasix  has been switched to every other day due to worsening renal function, Farxiga  and Zebeta      COPD with acute exacerbation (HCC) Improved Currently on oral prednisone .  Required IV Solu-Medrol  earlier in the hospital course Continue as needed and scheduled bronchodilator therapy.    DM (diabetes mellitus), type 2  (HCC) Continue sliding scale insulin .  Hemoglobin A1c 6.7.  Improve glycemic control    Hematuria Suspected UTI No further episodes of hematuria Hemoglobin is stable Urine culture yields 10,000 colonies of Pseudomonas and 20,000 colonies of E faecalis. No indication for antibiotic therapy at this time     Paroxysmal atrial fibrillation [facility]) Patient on amiodarone , Zebeta  and Eliquis .      Osteoporosis Continue bisphosphonate as outpatient     HLD (hyperlipidemia) Holding statin with elevated liver function test     Transaminitis Holding statin.  Continue to monitor liver function tests.  Likely from heart failure.     Severe malnutrition Severe Malnutrition related to chronic illness as evidenced by severe fat depletion, severe muscle depletion. INTERVENTION:    Boost Plus chocolate po BID- Each supplement provides 360kcal and 14g protein.     Magic cup TID with meals, each supplement provides 290 kcal and 9 grams of protein   MVI po daily    Pt at high refeed risk; recommend monitor potassium, magnesium  and phosphorus labs daily until stable   Daily weights          Subjective: No new complaints  Physical Exam: Vitals:   10/23/23 1412 10/23/23 1600 10/23/23 2035 10/24/23 0732  BP: 104/67 103/64  106/67  Pulse: (!) 50 72    Resp: 18 20 (!) 23   Temp:  98.1 F (36.7 C)  98.1 F (36.7 C)  TempSrc:  Oral  Oral  SpO2:  97%    Weight:      Height:       HENT:     Head: Normocephalic.  Mouth/Throat:     Pharynx: No oropharyngeal exudate.  Eyes:     General: Lids are normal.     Conjunctiva/sclera: Conjunctivae normal.  Cardiovascular:     Rate and Rhythm: Normal rate and regular rhythm.     Heart sounds: Normal heart sounds, S1 normal and S2 normal.  Pulmonary:     Breath sounds: Examination of the right-lower field reveals decreased breath sounds. Examination of the left-lower field reveals decreased breath sounds. Decreased breath sounds  present. No wheezing, rhonchi or rales.  Abdominal:     Palpations: Abdomen is soft.     Tenderness: There is no abdominal tenderness.  Musculoskeletal:     Right lower leg: No swelling.     Left lower leg: No swelling.  Skin:    General: Skin is warm.     Findings: No rash.  Neurological:     Mental Status: She is alert    Data Reviewed: Labs reviewed.  Potassium 5.1, bicarb 42, BUN 48, creatinine 1.12 Labs reviewed  Family Communication: Plan of care discussed with patient and family at the bedside.  They verbalized understanding and agree with the plan.  Disposition: Status is: Inpatient Remains inpatient appropriate because: Awaiting discharge to skilled nursing facility.  Planned Discharge Destination: Skilled nursing facility    Time spent: 40 minutes  Author: Aimee Somerset, MD 10/24/2023 1:33 PM  For on call review [email or web address].

## 2023-10-24 NOTE — Progress Notes (Signed)
 Physical Therapy Treatment Patient Details Name: Monica Stewart MRN: [id number] DOB: May 05, 1945 Today's Date: 10/24/2023   History of Present Illness Monica Stewart is a 78 y.o. female  with a past medical history of hypertension, hyperlipidemia, chronic respiratory failure (3L), COPD, pulmonary nodule and patient reports hx of atrial flutter (years ago) who presented to the ED on 10/15/2023 for worsening fatigue, lower extremity swelling, fatigue for months. Admitted for mgmt of COPD and acute on chronic respiratory failure    PT Comments  Patient alert, agreeable to PT with some motivation, PT arrival before breakfast. Denied pain. spO2 with unclear readings with mobility, on 3L throughout. Supervision for bed mobility, able to stand from [facility] and EOB with CGA, RW, verbal cues. Able to perform pericare in standing as well, CGA. She ambulated ~40ft in room in total, reported 7-8/10 RPE after activities. The patient would benefit from further skilled PT intervention to continue to progress towards goals. Recommendation remains appropriate.      If plan is discharge home, recommend the following: A lot of help with walking and/or transfers;A lot of help with bathing/dressing/bathroom;Assist for transportation;Help with stairs or ramp for entrance   Can travel by private vehicle     Yes  Equipment Recommendations  None recommended by PT    Recommendations for Other Services       Precautions / Restrictions Precautions Precautions: Fall Recall of Precautions/Restrictions: Intact Restrictions Weight Bearing Restrictions Per Provider Order: No     Mobility  Bed Mobility Overal bed mobility: Needs Assistance Bed Mobility: Supine to Sit     Supine to sit: HOB elevated, Used rails, Supervision          Transfers Overall transfer level: Needs assistance Equipment used: Rolling walker (2 wheels) Transfers: Sit to/from Stand Sit to Stand: Contact guard assist            General transfer comment: from EOB and from [facility], definite use of hands    Ambulation/Gait Ambulation/Gait assistance: Contact guard assist Gait Distance (Feet):  (~21ft total, 86ft to BSC, and 31ft in room after) Assistive device: Rolling walker (2 wheels)         General Gait Details: slow step through pattern, fatigued 7-8/10 RPE   Stairs             Wheelchair Mobility     Tilt Bed    Modified Rankin (Stroke Patients Only)       Balance Overall balance assessment: Needs assistance Sitting-balance support: Feet supported, Bilateral upper extremity supported Sitting balance-Leahy Scale: Good     Standing balance support: Bilateral upper extremity supported, During functional activity, Single extremity supported Standing balance-Leahy Scale: Fair Standing balance comment: able to perform pericare in standing                            Communication    Cognition Arousal: Alert Behavior During Therapy: WFL for tasks assessed/performed   PT - Cognitive impairments: No apparent impairments                         Following commands: Intact      Cueing Cueing Techniques: Verbal cues  Exercises      General Comments        Pertinent Vitals/Pain Pain Assessment Pain Assessment: No/denies pain    Home Living  Prior Function            PT Goals (current goals can now be found in the care plan section) Progress towards PT goals: Progressing toward goals    Frequency    Min 2X/week      PT Plan      Co-evaluation              AM-PAC PT 6 Clicks Mobility   Outcome Measure  Help needed turning from your back to your side while in a flat bed without using bedrails?: None Help needed moving from lying on your back to sitting on the side of a flat bed without using bedrails?: None Help needed moving to and from a bed to a chair (including a wheelchair)?: A Little Help needed  standing up from a chair using your arms (e.g., wheelchair or bedside chair)?: A Little Help needed to walk in hospital room?: A Little Help needed climbing 3-5 steps with a railing? : A Lot 6 Click Score: 19    End of Session Equipment Utilized During Treatment: Oxygen  Activity Tolerance: Patient tolerated treatment well Patient left: in chair;with call bell/phone within reach;with chair alarm set;with family/visitor present Nurse Communication: Mobility status PT Visit Diagnosis: Unsteadiness on feet (R26.81);Other abnormalities of gait and mobility (R26.89);Muscle weakness (generalized) (M62.81)     Time: 9084-9060 PT Time Calculation (min) (ACUTE ONLY): 24 min  Charges:    $Therapeutic Activity: 8-22 mins PT General Charges $$ ACUTE PT VISIT: 1 Visit                    Doyal Shams PT, DPT 10:56 AM,10/24/23

## 2023-10-25 ENCOUNTER — Other Ambulatory Visit: Payer: Self-pay

## 2023-10-25 DIAGNOSIS — I5020 Unspecified systolic (congestive) heart failure: Secondary | ICD-10-CM | POA: Diagnosis not present

## 2023-10-25 DIAGNOSIS — J439 Emphysema, unspecified: Secondary | ICD-10-CM | POA: Diagnosis not present

## 2023-10-25 DIAGNOSIS — R918 Other nonspecific abnormal finding of lung field: Secondary | ICD-10-CM | POA: Diagnosis not present

## 2023-10-25 DIAGNOSIS — I4892 Unspecified atrial flutter: Secondary | ICD-10-CM | POA: Diagnosis not present

## 2023-10-25 DIAGNOSIS — I10 Essential (primary) hypertension: Secondary | ICD-10-CM | POA: Diagnosis not present

## 2023-10-25 DIAGNOSIS — I5023 Acute on chronic systolic (congestive) heart failure: Secondary | ICD-10-CM | POA: Diagnosis not present

## 2023-10-25 DIAGNOSIS — R0602 Shortness of breath: Secondary | ICD-10-CM | POA: Diagnosis not present

## 2023-10-25 DIAGNOSIS — J301 Allergic rhinitis due to pollen: Secondary | ICD-10-CM | POA: Diagnosis not present

## 2023-10-25 DIAGNOSIS — F064 Anxiety disorder due to known physiological condition: Secondary | ICD-10-CM | POA: Diagnosis not present

## 2023-10-25 DIAGNOSIS — J9621 Acute and chronic respiratory failure with hypoxia: Secondary | ICD-10-CM | POA: Diagnosis not present

## 2023-10-25 DIAGNOSIS — I4891 Unspecified atrial fibrillation: Secondary | ICD-10-CM | POA: Diagnosis not present

## 2023-10-25 DIAGNOSIS — E119 Type 2 diabetes mellitus without complications: Secondary | ICD-10-CM | POA: Diagnosis not present

## 2023-10-25 DIAGNOSIS — N182 Chronic kidney disease, stage 2 (mild): Secondary | ICD-10-CM | POA: Diagnosis not present

## 2023-10-25 DIAGNOSIS — I493 Ventricular premature depolarization: Secondary | ICD-10-CM | POA: Diagnosis not present

## 2023-10-25 DIAGNOSIS — I480 Paroxysmal atrial fibrillation: Secondary | ICD-10-CM | POA: Diagnosis not present

## 2023-10-25 DIAGNOSIS — J9600 Acute respiratory failure, unspecified whether with hypoxia or hypercapnia: Secondary | ICD-10-CM | POA: Diagnosis not present

## 2023-10-25 DIAGNOSIS — J9622 Acute and chronic respiratory failure with hypercapnia: Secondary | ICD-10-CM | POA: Diagnosis not present

## 2023-10-25 DIAGNOSIS — G4733 Obstructive sleep apnea (adult) (pediatric): Secondary | ICD-10-CM | POA: Diagnosis not present

## 2023-10-25 DIAGNOSIS — E7800 Pure hypercholesterolemia, unspecified: Secondary | ICD-10-CM | POA: Diagnosis not present

## 2023-10-25 DIAGNOSIS — J441 Chronic obstructive pulmonary disease with (acute) exacerbation: Secondary | ICD-10-CM | POA: Diagnosis not present

## 2023-10-25 DIAGNOSIS — J449 Chronic obstructive pulmonary disease, unspecified: Secondary | ICD-10-CM | POA: Diagnosis not present

## 2023-10-25 DIAGNOSIS — E785 Hyperlipidemia, unspecified: Secondary | ICD-10-CM | POA: Diagnosis not present

## 2023-10-25 DIAGNOSIS — J440 Chronic obstructive pulmonary disease with acute lower respiratory infection: Secondary | ICD-10-CM | POA: Diagnosis not present

## 2023-10-25 LAB — BASIC METABOLIC PANEL WITH GFR
Anion gap: 11 (ref 5–15)
BUN: 45 mg/dL — ABNORMAL HIGH (ref 8–23)
CO2: 36 mmol/L — ABNORMAL HIGH (ref 22–32)
Calcium: 9.9 mg/dL (ref 8.9–10.3)
Chloride: 89 mmol/L — ABNORMAL LOW (ref 98–111)
Creatinine, Ser: 1.04 mg/dL — ABNORMAL HIGH (ref 0.44–1.00)
GFR, Estimated: 56 mL/min — ABNORMAL LOW (ref >60)
Glucose, Bld: 85 mg/dL (ref 70–99)
Potassium: 5.3 mmol/L — ABNORMAL HIGH (ref 3.5–5.1)
Sodium: 136 mmol/L (ref 135–145)

## 2023-10-25 LAB — URINE CULTURE
Culture: 10000 — AB
Special Requests: NORMAL

## 2023-10-25 LAB — GLUCOSE, CAPILLARY
Glucose-Capillary: 79 mg/dL (ref 70–99)
Glucose-Capillary: 111 mg/dL — ABNORMAL HIGH (ref 70–99)

## 2023-10-25 MED ORDER — INSULIN ASPART 100 UNIT/ML IJ SOLN
0.0000 [IU] | Freq: Three times a day (TID) | INTRAMUSCULAR | 11 refills | Status: AC
  Filled 2023-10-25: qty 10, 37d supply, fill #0

## 2023-10-25 MED ORDER — PREDNISONE 20 MG PO TABS
30.0000 mg | ORAL_TABLET | Freq: Every day | ORAL | Status: AC
  Administered 2023-10-25: 30 mg via ORAL
  Filled 2023-10-25: qty 1

## 2023-10-25 MED ORDER — PREDNISONE 5 MG PO TABS
ORAL_TABLET | ORAL | 0 refills | Status: AC
  Filled 2023-10-25: qty 36, 32d supply, fill #0

## 2023-10-25 MED ORDER — SPIRONOLACTONE 25 MG PO TABS
12.5000 mg | ORAL_TABLET | Freq: Every day | ORAL | 0 refills | Status: AC
  Filled 2023-10-25: qty 15, 30d supply, fill #0

## 2023-10-25 MED ORDER — GLUCERNA SHAKE PO LIQD
237.0000 mL | Freq: Three times a day (TID) | ORAL | 0 refills | Status: AC
  Filled 2023-10-25: qty 21330, 30d supply, fill #0

## 2023-10-25 MED ORDER — GLUCERNA SHAKE PO LIQD
237.0000 mL | Freq: Three times a day (TID) | ORAL | Status: DC
  Administered 2023-10-25: 237 mL via ORAL

## 2023-10-25 MED ORDER — APIXABAN 5 MG PO TABS
5.0000 mg | ORAL_TABLET | Freq: Two times a day (BID) | ORAL | 0 refills | Status: AC
  Filled 2023-10-25: qty 60, 30d supply, fill #0

## 2023-10-25 MED ORDER — POLYETHYLENE GLYCOL 3350 17 GM/SCOOP PO POWD
17.0000 g | Freq: Every day | ORAL | 0 refills | Status: AC | PRN
  Filled 2023-10-25: qty 238, 14d supply, fill #0

## 2023-10-25 MED ORDER — PREDNISONE 20 MG PO TABS: 20.0000 mg | ORAL_TABLET | Freq: Every day | ORAL | Status: DC

## 2023-10-25 MED ORDER — PREDNISONE 10 MG PO TABS: 5.0000 mg | ORAL_TABLET | Freq: Every day | ORAL | Status: DC

## 2023-10-25 MED ORDER — LEVALBUTEROL HCL 0.63 MG/3ML IN NEBU
0.6300 mg | INHALATION_SOLUTION | Freq: Three times a day (TID) | RESPIRATORY_TRACT | 12 refills | Status: AC | PRN
  Filled 2023-10-25: qty 75, 9d supply, fill #0

## 2023-10-25 MED ORDER — PREDNISONE 10 MG PO TABS: 10.0000 mg | ORAL_TABLET | Freq: Every day | ORAL | Status: DC

## 2023-10-25 MED ORDER — FUROSEMIDE 20 MG PO TABS
20.0000 mg | ORAL_TABLET | ORAL | 1 refills | Status: AC
  Filled 2023-10-25: qty 30, 60d supply, fill #0

## 2023-10-25 MED ORDER — DAPAGLIFLOZIN PROPANEDIOL 10 MG PO TABS
10.0000 mg | ORAL_TABLET | Freq: Every day | ORAL | 0 refills | Status: AC
  Filled 2023-10-25: qty 30, 30d supply, fill #0

## 2023-10-25 MED ORDER — AMIODARONE HCL 200 MG PO TABS
ORAL_TABLET | ORAL | 0 refills | Status: AC
  Filled 2023-10-25: qty 42, 33d supply, fill #0

## 2023-10-25 NOTE — Progress Notes (Signed)
 [facility] CLINIC CARDIOLOGY PROGRESS NOTE       Patient ID: Monica Stewart MRN: [id number] DOB/AGE: 1946/01/16 78 y.o.  Admit date: 10/15/2023 Referring Physician Dr. Tobie Primary Physician Rudolpho Norleen BIRCH, MD Primary Cardiologist Dr. Bosie (2015) Reason for Consultation Newly reduced EF  HPI: Monica Stewart is a 78 y.o. female  with a past medical history of hypertension, hyperlipidemia, chronic respiratory failure (3L), COPD, pulmonary nodule and patient reports hx of atrial flutter (years ago) who presented to the ED on 10/15/2023 for worsening fatigue, lower extremity swelling, fatigue for months.  Patient endorses orthopnea.  EKG on 07/21 and telemetry new onset atrial flutter relation.  Echo during this admission revealed a reduced EF 35-45%. Cardiology was consulted for further evaluation.   Interval History: -Patient seen and examined this AM resting comfortably in hospital bed. Patient states overall she feels good and eager to leave hospital. -Denies SOB, no LE edema. Remains on 2L with stable SpO2. Wean as able. -BP improving, remains borderline this AM, she is without dizziness/lightheadedness.  -Recommend patient continue to work with PT.  -Patient appears near euvolemia.    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   CARDIAC CATHETERIZATION     CATARACT EXTRACTION W/PHACO Right 01/23/2015   Procedure: CATARACT EXTRACTION PHACO AND INTRAOCULAR LENS PLACEMENT (IOC);  Surgeon: Newell Ovens, MD;  Location: [facility] ORS;  Service: Ophthalmology;  Laterality: Right;  US             1.09 AP              17.7 CDE         12.16 casette lot # [id number]   EYE SURGERY     TUBAL LIGATION      Medications Prior to Admission  Medication Sig Dispense Refill Last Dose/Taking   albuterol  (VENTOLIN  HFA) 108 (90 Base) MCG/ACT inhaler Inhale 1-2 puffs into the lungs every 6 (six) hours as needed for wheezing or shortness of breath.   10/16/2023   alendronate (FOSAMAX) 70 MG tablet Take 70 mg by mouth once a week.   10/15/2023 Evening   amLODipine (NORVASC) 2.5 MG tablet Take 2.5 mg by mouth daily.   10/15/2023 Evening   Azelastine HCl 137 MCG/SPRAY SOLN Place 1 spray into both nostrils 2 (two) times daily.   10/15/2023 Evening   calcium  citrate-vitamin D  500-400 MG-UNIT chewable tablet Chew 1 tablet by mouth daily.    10/15/2023 Evening   Ferrous Fumarate (HEMOCYTE - 106 MG FE) 324 (106 Fe) MG TABS tablet Take 1 tablet by mouth every other day.   Unknown   fluticasone  (FLONASE ) 50 MCG/ACT nasal spray Place 2 sprays into both nostrils daily.   10/15/2023 Evening   furosemide  (LASIX ) 20 MG tablet Take 20 mg by mouth daily.   10/16/2023 Morning   Iron-Vitamins (GERITOL PO) Take 1 tablet by mouth daily.   Unknown   loratadine  (CLARITIN ) 10 MG tablet Take 10 mg by mouth daily.   10/15/2023 Evening   losartan  (COZAAR ) 100 MG tablet Take 100 mg  by mouth daily.   10/15/2023 Evening   lovastatin (MEVACOR) 40 MG tablet Take 40 mg by mouth at bedtime.   10/15/2023 Evening   meloxicam (MOBIC) 7.5 MG tablet Take 7.5 mg by mouth daily.   10/16/2023 Morning   Multiple Vitamins-Minerals (MULTIVITAMIN WITH MINERALS) tablet Take 1 tablet by mouth daily.   10/16/2023 Morning   NON FORMULARY Take 1 capsule by mouth daily.   Unknown   predniSONE  (DELTASONE ) 5 MG tablet Take 5 mg by mouth daily.   10/16/2023 Morning   Roflumilast 250 MCG TABS Take 250 mcg by mouth daily.   10/15/2023 Evening   TRELEGY ELLIPTA 100-62.5-25 MCG/INH AEPB Inhale 1 puff into the lungs daily.   10/16/2023 Morning   hydrochlorothiazide  (HYDRODIURIL ) 25 MG tablet  Take 25 mg by mouth daily. (Patient not taking: Reported on 10/16/2023)   Not Taking   Ipratropium-Albuterol  (COMBIVENT ) 20-100 MCG/ACT AERS respimat Inhale 1 puff into the lungs every 6 (six) hours for 7 days.  0    Social History   Socioeconomic History   Marital status: Divorced    Spouse name: Not on file   Number of children: Not on file   Years of education: Not on file   Highest education level: Not on file  Occupational History   Not on file  Tobacco Use   Smoking status: Former    Current packs/day: 0.00    Types: Cigarettes    Quit date: 01/22/1989    Years since quitting: 34.7   Smokeless tobacco: Never  Substance and Sexual Activity   Alcohol  use: No   Drug use: No   Sexual activity: Not on file  Other Topics Concern   Not on file  Social History Narrative   Not on file   Social Drivers of Health   Financial Resource Strain: Low Risk  (06/27/2023)   Received from [facility]   Overall Financial Resource Strain (CARDIA)    Difficulty of Paying Living Expenses: Not very hard  Food Insecurity: Food Insecurity Present (06/27/2023)   Received from [facility]   Hunger Vital Sign    Within the past 12 months, you worried that your food would run out before you got the money to buy more.: Never true    Within the past 12 months, the food you bought just didn't last and you didn't have money to get more.: Sometimes true  Transportation Needs: No Transportation Needs (06/27/2023)   Received from [facility] - Transportation    In the past 12 months, has lack of transportation kept you from medical appointments or from getting medications?: No    Lack of Transportation (Non-Medical): No  Physical Activity: Not on file  Stress: Not on file  Social Connections: Not on file  Intimate Partner Violence: Not on file    Family History  Problem Relation Age of Onset   Breast cancer Sister 71   Breast cancer  Paternal Aunt      Vitals:   10/24/23 2200 10/24/23 2335 10/25/23 0510 10/25/23 0752  BP:  99/68 105/72   Pulse:  70    Resp:  17 17 18   Temp:  97.6 F (36.4 C) (!) 97.5 F (36.4 C) (!) 97.5 F (36.4 C)  TempSrc:  Oral    SpO2: 100% 97%    Weight:      Height:        PHYSICAL EXAM General: Chronically ill appearing female, well nourished, in  no acute distress. HEENT: Normocephalic and atraumatic. Neck: No JVD.   Lungs: Normal respiratory effort on 2L. Diminished breath sounds bilaterally Heart: HRR, elevated HR. Normal S1 and S2 without gallops or murmurs.  Abdomen: Non-distended appearing.  Msk: Normal strength and tone for age. Extremities: Warm and well perfused. No clubbing, cyanosis, edema.  Neuro: Alert and oriented X 3. Psych: Answers questions appropriately.   Labs: Basic Metabolic Panel: Recent Labs    10/24/23 0755 10/25/23 0807  NA 141 136  K 5.1 5.3*  CL 88* 89*  CO2 42* 36*  GLUCOSE 105* 85  BUN 48* 45*  CREATININE 1.12* 1.04*  CALCIUM  10.0 9.9   Liver Function Tests: Recent Labs    10/23/23 0551  AST 23  ALT 47*  ALKPHOS 61  BILITOT 0.5  PROT 5.8*  ALBUMIN 2.9*    No results for input(s): LIPASE, AMYLASE in the last 72 hours. CBC: Recent Labs    10/23/23 0551  HGB 12.7   Cardiac Enzymes: No results for input(s): CKTOTAL, CKMB, CKMBINDEX, TROPONINIHS in the last 72 hours.  BNP: No results for input(s): BNP in the last 72 hours.  D-Dimer: No results for input(s): DDIMER in the last 72 hours.  Hemoglobin A1C: No results for input(s): HGBA1C in the last 72 hours.  Fasting Lipid Panel: No results for input(s): CHOL, HDL, LDLCALC, TRIG, CHOLHDL, LDLDIRECT in the last 72 hours. Thyroid Function Tests: No results for input(s): TSH, T4TOTAL, T3FREE, THYROIDAB in the last 72 hours.  Invalid input(s): FREET3  Anemia Panel: No results for input(s): VITAMINB12, FOLATE, FERRITIN, TIBC,  IRON, RETICCTPCT in the last 72 hours.   Radiology: US  RENAL Result Date: 10/22/2023 CLINICAL DATA:  Hematuria EXAM: RENAL / URINARY TRACT ULTRASOUND COMPLETE COMPARISON:  CT scan 12/20/2018 FINDINGS: Right Kidney: Renal measurements: 8.3 by 3.7 by 4.8 cm = volume: 77 mL. Echogenicity within normal limits. No mass or hydronephrosis visualized. Left Kidney: Renal measurements: 9.8 by 4.1 by 4.4 cm = volume: 91 mL. Echogenicity within normal limits. No mass or hydronephrosis visualized. Sub-centimeter small single cysts in the mid kidneys are clinically inconsequential and merit no further imaging follow up. Bladder: Appears normal for degree of bladder distention. Other: A 3.7 by 3.0 by 3.2 cm nonspecific solid hypoechoic lesion is present in the spleen. This nonspecific finding could be further characterized with MRI of the abdomen with and without contrast, if clinically warranted. IMPRESSION: 1. No specific cause for hematuria is identified on today's ultrasound. 2. Nonspecific 3.7 cm solid hypoechoic lesion in the spleen. This nonspecific finding could be further characterized with MRI of the abdomen with and without contrast, if clinically warranted. Electronically Signed   By: Ryan Salvage M.D.   On: 10/22/2023 19:00   ECHOCARDIOGRAM COMPLETE Result Date: 10/16/2023    ECHOCARDIOGRAM REPORT   Patient Name:   BESSY REANEY Date of Exam: 10/16/2023 Medical Rec #:  [id number]       Height:       64.0 in Accession #:    [id number]      Weight:       112.2 lb Date of Birth:  09-24-1945       BSA:          1.530 m Patient Age:    77 years        BP:           137/88 mmHg Patient Gender: F  HR:           115 bpm. Exam Location:  [facility] Procedure: 2D Echo, 3D Echo, Cardiac Doppler, Color Doppler and Strain Analysis            (Both Spectral and Color Flow Doppler were utilized during            procedure). Indications:     Elevated Troponin  History:         Patient has prior history of  Echocardiogram examinations, most                  recent 05/03/2023.  Sonographer:     Thedora Louder RDCS, FASE Referring Phys:  [id number] PRENTICE BROCKS CORE Diagnosing Phys: Cara JONETTA Lovelace MD  Sonographer Comments: Global longitudinal strain was attempted. IMPRESSIONS  1. Left ventricular ejection fraction, by estimation, is 35 to 40%. The left ventricle has moderately decreased function. The left ventricle demonstrates global hypokinesis. The left ventricular internal cavity size was mildly dilated. Left ventricular diastolic function could not be evaluated. The average left ventricular global longitudinal strain is 11.1 %. The global longitudinal strain is abnormal.  2. Right ventricular systolic function is low normal. The right ventricular size is mildly enlarged.  3. The mitral valve is normal in structure. Trivial mitral valve regurgitation.  4. The aortic valve is normal in structure. Aortic valve regurgitation is not visualized. Aortic valve sclerosis is present, with no evidence of aortic valve stenosis. FINDINGS  Left Ventricle: Left ventricular ejection fraction, by estimation, is 35 to 40%. The left ventricle has moderately decreased function. The left ventricle demonstrates global hypokinesis. The average left ventricular global longitudinal strain is 11.1 %.  Strain was performed and the global longitudinal strain is abnormal. The left ventricular internal cavity size was mildly dilated. There is no left ventricular hypertrophy. Left ventricular diastolic function could not be evaluated. Right Ventricle: The right ventricular size is mildly enlarged. No increase in right ventricular wall thickness. Right ventricular systolic function is low normal. Left Atrium: Left atrial size was normal in size. Right Atrium: Right atrial size was normal in size. Pericardium: There is no evidence of pericardial effusion. Mitral Valve: The mitral valve is normal in structure. Trivial mitral valve regurgitation.  Tricuspid Valve: The tricuspid valve is normal in structure. Tricuspid valve regurgitation is mild. Aortic Valve: The aortic valve is normal in structure. Aortic valve regurgitation is not visualized. Aortic valve sclerosis is present, with no evidence of aortic valve stenosis. Aortic valve peak gradient measures 5.8 mmHg. Pulmonic Valve: The pulmonic valve was normal in structure. Pulmonic valve regurgitation is not visualized. Aorta: The ascending aorta was not well visualized. IAS/Shunts: No atrial level shunt detected by color flow Doppler. Additional Comments: 3D was performed not requiring image post processing on an independent workstation and was abnormal.  LEFT VENTRICLE PLAX 2D LVIDd:         2.75 cm     Diastology LVIDs:         3.60 cm     LV e' medial:    19.00 cm/s LV PW:         1.00 cm     LV E/e' medial:  5.6 LV IVS:        3.15 cm     LV e' lateral:   13.80 cm/s LVOT diam:     1.80 cm     LV E/e' lateral: 7.7 LV SV:         45 LV SV  Index:   29          2D Longitudinal Strain LVOT Area:     2.54 cm    2D Strain GLS (A4C):   10.2 %                            2D Strain GLS (A3C):   11.4 %                            2D Strain GLS (A2C):   11.7 % LV Volumes (MOD)           2D Strain GLS Avg:     11.1 % LV vol d, MOD A2C: 68.8 ml LV vol d, MOD A4C: 81.0 ml LV vol s, MOD A2C: 42.5 ml LV vol s, MOD A4C: 47.9 ml 3D Volume EF: LV SV MOD A2C:     26.3 ml 3D EF:        40 % LV SV MOD A4C:     81.0 ml LV EDV:       102 ml LV SV MOD BP:      29.1 ml LV ESV:       61 ml                            LV SV:        41 ml RIGHT VENTRICLE RV Basal diam:  3.00 cm RV S prime:     14.90 cm/s TAPSE (M-mode): 2.0 cm LEFT ATRIUM             Index        RIGHT ATRIUM           Index LA diam:        3.20 cm 2.09 cm/m   RA Area:     10.30 cm LA Vol (A2C):   51.0 ml 33.33 ml/m  RA Volume:   26.00 ml  16.99 ml/m LA Vol (A4C):   35.8 ml 23.39 ml/m LA Biplane Vol: 44.5 ml 29.08 ml/m  AORTIC VALVE                 PULMONIC  VALVE AV Area (Vmax): 2.23 cm     PV Vmax:        0.87 m/s AV Vmax:        120.00 cm/s  PV Peak grad:   3.0 mmHg AV Peak Grad:   5.8 mmHg     RVOT Peak grad: 2 mmHg LVOT Vmax:      105.00 cm/s LVOT Vmean:     65.800 cm/s LVOT VTI:       0.176 m  AORTA Ao Root diam: 3.40 cm MITRAL VALVE MV Area (PHT): 6.37 cm     SHUNTS MV Decel Time: 119 msec     Systemic VTI:  0.18 m MV E velocity: 106.00 cm/s  Systemic Diam: 1.80 cm Cara JONETTA Lovelace MD Electronically signed by Cara JONETTA Lovelace MD Signature Date/Time: 10/16/2023/9:49:51 AM    Final    DG Chest Portable 1 View Result Date: 10/15/2023 CLINICAL DATA:  COPD exacerbation EXAM: PORTABLE CHEST - 1 VIEW COMPARISON:  02/23/2020 FINDINGS: Pulmonary hyperinflation with attenuated bronchovascular markings in both upper lobes. No focal airspace disease. Mild interstitial prominence in the lung bases, increased from previous. Heart size and mediastinal contours are within normal limits. Aortic Atherosclerosis (  ICD10-170.0). Mild blunting of the lateral costophrenic angles. Visualized bones unremarkable. IMPRESSION: 1. Pulmonary hyperinflation with mild bibasilar interstitial prominence. 2. Possible small pleural effusions. Electronically Signed   By: JONETTA Faes M.D.   On: 10/15/2023 21:15    ECHO as above  TELEMETRY reviewed by me 10/25/2023: sinus rhythm with PACs, rate 80s  EKG reviewed by me: atrial flutter, rate 158 bpm (new onset).  Data reviewed by me 10/25/2023: last 24h vitals tele labs imaging I/O hospitalist progress notes.  Principal Problem:   Acute on chronic respiratory failure with hypoxia and hypercapnia (HCC) Active Problems:   COPD with acute exacerbation (HCC)   DM (diabetes mellitus), type 2 (HCC)   Transaminitis   HTN (hypertension)   HLD (hyperlipidemia)   Osteoporosis   Palliative care encounter   Protein-calorie malnutrition, severe   Acute systolic (congestive) heart failure (HCC)   Paroxysmal atrial fibrillation (HCC)    Hypotension   Hematuria    ASSESSMENT AND PLAN:  Monica Stewart is a 78 y.o. female  with a past medical history of hypertension, hyperlipidemia, chronic respiratory failure (3L), COPD, pulmonary nodule and patient reports hx of atrial flutter (years ago) who presented to the ED on 10/15/2023 for worsening fatigue, lower extremity swelling, fatigue for months.  Patient endorses orthopnea.  EKG on 07/21 and telemetry new onset atrial flutter relation.  Echo during this admission revealed a reduced EF 35-45%. Cardiology was consulted for further evaluation.  # New onset HFrEF # Acute on chronic respiratory failure # COPD exacerbation BNP elevated at 1100.  Chest x-ray with pulmonary vascular congestion.  This admission reveals newly reduced EF of 35-40% with global hypokinesis likely due to AF/AFL RVR. -Continue home PO lasix  20 mg every other. -Continue to hold Entresto  due to borderline BP. Will reassess BP outpatient to evaluate tolerability of ARB vs ACE (had cough in past with lisinopril-hydrochlorothiazide ).  -Continue dapagliflozin  10 mg daily. (Copay $0) -Continue to hold bisoprolol  due to borderline BP. Will reassess at outpatient follow-up. -Continue spironolactone  12.5 mg daily. -Pulmonology following and managing COPD/Lung disease.   # New onset Atrial fibrillation/flutter RVR # Paroxsymal atrial fibrillation EKG (07/21) with atrial flutter, rate 158 bpm (new onset). Per tele episode atrial fibrillation RVR. Per tele now remains in sinus tachycardia rate 100s. Per tele a few episodes of AF/AFL RVR last night and early this morning (07/23 & 07/24) -Continue Eliquis  5 mg twice daily for stroke risk reduction.  CHA2DS2-VASc score is at least 6. Patient denies any recent falls, or prior major bleeding. Per tele remains in SR with controlled rate s/p amio.  -BP limiting rate control options. -Rhythm control medications limited due to severe underlying lung disease. -Continue Amio 400 mg  BID for 10 days, then 200 mg daily (start on 08/03) for rhythm control. Not a long term option due to patients severe COPD. Consulted pulmonology and states no evidence of pulmonary fibrosis and gave okay to start Amio. -Cardiac monitor placed to assess AF/AFL, PVC burden.   # Hypertension # Hyperlipidemia Troponins minimally elevated and flat 33 > 48. EKG without acute ischemic changes. BP stable. -See plan for meds as above.  No further cardiac recommendations at this time. Cardiology will sign off. Please haiku with questions or re-engage if needed.   This patient's plan of care was discussed and created with Dr. Wilburn and he is in agreement.  Signed: Dorene Comfort, PA-C  10/25/2023, 9:55 AM [facility] Cardiology

## 2023-10-25 NOTE — TOC Transition Note (Incomplete)
 Transition of Care [facility]) - Discharge Note   Patient Details  Name: Monica Stewart MRN: [id number] Date of Birth: 1946/01/01  [facility] CM/SW Contact:  Maxxon Schwanke C Genever Hentges, RN Phone Number: 10/25/2023, 10:33 AM   Clinical Narrative:    Spoke with Tammy in admissions . Per facility patient admission confirmed for today. Patient assigned room # A-10 Report will be called to [phone] Face sheet and medical necessity forms printed to the floor to be added to the EMS pack EMS arranged for 11:30 Discharge summary and SNF transfer report sent in HUB.  Nurse, and family notified spoke with Nat  TOC signing off.    Final next level of care: Skilled Nursing Facility Barriers to Discharge: Barriers Resolved   Patient Goals and CMS Choice Patient states their goals for this hospitalization and ongoing recovery are:: SNF          Discharge Placement              Patient chooses bed at: [facility] Patient to be transferred to facility by: [facility]      Discharge Plan and Services Additional resources added to the After Visit Summary for                                       Social Drivers of Health (SDOH) Interventions SDOH Screenings   Food Insecurity: Food Insecurity Present (06/27/2023)   Received [facility]  Housing: Low Risk  (06/27/2023)   Received from [facility]  Transportation Needs: No Transportation Needs (06/27/2023)   Received from [facility]  Utilities: Not At Risk (06/27/2023)   Received from [facility]  Financial Resource Strain: Low Risk  (06/27/2023)   Received from [facility]  Tobacco Use: Medium Risk (10/15/2023)     Readmission Risk Interventions     No data to display

## 2023-10-25 NOTE — Discharge Summary (Signed)
 Physician Discharge Summary   Patient: Monica Stewart MRN: [id number] DOB: Dec 31, 1945  Admit date:     10/15/2023  Discharge date: 10/25/23  Discharge Physician: Moesha Sarchet   PCP: Rudolpho Norleen BIRCH, MD   Recommendations at discharge:   Take medications as recommended Check daily weights.  Contact your primary care provider if you gain more than 2 pounds in 24 hours or 5 pounds in 1 week Check blood sugars daily Maintain 1.5 L fluid restriction  Discharge Diagnoses: Principal Problem:   Acute on chronic respiratory failure with hypoxia and hypercapnia (HCC) Active Problems:   Acute systolic (congestive) heart failure (HCC)   Hypotension   COPD with acute exacerbation (HCC)   DM (diabetes mellitus), type 2 (HCC)   Transaminitis   HTN (hypertension)   HLD (hyperlipidemia)   Osteoporosis   Palliative care encounter   Protein-calorie malnutrition, severe   Paroxysmal atrial fibrillation (HCC)   Hematuria  Resolved Problems:   * No resolved hospital problems. *  Hospital Course: 78 year old female with a past medical history of chronic respiratory failure on 3 L home oxygen , chronic obstructive pulmonary disorder FEV1 of 0.6 L, pulmonary nodule, gastroesophageal reflux disease who presents to the emergency department with increasing shortness of breath and dependent edema the patient is talking fluently on BiPAP reporting shortness of breath worsening since yesterday she reports increased sputum production and slightly increased cough denies any fever or chills she also reports approximately 2 months of increasing dependent edema she does endorse orthopnea. Patient does not smoke currently however was a heavy smoker many years ago. She follows with Dr. Theotis of pulmonary as outpatient   7/26.  Blood pressure in the 70s after Entresto  dose.  Case discussed with cardiology and they will hold Entresto  at this point and continue to monitor blood pressure.   7/27 Bisoprolol   discontinued by cardiology due to relative hypotension   7/28 No new complaints    Assessment and Plan:  Acute on chronic respiratory failure with hypoxia and hypercapnia (HCC) Chronically on 3 L.  Required BiPAP during the hospital course but has been weaned off.  Currently back on her baseline home oxygen  requirement.  Initially required IV steroids but now on oral steroid taper. Follow-up with pulmonary as an outpatient   Acute systolic (congestive) heart failure (HCC) EF of 35 to 40%. Entresto  and bisoprolol  remain on hold due to relative hypotension.   Currently on spironolactone , oral Lasix  has been switched to every other day due to worsening renal function and Farxiga      COPD with acute exacerbation (HCC) Improved Currently on oral prednisone .  Required IV Solu-Medrol  earlier in the hospital course Continue as needed and scheduled bronchodilator therapy.     DM (diabetes mellitus), type 2 (HCC) Continue sliding scale insulin .  Hemoglobin A1c 6.7.  Improve glycemic control     Hematuria Suspected UTI No further episodes of hematuria Hemoglobin is stable Urine culture yields 10,000 colonies of Pseudomonas and 20,000 colonies of E faecalis. No indication for antibiotic therapy at this time     Paroxysmal atrial fibrillation [facility]) Patient on amiodarone , Zebeta  and Eliquis . Follow-up with cardiology as an outpatient     Osteoporosis Continue bisphosphonate as outpatient     HLD (hyperlipidemia) Holding statin with elevated liver function test     Transaminitis Holding statin.  Continue to monitor liver function tests.  Likely from heart failure.     Severe malnutrition Severe Malnutrition related to chronic illness as evidenced by severe fat depletion,  severe muscle depletion.       Consultants: Cardiology, pulmonary, palliative care Procedures performed: 2D echocardiogram Disposition: Skilled nursing facility Diet recommendation:  Discharge Diet  Orders (From admission, onward)     Start     Ordered   10/25/23 0000  Diet - low sodium heart healthy        10/25/23 0912   10/25/23 0000  Diet Carb Modified        10/25/23 0912           Dysphagia type 2 thin Liquid DISCHARGE MEDICATION: Allergies as of 10/25/2023       Reactions   Lisinopril-hydrochlorothiazide  Cough        Medication List     STOP taking these medications    amLODipine 2.5 MG tablet Commonly known as: NORVASC   hydrochlorothiazide  25 MG tablet Commonly known as: HYDRODIURIL    Ipratropium-Albuterol  20-100 MCG/ACT Aers respimat Commonly known as: COMBIVENT    losartan  100 MG tablet Commonly known as: COZAAR        TAKE these medications    albuterol  108 (90 Base) MCG/ACT inhaler Commonly known as: VENTOLIN  HFA Inhale 1-2 puffs into the lungs every 6 (six) hours as needed for wheezing or shortness of breath.   alendronate 70 MG tablet Commonly known as: FOSAMAX Take 70 mg by mouth once a week.   amiodarone  200 MG tablet Commonly known as: PACERONE  Take 2 tablets (400 mg total) by mouth 2 (two) times daily for 3 days, THEN 1 tablet (200 mg total) daily. Start taking on: October 25, 2023   apixaban  5 MG Tabs tablet Commonly known as: ELIQUIS  Take 1 tablet (5 mg total) by mouth 2 (two) times daily.   Azelastine HCl 137 MCG/SPRAY Soln Place 1 spray into both nostrils 2 (two) times daily.   calcium  citrate-vitamin D  500-400 MG-UNIT chewable tablet Chew 1 tablet by mouth daily.   dapagliflozin  propanediol 10 MG Tabs tablet Commonly known as: FARXIGA  Take 1 tablet (10 mg total) by mouth daily.   feeding supplement (GLUCERNA SHAKE) Liqd Take 237 mLs by mouth 3 (three) times daily between meals.   Ferrous Fumarate 324 (106 Fe) MG Tabs tablet Commonly known as: HEMOCYTE - 106 mg FE Take 1 tablet by mouth every other day.   fluticasone  50 MCG/ACT nasal spray Commonly known as: FLONASE  Place 2 sprays into both nostrils daily.    furosemide  20 MG tablet Commonly known as: LASIX  Take 1 tablet (20 mg total) by mouth every other day. What changed: when to take this   GERITOL PO Take 1 tablet by mouth daily.   insulin  aspart 100 UNIT/ML injection Commonly known as: novoLOG  Inject 0-9 Units into the skin 3 (three) times daily with meals.   levalbuterol  0.63 MG/3ML nebulizer solution Commonly known as: XOPENEX  Take 3 mLs (0.63 mg total) by nebulization every 8 (eight) hours as needed for wheezing or shortness of breath.   loratadine  10 MG tablet Commonly known as: CLARITIN  Take 10 mg by mouth daily.   lovastatin 40 MG tablet Commonly known as: MEVACOR Take 40 mg by mouth at bedtime.   meloxicam 7.5 MG tablet Commonly known as: MOBIC Take 7.5 mg by mouth daily.   multivitamin with minerals tablet Take 1 tablet by mouth daily.   NON FORMULARY Take 1 capsule by mouth daily.   polyethylene glycol 17 g packet Commonly known as: MIRALAX  / GLYCOLAX  Take 17 g by mouth daily as needed for mild constipation.   predniSONE  5 MG tablet Commonly known  as: DELTASONE  Take 4 tablets (20 mg total) by mouth daily with breakfast for 1 day, THEN 2 tablets (10 mg total) daily with breakfast for 1 day, THEN 1 tablet (5 mg total) daily with breakfast. Start taking on: October 26, 2023 What changed: See the new instructions.   Roflumilast 250 MCG Tabs Take 250 mcg by mouth daily.   spironolactone  25 MG tablet Commonly known as: ALDACTONE  Take 0.5 tablets (12.5 mg total) by mouth daily.   Trelegy Ellipta 100-62.5-25 MCG/INH Aepb Generic drug: Fluticasone -Umeclidin-Vilant Inhale 1 puff into the lungs daily.        Follow-up Information     Fritch, Caralyn, PA-C. Go in 1 week(s).   Specialty: Cardiology Why: [facility] on 10/26/23 at 11 AM Contact information: [address] [place] [address] [phone]                Discharge Exam: Fredricka Weights   10/18/23  0500 10/19/23 0500 10/20/23 0500  Weight: 51.7 kg 56.2 kg 52.9 kg   Head: Normocephalic.     Mouth/Throat:     Pharynx: No oropharyngeal exudate.  Eyes:     General: Lids are normal.     Conjunctiva/sclera: Conjunctivae normal.  Cardiovascular:     Rate and Rhythm: Normal rate and regular rhythm.     Heart sounds: Normal heart sounds, S1 normal and S2 normal.  Pulmonary:     Breath sounds: Bilateral air entry, scattered Rales and wheezes Abdominal:     Palpations: Abdomen is soft.     Tenderness: There is no abdominal tenderness.  Musculoskeletal:     Right lower leg: No swelling.     Left lower leg: No swelling.  Skin:    General: Skin is warm.     Findings: No rash.  Neurological:     Mental Status: She is alert   Condition at discharge: stable  The results of significant diagnostics from this hospitalization (including imaging, microbiology, ancillary and laboratory) are listed below for reference.   Imaging Studies: US  RENAL Result Date: 10/22/2023 CLINICAL DATA:  Hematuria EXAM: RENAL / URINARY TRACT ULTRASOUND COMPLETE COMPARISON:  CT scan 12/20/2018 FINDINGS: Right Kidney: Renal measurements: 8.3 by 3.7 by 4.8 cm = volume: 77 mL. Echogenicity within normal limits. No mass or hydronephrosis visualized. Left Kidney: Renal measurements: 9.8 by 4.1 by 4.4 cm = volume: 91 mL. Echogenicity within normal limits. No mass or hydronephrosis visualized. Sub-centimeter small single cysts in the mid kidneys are clinically inconsequential and merit no further imaging follow up. Bladder: Appears normal for degree of bladder distention. Other: A 3.7 by 3.0 by 3.2 cm nonspecific solid hypoechoic lesion is present in the spleen. This nonspecific finding could be further characterized with MRI of the abdomen with and without contrast, if clinically warranted. IMPRESSION: 1. No specific cause for hematuria is identified on today's ultrasound. 2. Nonspecific 3.7 cm solid hypoechoic lesion in the  spleen. This nonspecific finding could be further characterized with MRI of the abdomen with and without contrast, if clinically warranted. Electronically Signed   By: Ryan Salvage M.D.   On: 10/22/2023 19:00   ECHOCARDIOGRAM COMPLETE Result Date: 10/16/2023    ECHOCARDIOGRAM REPORT   Patient Name:   KHYLA MCCUMBERS Date of Exam: 10/16/2023 Medical Rec #:  [id number]       Height:       64.0 in Accession #:    [id number]      Weight:  112.2 lb Date of Birth:  [birth year]       BSA:          1.530 m Patient Age:    77 years        BP:           137/88 mmHg Patient Gender: F               HR:           115 bpm. Exam Location:  [facility] Procedure: 2D Echo, 3D Echo, Cardiac Doppler, Color Doppler and Strain Analysis            (Both Spectral and Color Flow Doppler were utilized during            procedure). Indications:     Elevated Troponin  History:         Patient has prior history of Echocardiogram examinations, most                  recent 05/03/2023.  Sonographer:     Thedora Louder RDCS, FASE Referring Phys:  [id number] PRENTICE BROCKS CORE Diagnosing Phys: Cara JONETTA Lovelace MD  Sonographer Comments: Global longitudinal strain was attempted. IMPRESSIONS  1. Left ventricular ejection fraction, by estimation, is 35 to 40%. The left ventricle has moderately decreased function. The left ventricle demonstrates global hypokinesis. The left ventricular internal cavity size was mildly dilated. Left ventricular diastolic function could not be evaluated. The average left ventricular global longitudinal strain is 11.1 %. The global longitudinal strain is abnormal.  2. Right ventricular systolic function is low normal. The right ventricular size is mildly enlarged.  3. The mitral valve is normal in structure. Trivial mitral valve regurgitation.  4. The aortic valve is normal in structure. Aortic valve regurgitation is not visualized. Aortic valve sclerosis is present, with no evidence of aortic valve stenosis. FINDINGS  Left  Ventricle: Left ventricular ejection fraction, by estimation, is 35 to 40%. The left ventricle has moderately decreased function. The left ventricle demonstrates global hypokinesis. The average left ventricular global longitudinal strain is 11.1 %.  Strain was performed and the global longitudinal strain is abnormal. The left ventricular internal cavity size was mildly dilated. There is no left ventricular hypertrophy. Left ventricular diastolic function could not be evaluated. Right Ventricle: The right ventricular size is mildly enlarged. No increase in right ventricular wall thickness. Right ventricular systolic function is low normal. Left Atrium: Left atrial size was normal in size. Right Atrium: Right atrial size was normal in size. Pericardium: There is no evidence of pericardial effusion. Mitral Valve: The mitral valve is normal in structure. Trivial mitral valve regurgitation. Tricuspid Valve: The tricuspid valve is normal in structure. Tricuspid valve regurgitation is mild. Aortic Valve: The aortic valve is normal in structure. Aortic valve regurgitation is not visualized. Aortic valve sclerosis is present, with no evidence of aortic valve stenosis. Aortic valve peak gradient measures 5.8 mmHg. Pulmonic Valve: The pulmonic valve was normal in structure. Pulmonic valve regurgitation is not visualized. Aorta: The ascending aorta was not well visualized. IAS/Shunts: No atrial level shunt detected by color flow Doppler. Additional Comments: 3D was performed not requiring image post processing on an independent workstation and was abnormal.  LEFT VENTRICLE PLAX 2D LVIDd:         2.75 cm     Diastology LVIDs:         3.60 cm     LV e' medial:    19.00 cm/s LV PW:  1.00 cm     LV E/e' medial:  5.6 LV IVS:        3.15 cm     LV e' lateral:   13.80 cm/s LVOT diam:     1.80 cm     LV E/e' lateral: 7.7 LV SV:         45 LV SV Index:   29          2D Longitudinal Strain LVOT Area:     2.54 cm    2D Strain GLS  (A4C):   10.2 %                            2D Strain GLS (A3C):   11.4 %                            2D Strain GLS (A2C):   11.7 % LV Volumes (MOD)           2D Strain GLS Avg:     11.1 % LV vol d, MOD A2C: 68.8 ml LV vol d, MOD A4C: 81.0 ml LV vol s, MOD A2C: 42.5 ml LV vol s, MOD A4C: 47.9 ml 3D Volume EF: LV SV MOD A2C:     26.3 ml 3D EF:        40 % LV SV MOD A4C:     81.0 ml LV EDV:       102 ml LV SV MOD BP:      29.1 ml LV ESV:       61 ml                            LV SV:        41 ml RIGHT VENTRICLE RV Basal diam:  3.00 cm RV S prime:     14.90 cm/s TAPSE (M-mode): 2.0 cm LEFT ATRIUM             Index        RIGHT ATRIUM           Index LA diam:        3.20 cm 2.09 cm/m   RA Area:     10.30 cm LA Vol (A2C):   51.0 ml 33.33 ml/m  RA Volume:   26.00 ml  16.99 ml/m LA Vol (A4C):   35.8 ml 23.39 ml/m LA Biplane Vol: 44.5 ml 29.08 ml/m  AORTIC VALVE                 PULMONIC VALVE AV Area (Vmax): 2.23 cm     PV Vmax:        0.87 m/s AV Vmax:        120.00 cm/s  PV Peak grad:   3.0 mmHg AV Peak Grad:   5.8 mmHg     RVOT Peak grad: 2 mmHg LVOT Vmax:      105.00 cm/s LVOT Vmean:     65.800 cm/s LVOT VTI:       0.176 m  AORTA Ao Root diam: 3.40 cm MITRAL VALVE MV Area (PHT): 6.37 cm     SHUNTS MV Decel Time: 119 msec     Systemic VTI:  0.18 m MV E velocity: 106.00 cm/s  Systemic Diam: 1.80 cm Dwayne JONETTA Lovelace MD Electronically signed by Cara JONETTA Lovelace MD Signature Date/Time: 10/16/2023/9:49:51 AM  Final    DG Chest Portable 1 View Result Date: 10/15/2023 CLINICAL DATA:  COPD exacerbation EXAM: PORTABLE CHEST - 1 VIEW COMPARISON:  02/23/2020 FINDINGS: Pulmonary hyperinflation with attenuated bronchovascular markings in both upper lobes. No focal airspace disease. Mild interstitial prominence in the lung bases, increased from previous. Heart size and mediastinal contours are within normal limits. Aortic Atherosclerosis (ICD10-170.0). Mild blunting of the lateral costophrenic angles. Visualized bones  unremarkable. IMPRESSION: 1. Pulmonary hyperinflation with mild bibasilar interstitial prominence. 2. Possible small pleural effusions. Electronically Signed   By: JONETTA Faes M.D.   On: 10/15/2023 21:15    Microbiology: Results for orders placed or performed during the hospital encounter of 10/15/23  Resp panel by RT-PCR (RSV, Flu A&B, Covid) Anterior Nasal Swab     Status: None   Collection Time: 10/16/23 12:41 AM   Specimen: Anterior Nasal Swab  Result Value Ref Range Status   SARS Coronavirus 2 by RT PCR NEGATIVE NEGATIVE Final    Comment: (NOTE) SARS-CoV-2 target nucleic acids are NOT DETECTED.  The SARS-CoV-2 RNA is generally detectable in upper respiratory specimens during the acute phase of infection. The lowest concentration of SARS-CoV-2 viral copies this assay can detect is 138 copies/mL. A negative result does not preclude SARS-Cov-2 infection and should not be used as the sole basis for treatment or other patient management decisions. A negative result may occur with  improper specimen collection/handling, submission of specimen other than nasopharyngeal swab, presence of viral mutation(s) within the areas targeted by this assay, and inadequate number of viral copies(<138 copies/mL). A negative result must be combined with clinical observations, patient history, and epidemiological information. The expected result is Negative.  Fact Sheet for Patients:  BloggerCourse.com  Fact Sheet for Healthcare Providers:  SeriousBroker.it  This test is no t yet approved or cleared by the [place]  FDA and  has been authorized for detection and/or diagnosis of SARS-CoV-2 by FDA under an Emergency Use Authorization (EUA). This EUA will remain  in effect (meaning this test can be used) for the duration of the COVID-19 declaration under Section 564(b)(1) of the Act, 21 U.S.C.section 360bbb-3(b)(1), unless the authorization is  terminated  or revoked sooner.       Influenza A by PCR NEGATIVE NEGATIVE Final   Influenza B by PCR NEGATIVE NEGATIVE Final    Comment: (NOTE) The Xpert Xpress SARS-CoV-2/FLU/RSV plus assay is intended as an aid in the diagnosis of influenza from Nasopharyngeal swab specimens and should not be used as a sole basis for treatment. Nasal washings and aspirates are unacceptable for Xpert Xpress SARS-CoV-2/FLU/RSV testing.  Fact Sheet for Patients: BloggerCourse.com  Fact Sheet for Healthcare Providers: SeriousBroker.it  This test is not yet approved or cleared by the [place]  FDA and has been authorized for detection and/or diagnosis of SARS-CoV-2 by FDA under an Emergency Use Authorization (EUA). This EUA will remain in effect (meaning this test can be used) for the duration of the COVID-19 declaration under Section 564(b)(1) of the Act, 21 U.S.C. section 360bbb-3(b)(1), unless the authorization is terminated or revoked.     Resp Syncytial Virus by PCR NEGATIVE NEGATIVE Final    Comment: (NOTE) Fact Sheet for Patients: BloggerCourse.com  Fact Sheet for Healthcare Providers: SeriousBroker.it  This test is not yet approved or cleared by the [place]  FDA and has been authorized for detection and/or diagnosis of SARS-CoV-2 by FDA under an Emergency Use Authorization (EUA). This EUA will remain in effect (meaning this test can be used) for the  duration of the COVID-19 declaration under Section 564(b)(1) of the Act, 21 U.S.C. section 360bbb-3(b)(1), unless the authorization is terminated or revoked.  Performed at [facility], [address] [place], [place] [address]   Urine Culture     Status: Abnormal   Collection Time: 10/22/23 11:56 AM   Specimen: Urine, Clean Catch  Result Value Ref Range Status   Specimen Description   Final    URINE, CLEAN  CATCH Performed at [facility], [address] [place], [place] [address]    Special Requests   Final    Normal Performed at [facility], [address] [place], [place] [address]    Culture (A)  Final    10,000 COLONIES/mL PSEUDOMONAS AERUGINOSA 20,000 COLONIES/mL ENTEROCOCCUS FAECALIS    Report Status 10/25/2023 FINAL  Final   Organism ID, Bacteria ENTEROCOCCUS FAECALIS (A)  Final   Organism ID, Bacteria PSEUDOMONAS AERUGINOSA (A)  Final      Susceptibility   Enterococcus faecalis - MIC*    AMPICILLIN <=2 SENSITIVE Sensitive     NITROFURANTOIN <=16 SENSITIVE Sensitive     VANCOMYCIN 1 SENSITIVE Sensitive     * 20,000 COLONIES/mL ENTEROCOCCUS FAECALIS   Pseudomonas aeruginosa - MIC*    CEFTAZIDIME 2 SENSITIVE Sensitive     CIPROFLOXACIN <=0.25 SENSITIVE Sensitive     GENTAMICIN <=1 SENSITIVE Sensitive     IMIPENEM 2 SENSITIVE Sensitive     PIP/TAZO <=4 SENSITIVE Sensitive ug/mL    CEFEPIME 0.5 SENSITIVE Sensitive     * 10,000 COLONIES/mL PSEUDOMONAS AERUGINOSA    Labs: CBC: Recent Labs  Lab 10/19/23 0408 10/22/23 0525 10/23/23 0551  WBC 12.6* 11.3*  --   HGB 11.9* 13.6 12.7  HCT 38.1 43.6  --   MCV 93.6 93.6  --   PLT 208 237  --    Basic Metabolic Panel: Recent Labs  Lab 10/21/23 0607 10/22/23 0525 10/23/23 0551 10/24/23 0755 10/25/23 0807  NA 142 143 137 141 136  K 5.0 4.9 4.6 5.1 5.3*  CL 87* 87* 86* 88* 89*  CO2 44* 42* 42* 42* 36*  GLUCOSE 155* 123* 129* 105* 85  BUN 43* 42* 49* 48* 45*  CREATININE 0.75 0.64 0.95 1.12* 1.04*  CALCIUM  10.5* 10.3 10.4* 10.0 9.9   Liver Function Tests: Recent Labs  Lab 10/20/23 0431 10/23/23 0551  AST 50* 23  ALT 113* 47*  ALKPHOS 66 61  BILITOT 0.4 0.5  PROT 6.2* 5.8*  ALBUMIN 3.2* 2.9*   CBG: Recent Labs  Lab 10/24/23 0729 10/24/23 1119 10/24/23 1636 10/24/23 2331 10/25/23 0754  GLUCAP 106* 135* 260* 118* 79    Discharge time spent: greater than 30  minutes.  Signed: Aimee Somerset, MD [facility] 10/25/2023

## 2023-10-25 NOTE — Progress Notes (Signed)
 Nutrition Follow-up  DOCUMENTATION CODES:   Severe malnutrition in context of chronic illness  INTERVENTION:   -D/c Boost Plus -Glucerna Shake po TID, each supplement provides 220 kcal and 10 grams of protein  -Continue Magic cup TID with meals, each supplement provides 290 kcal and 9 grams of protein  -Continue MVI with minerals daily  -RD educated pt on CHF management and low sodium diet; provided Low Sodium Nutrition Therapy handout from [facility] Nutrition Care Manual   NUTRITION DIAGNOSIS:   Severe Malnutrition related to chronic illness as evidenced by severe fat depletion, severe muscle depletion.  Ongoing  GOAL:   Patient will meet greater than or equal to 90% of their needs  Progressing   MONITOR:   PO intake, Supplement acceptance, Labs, Weight trends, I & O's, Skin  REASON FOR ASSESSMENT:   Consult Assessment of nutrition requirement/status  ASSESSMENT:   78 y/o female with h/o COPD, DM, NICM, HTN, HLD, asthma and GERD who is admitted with COPD exacerbation, new onset A flutter and new onset CHF.  7/23- s/p BSE- dysphagia 2 diet with thin liquids 7/25- s/p BSE- dysphagia 2 diet with thin liquids, weaned off Bi-pap  Reviewed I/O's: -410 ml x 24 hours and -8.9 L since admission  UOP: 650 ml x 24 hours  Spoke with pt at bedside, who was pleasant and in good spirits today; eager to engage RD in conversation. Pt shares that she feels a little overwhelmed with hospitalization and new CHF diagnosis, particularly since she was very functional at home.   Pt reports her intake has improved, however, she does not care for the hospital food. Meal completions 25-75%. Pt does not care for the Boost Plus and reports she is concerned about the sugar content; she prefers to consume Glucerna. Family has also been bringing ion home supply of supplements. Noted CBGS this AM was 76. Pt reports that she would like to have Glucerna supplements.   RD discussed CHF management with  pt, including low sodium diet. Pt reports she is trying to eat small, frequent meals per day and supplements to optimize oral intake. Pt shares she is trying to eat more heart healthy Discussed importance of sodium restriction to assist with fluid retention.   Palliative care following for goals of care. Plan for short term SNF vs home health. Pt reports she is willing to do whatever they tell me I need to do so I can go home.   Wt has been stable since admission.   Medications reviewed and include lasix  and prednisone .   Labs reviewed: K: 5.3, Mg and Phos WDL. CBGS: 79-260 (inpatient orders for glycemic control are 0-5 units insulin  aspart daily at bedtime and 0-9 units insulin  aspart TID with meals).    Diet Order:   Diet Order             Diet - low sodium heart healthy           Diet Carb Modified           DIET DYS 2 Fluid consistency: Thin  Diet effective now                   EDUCATION NEEDS:   Education needs have been addressed  Skin:  Skin Assessment: Reviewed RN Assessment  Last BM:  10/24/23 (type 4)  Height:   Ht Readings from Last 1 Encounters:  10/16/23 5' 4 (1.626 m)    Weight:   Wt Readings from Last 1 Encounters:  10/20/23 52.9 kg    Ideal Body Weight:  54.5 kg  BMI:  Body mass index is 20.02 kg/m.  Estimated Nutritional Needs:   Kcal:  1600-1800  Protein:  80-95 grams  Fluid:  1.6-1.8 L    Margery ORN, RD, LDN, CDCES Registered Dietitian III Certified Diabetes Care and Education Specialist If unable to reach this RD, please use RD Inpatient group chat on secure chat between hours of 8am-4 pm daily

## 2023-10-25 NOTE — Plan of Care (Signed)
  Problem: Pain Managment: Goal: General experience of comfort will improve and/or be controlled Outcome: Progressing   Problem: Safety: Goal: Ability to remain free from injury will improve Outcome: Progressing   Problem: Activity: Goal: Ability to tolerate increased activity will improve Outcome: Progressing   Problem: Respiratory: Goal: Ability to maintain a clear airway will improve Outcome: Progressing

## 2023-10-25 NOTE — Discharge Instructions (Signed)

## 2023-10-26 DIAGNOSIS — E7800 Pure hypercholesterolemia, unspecified: Secondary | ICD-10-CM | POA: Diagnosis not present

## 2023-10-27 DIAGNOSIS — J440 Chronic obstructive pulmonary disease with acute lower respiratory infection: Secondary | ICD-10-CM | POA: Diagnosis not present

## 2023-10-27 DIAGNOSIS — I5023 Acute on chronic systolic (congestive) heart failure: Secondary | ICD-10-CM | POA: Diagnosis not present

## 2023-10-27 DIAGNOSIS — I480 Paroxysmal atrial fibrillation: Secondary | ICD-10-CM | POA: Diagnosis not present

## 2023-10-27 DIAGNOSIS — E119 Type 2 diabetes mellitus without complications: Secondary | ICD-10-CM | POA: Diagnosis not present

## 2023-10-31 DIAGNOSIS — E785 Hyperlipidemia, unspecified: Secondary | ICD-10-CM | POA: Diagnosis not present

## 2023-10-31 DIAGNOSIS — E119 Type 2 diabetes mellitus without complications: Secondary | ICD-10-CM | POA: Diagnosis not present

## 2023-11-02 DIAGNOSIS — G4733 Obstructive sleep apnea (adult) (pediatric): Secondary | ICD-10-CM | POA: Diagnosis not present

## 2023-11-02 DIAGNOSIS — J9621 Acute and chronic respiratory failure with hypoxia: Secondary | ICD-10-CM | POA: Diagnosis not present

## 2023-11-02 DIAGNOSIS — F064 Anxiety disorder due to known physiological condition: Secondary | ICD-10-CM | POA: Diagnosis not present

## 2023-11-03 DIAGNOSIS — I480 Paroxysmal atrial fibrillation: Secondary | ICD-10-CM | POA: Diagnosis not present

## 2023-11-03 DIAGNOSIS — I4891 Unspecified atrial fibrillation: Secondary | ICD-10-CM | POA: Diagnosis not present

## 2023-11-03 DIAGNOSIS — J301 Allergic rhinitis due to pollen: Secondary | ICD-10-CM | POA: Diagnosis not present

## 2023-11-03 DIAGNOSIS — J449 Chronic obstructive pulmonary disease, unspecified: Secondary | ICD-10-CM | POA: Diagnosis not present

## 2023-11-03 DIAGNOSIS — I493 Ventricular premature depolarization: Secondary | ICD-10-CM | POA: Diagnosis not present

## 2023-11-03 DIAGNOSIS — R918 Other nonspecific abnormal finding of lung field: Secondary | ICD-10-CM | POA: Diagnosis not present

## 2023-11-03 DIAGNOSIS — R0602 Shortness of breath: Secondary | ICD-10-CM | POA: Diagnosis not present

## 2023-11-03 DIAGNOSIS — E119 Type 2 diabetes mellitus without complications: Secondary | ICD-10-CM | POA: Diagnosis not present

## 2023-11-03 DIAGNOSIS — N182 Chronic kidney disease, stage 2 (mild): Secondary | ICD-10-CM | POA: Diagnosis not present

## 2023-11-03 DIAGNOSIS — J9621 Acute and chronic respiratory failure with hypoxia: Secondary | ICD-10-CM | POA: Diagnosis not present

## 2023-11-03 DIAGNOSIS — J439 Emphysema, unspecified: Secondary | ICD-10-CM | POA: Diagnosis not present

## 2023-11-03 DIAGNOSIS — I5020 Unspecified systolic (congestive) heart failure: Secondary | ICD-10-CM | POA: Diagnosis not present

## 2023-11-03 DIAGNOSIS — I10 Essential (primary) hypertension: Secondary | ICD-10-CM | POA: Diagnosis not present

## 2023-11-03 DIAGNOSIS — I4892 Unspecified atrial flutter: Secondary | ICD-10-CM | POA: Diagnosis not present

## 2023-11-03 DIAGNOSIS — I5023 Acute on chronic systolic (congestive) heart failure: Secondary | ICD-10-CM | POA: Diagnosis not present

## 2023-11-08 DIAGNOSIS — J441 Chronic obstructive pulmonary disease with (acute) exacerbation: Secondary | ICD-10-CM | POA: Diagnosis not present

## 2023-11-08 DIAGNOSIS — I480 Paroxysmal atrial fibrillation: Secondary | ICD-10-CM | POA: Diagnosis not present

## 2023-11-08 DIAGNOSIS — J9621 Acute and chronic respiratory failure with hypoxia: Secondary | ICD-10-CM | POA: Diagnosis not present

## 2023-11-08 DIAGNOSIS — E43 Unspecified severe protein-calorie malnutrition: Secondary | ICD-10-CM | POA: Diagnosis not present

## 2023-11-08 DIAGNOSIS — I5033 Acute on chronic diastolic (congestive) heart failure: Secondary | ICD-10-CM | POA: Diagnosis not present

## 2023-11-08 DIAGNOSIS — E1111 Type 2 diabetes mellitus with ketoacidosis with coma: Secondary | ICD-10-CM | POA: Diagnosis not present

## 2023-11-08 DIAGNOSIS — J9622 Acute and chronic respiratory failure with hypercapnia: Secondary | ICD-10-CM | POA: Diagnosis not present

## 2023-11-08 DIAGNOSIS — I5021 Acute systolic (congestive) heart failure: Secondary | ICD-10-CM | POA: Diagnosis not present

## 2023-11-08 DIAGNOSIS — I110 Hypertensive heart disease with heart failure: Secondary | ICD-10-CM | POA: Diagnosis not present

## 2023-11-09 DIAGNOSIS — I5033 Acute on chronic diastolic (congestive) heart failure: Secondary | ICD-10-CM | POA: Diagnosis not present

## 2023-11-09 DIAGNOSIS — E43 Unspecified severe protein-calorie malnutrition: Secondary | ICD-10-CM | POA: Diagnosis not present

## 2023-11-09 DIAGNOSIS — I110 Hypertensive heart disease with heart failure: Secondary | ICD-10-CM | POA: Diagnosis not present

## 2023-11-09 DIAGNOSIS — I5021 Acute systolic (congestive) heart failure: Secondary | ICD-10-CM | POA: Diagnosis not present

## 2023-11-09 DIAGNOSIS — J441 Chronic obstructive pulmonary disease with (acute) exacerbation: Secondary | ICD-10-CM | POA: Diagnosis not present

## 2023-11-09 DIAGNOSIS — E1111 Type 2 diabetes mellitus with ketoacidosis with coma: Secondary | ICD-10-CM | POA: Diagnosis not present

## 2023-11-09 DIAGNOSIS — J9622 Acute and chronic respiratory failure with hypercapnia: Secondary | ICD-10-CM | POA: Diagnosis not present

## 2023-11-09 DIAGNOSIS — I480 Paroxysmal atrial fibrillation: Secondary | ICD-10-CM | POA: Diagnosis not present

## 2023-11-09 DIAGNOSIS — J9621 Acute and chronic respiratory failure with hypoxia: Secondary | ICD-10-CM | POA: Diagnosis not present

## 2023-11-10 DIAGNOSIS — I5022 Chronic systolic (congestive) heart failure: Secondary | ICD-10-CM | POA: Diagnosis not present

## 2023-11-10 DIAGNOSIS — J9611 Chronic respiratory failure with hypoxia: Secondary | ICD-10-CM | POA: Diagnosis not present

## 2023-11-10 DIAGNOSIS — J439 Emphysema, unspecified: Secondary | ICD-10-CM | POA: Diagnosis not present

## 2023-11-10 DIAGNOSIS — Z09 Encounter for follow-up examination after completed treatment for conditions other than malignant neoplasm: Secondary | ICD-10-CM | POA: Diagnosis not present

## 2023-11-10 DIAGNOSIS — E119 Type 2 diabetes mellitus without complications: Secondary | ICD-10-CM | POA: Diagnosis not present

## 2023-11-14 DIAGNOSIS — J9621 Acute and chronic respiratory failure with hypoxia: Secondary | ICD-10-CM | POA: Diagnosis not present

## 2023-11-14 DIAGNOSIS — J9622 Acute and chronic respiratory failure with hypercapnia: Secondary | ICD-10-CM | POA: Diagnosis not present

## 2023-11-14 DIAGNOSIS — I5033 Acute on chronic diastolic (congestive) heart failure: Secondary | ICD-10-CM | POA: Diagnosis not present

## 2023-11-14 DIAGNOSIS — E1111 Type 2 diabetes mellitus with ketoacidosis with coma: Secondary | ICD-10-CM | POA: Diagnosis not present

## 2023-11-14 DIAGNOSIS — E43 Unspecified severe protein-calorie malnutrition: Secondary | ICD-10-CM | POA: Diagnosis not present

## 2023-11-14 DIAGNOSIS — I5021 Acute systolic (congestive) heart failure: Secondary | ICD-10-CM | POA: Diagnosis not present

## 2023-11-14 DIAGNOSIS — I480 Paroxysmal atrial fibrillation: Secondary | ICD-10-CM | POA: Diagnosis not present

## 2023-11-14 DIAGNOSIS — I110 Hypertensive heart disease with heart failure: Secondary | ICD-10-CM | POA: Diagnosis not present

## 2023-11-14 DIAGNOSIS — J441 Chronic obstructive pulmonary disease with (acute) exacerbation: Secondary | ICD-10-CM | POA: Diagnosis not present

## 2023-11-15 DIAGNOSIS — E43 Unspecified severe protein-calorie malnutrition: Secondary | ICD-10-CM | POA: Diagnosis not present

## 2023-11-15 DIAGNOSIS — E1111 Type 2 diabetes mellitus with ketoacidosis with coma: Secondary | ICD-10-CM | POA: Diagnosis not present

## 2023-11-15 DIAGNOSIS — J441 Chronic obstructive pulmonary disease with (acute) exacerbation: Secondary | ICD-10-CM | POA: Diagnosis not present

## 2023-11-15 DIAGNOSIS — I480 Paroxysmal atrial fibrillation: Secondary | ICD-10-CM | POA: Diagnosis not present

## 2023-11-15 DIAGNOSIS — J9622 Acute and chronic respiratory failure with hypercapnia: Secondary | ICD-10-CM | POA: Diagnosis not present

## 2023-11-15 DIAGNOSIS — J9621 Acute and chronic respiratory failure with hypoxia: Secondary | ICD-10-CM | POA: Diagnosis not present

## 2023-11-15 DIAGNOSIS — I110 Hypertensive heart disease with heart failure: Secondary | ICD-10-CM | POA: Diagnosis not present

## 2023-11-15 DIAGNOSIS — I5033 Acute on chronic diastolic (congestive) heart failure: Secondary | ICD-10-CM | POA: Diagnosis not present

## 2023-11-15 DIAGNOSIS — I5021 Acute systolic (congestive) heart failure: Secondary | ICD-10-CM | POA: Diagnosis not present

## 2023-11-16 DIAGNOSIS — I5021 Acute systolic (congestive) heart failure: Secondary | ICD-10-CM | POA: Diagnosis not present

## 2023-11-16 DIAGNOSIS — J441 Chronic obstructive pulmonary disease with (acute) exacerbation: Secondary | ICD-10-CM | POA: Diagnosis not present

## 2023-11-16 DIAGNOSIS — E43 Unspecified severe protein-calorie malnutrition: Secondary | ICD-10-CM | POA: Diagnosis not present

## 2023-11-16 DIAGNOSIS — I110 Hypertensive heart disease with heart failure: Secondary | ICD-10-CM | POA: Diagnosis not present

## 2023-11-16 DIAGNOSIS — J9622 Acute and chronic respiratory failure with hypercapnia: Secondary | ICD-10-CM | POA: Diagnosis not present

## 2023-11-16 DIAGNOSIS — J9621 Acute and chronic respiratory failure with hypoxia: Secondary | ICD-10-CM | POA: Diagnosis not present

## 2023-11-16 DIAGNOSIS — E1111 Type 2 diabetes mellitus with ketoacidosis with coma: Secondary | ICD-10-CM | POA: Diagnosis not present

## 2023-11-16 DIAGNOSIS — I5033 Acute on chronic diastolic (congestive) heart failure: Secondary | ICD-10-CM | POA: Diagnosis not present

## 2023-11-16 DIAGNOSIS — I480 Paroxysmal atrial fibrillation: Secondary | ICD-10-CM | POA: Diagnosis not present

## 2023-11-23 DIAGNOSIS — I480 Paroxysmal atrial fibrillation: Secondary | ICD-10-CM | POA: Diagnosis not present

## 2023-11-23 DIAGNOSIS — I5033 Acute on chronic diastolic (congestive) heart failure: Secondary | ICD-10-CM | POA: Diagnosis not present

## 2023-11-23 DIAGNOSIS — I5021 Acute systolic (congestive) heart failure: Secondary | ICD-10-CM | POA: Diagnosis not present

## 2023-11-23 DIAGNOSIS — J9621 Acute and chronic respiratory failure with hypoxia: Secondary | ICD-10-CM | POA: Diagnosis not present

## 2023-11-23 DIAGNOSIS — E43 Unspecified severe protein-calorie malnutrition: Secondary | ICD-10-CM | POA: Diagnosis not present

## 2023-11-23 DIAGNOSIS — I110 Hypertensive heart disease with heart failure: Secondary | ICD-10-CM | POA: Diagnosis not present

## 2023-11-23 DIAGNOSIS — J9622 Acute and chronic respiratory failure with hypercapnia: Secondary | ICD-10-CM | POA: Diagnosis not present

## 2023-11-23 DIAGNOSIS — J441 Chronic obstructive pulmonary disease with (acute) exacerbation: Secondary | ICD-10-CM | POA: Diagnosis not present

## 2023-11-23 DIAGNOSIS — E1111 Type 2 diabetes mellitus with ketoacidosis with coma: Secondary | ICD-10-CM | POA: Diagnosis not present

## 2023-11-24 DIAGNOSIS — J9622 Acute and chronic respiratory failure with hypercapnia: Secondary | ICD-10-CM | POA: Diagnosis not present

## 2023-11-24 DIAGNOSIS — I110 Hypertensive heart disease with heart failure: Secondary | ICD-10-CM | POA: Diagnosis not present

## 2023-11-24 DIAGNOSIS — I480 Paroxysmal atrial fibrillation: Secondary | ICD-10-CM | POA: Diagnosis not present

## 2023-11-24 DIAGNOSIS — E1111 Type 2 diabetes mellitus with ketoacidosis with coma: Secondary | ICD-10-CM | POA: Diagnosis not present

## 2023-11-24 DIAGNOSIS — J9621 Acute and chronic respiratory failure with hypoxia: Secondary | ICD-10-CM | POA: Diagnosis not present

## 2023-11-24 DIAGNOSIS — E43 Unspecified severe protein-calorie malnutrition: Secondary | ICD-10-CM | POA: Diagnosis not present

## 2023-11-24 DIAGNOSIS — J441 Chronic obstructive pulmonary disease with (acute) exacerbation: Secondary | ICD-10-CM | POA: Diagnosis not present

## 2023-11-24 DIAGNOSIS — I5033 Acute on chronic diastolic (congestive) heart failure: Secondary | ICD-10-CM | POA: Diagnosis not present

## 2023-11-24 DIAGNOSIS — I5021 Acute systolic (congestive) heart failure: Secondary | ICD-10-CM | POA: Diagnosis not present

## 2023-11-29 DIAGNOSIS — J9621 Acute and chronic respiratory failure with hypoxia: Secondary | ICD-10-CM | POA: Diagnosis not present

## 2023-11-29 DIAGNOSIS — J441 Chronic obstructive pulmonary disease with (acute) exacerbation: Secondary | ICD-10-CM | POA: Diagnosis not present

## 2023-11-29 DIAGNOSIS — J9622 Acute and chronic respiratory failure with hypercapnia: Secondary | ICD-10-CM | POA: Diagnosis not present

## 2023-11-29 DIAGNOSIS — I110 Hypertensive heart disease with heart failure: Secondary | ICD-10-CM | POA: Diagnosis not present

## 2023-11-29 DIAGNOSIS — I5021 Acute systolic (congestive) heart failure: Secondary | ICD-10-CM | POA: Diagnosis not present

## 2023-11-29 DIAGNOSIS — E1111 Type 2 diabetes mellitus with ketoacidosis with coma: Secondary | ICD-10-CM | POA: Diagnosis not present

## 2023-11-29 DIAGNOSIS — I480 Paroxysmal atrial fibrillation: Secondary | ICD-10-CM | POA: Diagnosis not present

## 2023-11-29 DIAGNOSIS — I5033 Acute on chronic diastolic (congestive) heart failure: Secondary | ICD-10-CM | POA: Diagnosis not present

## 2023-11-29 DIAGNOSIS — E43 Unspecified severe protein-calorie malnutrition: Secondary | ICD-10-CM | POA: Diagnosis not present

## 2023-11-30 DIAGNOSIS — I110 Hypertensive heart disease with heart failure: Secondary | ICD-10-CM | POA: Diagnosis not present

## 2023-11-30 DIAGNOSIS — E1111 Type 2 diabetes mellitus with ketoacidosis with coma: Secondary | ICD-10-CM | POA: Diagnosis not present

## 2023-11-30 DIAGNOSIS — I5033 Acute on chronic diastolic (congestive) heart failure: Secondary | ICD-10-CM | POA: Diagnosis not present

## 2023-11-30 DIAGNOSIS — J441 Chronic obstructive pulmonary disease with (acute) exacerbation: Secondary | ICD-10-CM | POA: Diagnosis not present

## 2023-11-30 DIAGNOSIS — J9622 Acute and chronic respiratory failure with hypercapnia: Secondary | ICD-10-CM | POA: Diagnosis not present

## 2023-11-30 DIAGNOSIS — I5021 Acute systolic (congestive) heart failure: Secondary | ICD-10-CM | POA: Diagnosis not present

## 2023-11-30 DIAGNOSIS — I480 Paroxysmal atrial fibrillation: Secondary | ICD-10-CM | POA: Diagnosis not present

## 2023-11-30 DIAGNOSIS — E43 Unspecified severe protein-calorie malnutrition: Secondary | ICD-10-CM | POA: Diagnosis not present

## 2023-11-30 DIAGNOSIS — J9621 Acute and chronic respiratory failure with hypoxia: Secondary | ICD-10-CM | POA: Diagnosis not present

## 2023-12-01 DIAGNOSIS — I5020 Unspecified systolic (congestive) heart failure: Secondary | ICD-10-CM | POA: Diagnosis not present

## 2023-12-01 DIAGNOSIS — J439 Emphysema, unspecified: Secondary | ICD-10-CM | POA: Diagnosis not present

## 2023-12-01 DIAGNOSIS — I10 Essential (primary) hypertension: Secondary | ICD-10-CM | POA: Diagnosis not present

## 2023-12-01 DIAGNOSIS — E119 Type 2 diabetes mellitus without complications: Secondary | ICD-10-CM | POA: Diagnosis not present

## 2023-12-01 DIAGNOSIS — I480 Paroxysmal atrial fibrillation: Secondary | ICD-10-CM | POA: Diagnosis not present

## 2023-12-02 DIAGNOSIS — I5033 Acute on chronic diastolic (congestive) heart failure: Secondary | ICD-10-CM | POA: Diagnosis not present

## 2023-12-02 DIAGNOSIS — I110 Hypertensive heart disease with heart failure: Secondary | ICD-10-CM | POA: Diagnosis not present

## 2023-12-02 DIAGNOSIS — J9622 Acute and chronic respiratory failure with hypercapnia: Secondary | ICD-10-CM | POA: Diagnosis not present

## 2023-12-02 DIAGNOSIS — J441 Chronic obstructive pulmonary disease with (acute) exacerbation: Secondary | ICD-10-CM | POA: Diagnosis not present

## 2023-12-02 DIAGNOSIS — E43 Unspecified severe protein-calorie malnutrition: Secondary | ICD-10-CM | POA: Diagnosis not present

## 2023-12-02 DIAGNOSIS — I480 Paroxysmal atrial fibrillation: Secondary | ICD-10-CM | POA: Diagnosis not present

## 2023-12-02 DIAGNOSIS — E1111 Type 2 diabetes mellitus with ketoacidosis with coma: Secondary | ICD-10-CM | POA: Diagnosis not present

## 2023-12-02 DIAGNOSIS — J9621 Acute and chronic respiratory failure with hypoxia: Secondary | ICD-10-CM | POA: Diagnosis not present

## 2023-12-02 DIAGNOSIS — I5021 Acute systolic (congestive) heart failure: Secondary | ICD-10-CM | POA: Diagnosis not present

## 2023-12-06 DIAGNOSIS — E1111 Type 2 diabetes mellitus with ketoacidosis with coma: Secondary | ICD-10-CM | POA: Diagnosis not present

## 2023-12-06 DIAGNOSIS — I5021 Acute systolic (congestive) heart failure: Secondary | ICD-10-CM | POA: Diagnosis not present

## 2023-12-06 DIAGNOSIS — J9622 Acute and chronic respiratory failure with hypercapnia: Secondary | ICD-10-CM | POA: Diagnosis not present

## 2023-12-06 DIAGNOSIS — R0902 Hypoxemia: Secondary | ICD-10-CM | POA: Diagnosis not present

## 2023-12-06 DIAGNOSIS — G4730 Sleep apnea, unspecified: Secondary | ICD-10-CM | POA: Diagnosis not present

## 2023-12-06 DIAGNOSIS — E43 Unspecified severe protein-calorie malnutrition: Secondary | ICD-10-CM | POA: Diagnosis not present

## 2023-12-06 DIAGNOSIS — I5033 Acute on chronic diastolic (congestive) heart failure: Secondary | ICD-10-CM | POA: Diagnosis not present

## 2023-12-06 DIAGNOSIS — J9621 Acute and chronic respiratory failure with hypoxia: Secondary | ICD-10-CM | POA: Diagnosis not present

## 2023-12-06 DIAGNOSIS — J441 Chronic obstructive pulmonary disease with (acute) exacerbation: Secondary | ICD-10-CM | POA: Diagnosis not present

## 2023-12-06 DIAGNOSIS — I110 Hypertensive heart disease with heart failure: Secondary | ICD-10-CM | POA: Diagnosis not present

## 2023-12-06 DIAGNOSIS — I480 Paroxysmal atrial fibrillation: Secondary | ICD-10-CM | POA: Diagnosis not present

## 2023-12-08 DIAGNOSIS — I110 Hypertensive heart disease with heart failure: Secondary | ICD-10-CM | POA: Diagnosis not present

## 2023-12-08 DIAGNOSIS — I5033 Acute on chronic diastolic (congestive) heart failure: Secondary | ICD-10-CM | POA: Diagnosis not present

## 2023-12-08 DIAGNOSIS — J9621 Acute and chronic respiratory failure with hypoxia: Secondary | ICD-10-CM | POA: Diagnosis not present

## 2023-12-08 DIAGNOSIS — I5021 Acute systolic (congestive) heart failure: Secondary | ICD-10-CM | POA: Diagnosis not present

## 2023-12-08 DIAGNOSIS — E43 Unspecified severe protein-calorie malnutrition: Secondary | ICD-10-CM | POA: Diagnosis not present

## 2023-12-08 DIAGNOSIS — I480 Paroxysmal atrial fibrillation: Secondary | ICD-10-CM | POA: Diagnosis not present

## 2023-12-08 DIAGNOSIS — J9622 Acute and chronic respiratory failure with hypercapnia: Secondary | ICD-10-CM | POA: Diagnosis not present

## 2023-12-08 DIAGNOSIS — J441 Chronic obstructive pulmonary disease with (acute) exacerbation: Secondary | ICD-10-CM | POA: Diagnosis not present

## 2023-12-08 DIAGNOSIS — E1111 Type 2 diabetes mellitus with ketoacidosis with coma: Secondary | ICD-10-CM | POA: Diagnosis not present

## 2023-12-13 DIAGNOSIS — I5021 Acute systolic (congestive) heart failure: Secondary | ICD-10-CM | POA: Diagnosis not present

## 2023-12-22 DIAGNOSIS — I480 Paroxysmal atrial fibrillation: Secondary | ICD-10-CM | POA: Diagnosis not present

## 2023-12-22 DIAGNOSIS — J9621 Acute and chronic respiratory failure with hypoxia: Secondary | ICD-10-CM | POA: Diagnosis not present

## 2023-12-22 DIAGNOSIS — I110 Hypertensive heart disease with heart failure: Secondary | ICD-10-CM | POA: Diagnosis not present

## 2023-12-22 DIAGNOSIS — I5033 Acute on chronic diastolic (congestive) heart failure: Secondary | ICD-10-CM | POA: Diagnosis not present

## 2023-12-22 DIAGNOSIS — J9622 Acute and chronic respiratory failure with hypercapnia: Secondary | ICD-10-CM | POA: Diagnosis not present

## 2023-12-22 DIAGNOSIS — I5021 Acute systolic (congestive) heart failure: Secondary | ICD-10-CM | POA: Diagnosis not present

## 2023-12-22 DIAGNOSIS — J441 Chronic obstructive pulmonary disease with (acute) exacerbation: Secondary | ICD-10-CM | POA: Diagnosis not present

## 2023-12-22 DIAGNOSIS — E43 Unspecified severe protein-calorie malnutrition: Secondary | ICD-10-CM | POA: Diagnosis not present

## 2023-12-22 DIAGNOSIS — E1111 Type 2 diabetes mellitus with ketoacidosis with coma: Secondary | ICD-10-CM | POA: Diagnosis not present

## 2023-12-27 DIAGNOSIS — R918 Other nonspecific abnormal finding of lung field: Secondary | ICD-10-CM | POA: Diagnosis not present

## 2023-12-27 DIAGNOSIS — J449 Chronic obstructive pulmonary disease, unspecified: Secondary | ICD-10-CM | POA: Diagnosis not present

## 2023-12-27 DIAGNOSIS — J9622 Acute and chronic respiratory failure with hypercapnia: Secondary | ICD-10-CM | POA: Diagnosis not present

## 2023-12-27 DIAGNOSIS — E1111 Type 2 diabetes mellitus with ketoacidosis with coma: Secondary | ICD-10-CM | POA: Diagnosis not present

## 2023-12-27 DIAGNOSIS — I5021 Acute systolic (congestive) heart failure: Secondary | ICD-10-CM | POA: Diagnosis not present

## 2023-12-27 DIAGNOSIS — J9621 Acute and chronic respiratory failure with hypoxia: Secondary | ICD-10-CM | POA: Diagnosis not present

## 2023-12-27 DIAGNOSIS — J441 Chronic obstructive pulmonary disease with (acute) exacerbation: Secondary | ICD-10-CM | POA: Diagnosis not present

## 2023-12-27 DIAGNOSIS — I480 Paroxysmal atrial fibrillation: Secondary | ICD-10-CM | POA: Diagnosis not present

## 2023-12-27 DIAGNOSIS — I110 Hypertensive heart disease with heart failure: Secondary | ICD-10-CM | POA: Diagnosis not present

## 2023-12-27 DIAGNOSIS — Z9981 Dependence on supplemental oxygen: Secondary | ICD-10-CM | POA: Diagnosis not present

## 2023-12-27 DIAGNOSIS — K219 Gastro-esophageal reflux disease without esophagitis: Secondary | ICD-10-CM | POA: Diagnosis not present

## 2023-12-28 DIAGNOSIS — E1111 Type 2 diabetes mellitus with ketoacidosis with coma: Secondary | ICD-10-CM | POA: Diagnosis not present

## 2023-12-29 DIAGNOSIS — I110 Hypertensive heart disease with heart failure: Secondary | ICD-10-CM | POA: Diagnosis not present

## 2023-12-29 DIAGNOSIS — E1111 Type 2 diabetes mellitus with ketoacidosis with coma: Secondary | ICD-10-CM | POA: Diagnosis not present

## 2023-12-29 DIAGNOSIS — J9622 Acute and chronic respiratory failure with hypercapnia: Secondary | ICD-10-CM | POA: Diagnosis not present

## 2023-12-29 DIAGNOSIS — J9621 Acute and chronic respiratory failure with hypoxia: Secondary | ICD-10-CM | POA: Diagnosis not present

## 2023-12-29 DIAGNOSIS — E43 Unspecified severe protein-calorie malnutrition: Secondary | ICD-10-CM | POA: Diagnosis not present

## 2023-12-29 DIAGNOSIS — I5033 Acute on chronic diastolic (congestive) heart failure: Secondary | ICD-10-CM | POA: Diagnosis not present

## 2023-12-29 DIAGNOSIS — I480 Paroxysmal atrial fibrillation: Secondary | ICD-10-CM | POA: Diagnosis not present

## 2023-12-29 DIAGNOSIS — J441 Chronic obstructive pulmonary disease with (acute) exacerbation: Secondary | ICD-10-CM | POA: Diagnosis not present

## 2023-12-29 DIAGNOSIS — I5021 Acute systolic (congestive) heart failure: Secondary | ICD-10-CM | POA: Diagnosis not present

## 2024-01-03 DIAGNOSIS — E1111 Type 2 diabetes mellitus with ketoacidosis with coma: Secondary | ICD-10-CM | POA: Diagnosis not present

## 2024-01-03 DIAGNOSIS — I110 Hypertensive heart disease with heart failure: Secondary | ICD-10-CM | POA: Diagnosis not present

## 2024-01-03 DIAGNOSIS — I5033 Acute on chronic diastolic (congestive) heart failure: Secondary | ICD-10-CM | POA: Diagnosis not present

## 2024-01-03 DIAGNOSIS — I5021 Acute systolic (congestive) heart failure: Secondary | ICD-10-CM | POA: Diagnosis not present

## 2024-01-03 DIAGNOSIS — J9622 Acute and chronic respiratory failure with hypercapnia: Secondary | ICD-10-CM | POA: Diagnosis not present

## 2024-01-03 DIAGNOSIS — I480 Paroxysmal atrial fibrillation: Secondary | ICD-10-CM | POA: Diagnosis not present

## 2024-01-03 DIAGNOSIS — J441 Chronic obstructive pulmonary disease with (acute) exacerbation: Secondary | ICD-10-CM | POA: Diagnosis not present

## 2024-01-03 DIAGNOSIS — E43 Unspecified severe protein-calorie malnutrition: Secondary | ICD-10-CM | POA: Diagnosis not present

## 2024-01-03 DIAGNOSIS — J9621 Acute and chronic respiratory failure with hypoxia: Secondary | ICD-10-CM | POA: Diagnosis not present

## 2024-01-19 DIAGNOSIS — I480 Paroxysmal atrial fibrillation: Secondary | ICD-10-CM | POA: Diagnosis not present

## 2024-01-19 DIAGNOSIS — J9622 Acute and chronic respiratory failure with hypercapnia: Secondary | ICD-10-CM | POA: Diagnosis not present

## 2024-01-19 DIAGNOSIS — J9621 Acute and chronic respiratory failure with hypoxia: Secondary | ICD-10-CM | POA: Diagnosis not present

## 2024-01-19 DIAGNOSIS — E1111 Type 2 diabetes mellitus with ketoacidosis with coma: Secondary | ICD-10-CM | POA: Diagnosis not present

## 2024-01-19 DIAGNOSIS — I5021 Acute systolic (congestive) heart failure: Secondary | ICD-10-CM | POA: Diagnosis not present

## 2024-01-19 DIAGNOSIS — J441 Chronic obstructive pulmonary disease with (acute) exacerbation: Secondary | ICD-10-CM | POA: Diagnosis not present

## 2024-01-19 DIAGNOSIS — I110 Hypertensive heart disease with heart failure: Secondary | ICD-10-CM | POA: Diagnosis not present

## 2024-03-01 DIAGNOSIS — J449 Chronic obstructive pulmonary disease, unspecified: Secondary | ICD-10-CM | POA: Diagnosis not present

## 2024-03-01 DIAGNOSIS — E119 Type 2 diabetes mellitus without complications: Secondary | ICD-10-CM | POA: Diagnosis not present

## 2024-03-01 DIAGNOSIS — Z79899 Other long term (current) drug therapy: Secondary | ICD-10-CM | POA: Diagnosis not present

## 2024-03-01 DIAGNOSIS — I10 Essential (primary) hypertension: Secondary | ICD-10-CM | POA: Diagnosis not present

## 2024-03-01 DIAGNOSIS — I480 Paroxysmal atrial fibrillation: Secondary | ICD-10-CM | POA: Diagnosis not present

## 2024-03-01 DIAGNOSIS — I5020 Unspecified systolic (congestive) heart failure: Secondary | ICD-10-CM | POA: Diagnosis not present
